# Patient Record
Sex: Female | Born: 1970 | Race: White | Hispanic: No | Marital: Married | Smoking: Never smoker
Health system: Southern US, Community
[De-identification: ages and names within clinical notes are randomized; demographics above are authoritative.]

## PROBLEM LIST (undated history)

## (undated) DIAGNOSIS — R112 Nausea with vomiting, unspecified: Secondary | ICD-10-CM

## (undated) DIAGNOSIS — Z8489 Family history of other specified conditions: Secondary | ICD-10-CM

## (undated) DIAGNOSIS — T8859XA Other complications of anesthesia, initial encounter: Secondary | ICD-10-CM

## (undated) DIAGNOSIS — Z973 Presence of spectacles and contact lenses: Secondary | ICD-10-CM

## (undated) DIAGNOSIS — K219 Gastro-esophageal reflux disease without esophagitis: Secondary | ICD-10-CM

## (undated) DIAGNOSIS — Z8719 Personal history of other diseases of the digestive system: Secondary | ICD-10-CM

## (undated) DIAGNOSIS — Z9889 Other specified postprocedural states: Secondary | ICD-10-CM

## (undated) DIAGNOSIS — T4145XA Adverse effect of unspecified anesthetic, initial encounter: Secondary | ICD-10-CM

## (undated) DIAGNOSIS — T753XXA Motion sickness, initial encounter: Secondary | ICD-10-CM

## (undated) DIAGNOSIS — D128 Benign neoplasm of rectum: Secondary | ICD-10-CM

## (undated) HISTORY — PX: SIGMOIDOSCOPY: SUR1295

## (undated) HISTORY — PX: TRANSANAL EXCISION OF RECTAL MASS: SHX6134

---

## 1998-07-30 ENCOUNTER — Inpatient Hospital Stay (HOSPITAL_COMMUNITY): Admission: AD | Admit: 1998-07-30 | Discharge: 1998-08-01 | Payer: Self-pay | Admitting: Obstetrics and Gynecology

## 1998-09-10 ENCOUNTER — Other Ambulatory Visit: Admission: RE | Admit: 1998-09-10 | Discharge: 1998-09-10 | Payer: Self-pay | Admitting: Obstetrics and Gynecology

## 2001-08-08 ENCOUNTER — Other Ambulatory Visit: Admission: RE | Admit: 2001-08-08 | Discharge: 2001-08-08 | Payer: Self-pay | Admitting: Gynecology

## 2001-12-05 ENCOUNTER — Observation Stay (HOSPITAL_COMMUNITY): Admission: AD | Admit: 2001-12-05 | Discharge: 2001-12-06 | Payer: Self-pay | Admitting: Gynecology

## 2001-12-06 ENCOUNTER — Encounter: Payer: Self-pay | Admitting: Gynecology

## 2002-01-14 ENCOUNTER — Inpatient Hospital Stay (HOSPITAL_COMMUNITY): Admission: AD | Admit: 2002-01-14 | Discharge: 2002-01-17 | Payer: Self-pay | Admitting: Gynecology

## 2002-01-18 ENCOUNTER — Encounter: Admission: RE | Admit: 2002-01-18 | Discharge: 2002-02-17 | Payer: Self-pay | Admitting: Gynecology

## 2002-02-18 ENCOUNTER — Encounter: Admission: RE | Admit: 2002-02-18 | Discharge: 2002-03-20 | Payer: Self-pay | Admitting: Gynecology

## 2002-03-01 ENCOUNTER — Other Ambulatory Visit: Admission: RE | Admit: 2002-03-01 | Discharge: 2002-03-01 | Payer: Self-pay | Admitting: Gynecology

## 2002-10-01 ENCOUNTER — Encounter (INDEPENDENT_AMBULATORY_CARE_PROVIDER_SITE_OTHER): Payer: Self-pay | Admitting: Specialist

## 2002-10-01 ENCOUNTER — Ambulatory Visit (HOSPITAL_COMMUNITY): Admission: RE | Admit: 2002-10-01 | Discharge: 2002-10-01 | Payer: Self-pay | Admitting: Gastroenterology

## 2003-04-18 ENCOUNTER — Ambulatory Visit (HOSPITAL_COMMUNITY): Admission: RE | Admit: 2003-04-18 | Discharge: 2003-04-18 | Payer: Self-pay | Admitting: Gastroenterology

## 2003-04-18 ENCOUNTER — Encounter (INDEPENDENT_AMBULATORY_CARE_PROVIDER_SITE_OTHER): Payer: Self-pay | Admitting: *Deleted

## 2003-06-12 ENCOUNTER — Encounter (INDEPENDENT_AMBULATORY_CARE_PROVIDER_SITE_OTHER): Payer: Self-pay | Admitting: *Deleted

## 2003-06-12 ENCOUNTER — Ambulatory Visit (HOSPITAL_COMMUNITY): Admission: RE | Admit: 2003-06-12 | Discharge: 2003-06-12 | Payer: Self-pay | Admitting: General Surgery

## 2003-10-01 ENCOUNTER — Ambulatory Visit (HOSPITAL_COMMUNITY): Admission: RE | Admit: 2003-10-01 | Discharge: 2003-10-01 | Payer: Self-pay | Admitting: Gastroenterology

## 2003-10-01 ENCOUNTER — Encounter (INDEPENDENT_AMBULATORY_CARE_PROVIDER_SITE_OTHER): Payer: Self-pay | Admitting: *Deleted

## 2004-04-12 ENCOUNTER — Ambulatory Visit (HOSPITAL_COMMUNITY): Admission: RE | Admit: 2004-04-12 | Discharge: 2004-04-12 | Payer: Self-pay | Admitting: Gastroenterology

## 2004-04-12 ENCOUNTER — Encounter (INDEPENDENT_AMBULATORY_CARE_PROVIDER_SITE_OTHER): Payer: Self-pay | Admitting: *Deleted

## 2004-05-24 ENCOUNTER — Ambulatory Visit (HOSPITAL_COMMUNITY): Admission: RE | Admit: 2004-05-24 | Discharge: 2004-05-24 | Payer: Self-pay | Admitting: General Surgery

## 2004-05-24 ENCOUNTER — Encounter (INDEPENDENT_AMBULATORY_CARE_PROVIDER_SITE_OTHER): Payer: Self-pay | Admitting: *Deleted

## 2004-10-27 ENCOUNTER — Ambulatory Visit (HOSPITAL_COMMUNITY): Admission: RE | Admit: 2004-10-27 | Discharge: 2004-10-27 | Payer: Self-pay | Admitting: Gastroenterology

## 2005-01-03 ENCOUNTER — Inpatient Hospital Stay (HOSPITAL_COMMUNITY): Admission: AD | Admit: 2005-01-03 | Discharge: 2005-01-03 | Payer: Self-pay | Admitting: Gynecology

## 2005-01-11 ENCOUNTER — Other Ambulatory Visit: Admission: RE | Admit: 2005-01-11 | Discharge: 2005-01-11 | Payer: Self-pay | Admitting: Gynecology

## 2005-04-27 ENCOUNTER — Encounter: Admission: RE | Admit: 2005-04-27 | Discharge: 2005-04-27 | Payer: Self-pay | Admitting: Obstetrics and Gynecology

## 2005-06-25 ENCOUNTER — Inpatient Hospital Stay (HOSPITAL_COMMUNITY): Admission: AD | Admit: 2005-06-25 | Discharge: 2005-06-25 | Payer: Self-pay | Admitting: Obstetrics & Gynecology

## 2005-07-08 ENCOUNTER — Inpatient Hospital Stay (HOSPITAL_COMMUNITY): Admission: AD | Admit: 2005-07-08 | Discharge: 2005-07-10 | Payer: Self-pay | Admitting: Obstetrics and Gynecology

## 2005-12-21 ENCOUNTER — Encounter (INDEPENDENT_AMBULATORY_CARE_PROVIDER_SITE_OTHER): Payer: Self-pay | Admitting: Specialist

## 2005-12-21 ENCOUNTER — Ambulatory Visit (HOSPITAL_COMMUNITY): Admission: RE | Admit: 2005-12-21 | Discharge: 2005-12-21 | Payer: Self-pay | Admitting: Gastroenterology

## 2006-08-07 ENCOUNTER — Ambulatory Visit (HOSPITAL_COMMUNITY): Admission: RE | Admit: 2006-08-07 | Discharge: 2006-08-07 | Payer: Self-pay | Admitting: Gastroenterology

## 2010-07-23 ENCOUNTER — Encounter: Admission: RE | Admit: 2010-07-23 | Discharge: 2010-07-23 | Payer: Self-pay | Admitting: Chiropractor

## 2011-03-11 NOTE — Op Note (Signed)
Angela Knight, Angela Knight                 ACCOUNT NO.:  [id number]   MEDICAL RECORD NO.:  [id number]          PATIENT TYPE:  AMB   LOCATION:  ENDO                         FACILITY:  [facility]   PHYSICIAN:  Anselmo Rod, M.D.  DATE OF BIRTH:  08-16-1971   DATE OF PROCEDURE:  08/07/2006  DATE OF DISCHARGE:                                 OPERATIVE REPORT   PROCEDURE PERFORMED:  Diagnostic flexible sigmoidoscopy.   ENDOSCOPIST:  Anselmo Rod, M.D.   INSTRUMENT USED:  Olympus video colonoscope.   INDICATION FOR PROCEDURE:  A 40 year old white female with a history of  tubulovillous adenoma.  The patient has had a transanal resection in the  past.  Rule out recurrent polyps, masses, etc.   PREPROCEDURE PREPARATION:  Informed consent was procured from the patient.  The patient was fasted for 4 hours prior to the procedure and prepped with a  bottle of magnesium citrate the night prior to the procedure.  The risks and  benefits of the procedure were discussed with her in great detail.   PREPROCEDURE PHYSICAL:  VITAL SIGNS:  The patient had stable vital signs.  NECK:  Supple.  CHEST:  Clear to auscultation.  S1, S2 regular.  ABDOMEN:  Soft with normal bowel sounds.   DESCRIPTION OF PROCEDURE:  The patient was placed in the left lateral  decubitus position.  The patient chose to have the procedure without  sedation.  Once she was adequately positioned, the Olympus video colonoscope  was advanced from the rectum to 90 cm without difficulty.  A scar was noted  at the anal verge from previous transanal resection.  The rest of the  colonic mucosa up to 90 cm appeared healthy.  There was solid stool beyond  this point, and therefore the procedure was completed at 90 cm.  No masses,  polyps, erosions, ulcerations or diverticula were seen.  Retroflexed view in  the rectum revealed no abnormalities except for the scar from the previous  surgery as mentioned above.   IMPRESSION:  Normal exam up to  90 cm except for a scar at the anal verge.  No masses, polyps, erosions, ulcerations or diverticula seen.   RECOMMENDATIONS:  1. Continue a high-fiber diet with liberal fluid intake.  2. Repeat flexible sigmoidoscopy in the next 6 months unless the patient      develops any abnormal symptoms prior to that, in which case she should      contact the office immediately for further recommendations.      Anselmo Rod, M.D.  Electronically Signed     JNM/MEDQ  D:  08/08/2006  T:  08/10/2006  Job:  [id number]   cc:   Marjory Lies, M.D.  Adolph Pollack, M.D.

## 2011-03-11 NOTE — Op Note (Signed)
NAME:  ANGALENA, COUSINEAU                           ACCOUNT NO.:  [id number]   MEDICAL RECORD NO.:  [id number]                   PATIENT TYPE:  AMB   LOCATION:  DAY                                  FACILITY:  [facility]   PHYSICIAN:  Adolph Pollack, M.D.            DATE OF BIRTH:  1971-04-15   DATE OF PROCEDURE:  06/12/2003  DATE OF DISCHARGE:                                 OPERATIVE REPORT   PREOPERATIVE DIAGNOSIS:  Tubular adenoma of the rectum.   POSTOPERATIVE DIAGNOSIS:  Tubular adenoma of the rectum.   OPERATION/PROCEDURE:  1. Transrectal ultrasound.  2. Transanal excision of polypoid rectal mass.   SURGEON:  Adolph Pollack, M.D.   ANESTHESIA:  General.   INDICATIONS:  Angela Knight is a 40 year old female who about seven months ago  underwent a transanal resection of tubular adenoma of the rectum  encompassing 25% of its circumference.  She had a six-month followup and was  noted to have a recurrence.  She now presents for transrectal ultrasound and  reexcision.   DESCRIPTION OF PROCEDURE:  She was seen in the holding area and brought to  the operating room supine on the stretcher. A general anesthetic was  administered and she was turned into the prone position on the operating  table with padding placed in the appropriate areas.  The buttocks were then  retracted with tape and she was placed in the jackknife position.  Perianal  area was then isolated.  Digital rectal exam was performed.  In the anterior  region I could feel the irregular mucosa of the rectum.  Using the rectal  probe, transrectal ultrasound was performed anteriorly from approximately  the 10 o'clock to 2 o'clock position.  In that area is an irregular mucosal-  type lesion.  It did not go deep to the mucosa.  I subsequently removed the  transrectal ultrasound probe.  I then sterilely prepped and draped the  perianal area.  Anoscope was introduced and a flat polypoid lesion was noted  from about the 10  o'clock to 2 o'clock position in spotty areas.  Using 0.5%  Marcaine with epinephrine, I anesthetized the submucosal area.  I then used  the cautery to perform excision of this area from the 10 o'clock to 1  o'clock position, repositoned the anoscope and excised the rest of the area  by way of mucosal excision.  I examined the area fairly thoroughly and saw  no other evidence of polypoid lesions.   Next, I controlled bleeding using electrocautery.  I then inspected the area  multiple time.  I injected more 0.5% Marcaine with epinephrine in the  submucosal region.  After five minutes of observation, hemostasis was noted  to be adequate.  I then placed a Gelfoam on the area.  A dry dressing was  placed over the rectum.   The specimen was sent to pathology.  She was then  placed supine on the  stretcher, extubated and taken to the recovery room in satisfactory  condition.  There are no apparent complications and she tolerated the  procedure well.   Postoperative instructions will be given to her as well as postoperative  pain medicine and medicine for nausea.  I will see her back in the office in  about two weeks.                                               Adolph Pollack, M.D.    Kari Baars  D:  06/12/2003  T:  06/12/2003  Job:  [id number]   cc:   Anselmo Rod, M.D.  [address]  [address]  [place]  [place] [address]  Fax: [phone]

## 2011-03-11 NOTE — Op Note (Signed)
   NAME:  Angela Knight, Angela Knight                           ACCOUNT NO.:  [id number]   MEDICAL RECORD NO.:  [id number]                   PATIENT TYPE:  AMB   LOCATION:  ENDO                                 FACILITY:  [facility]   PHYSICIAN:  Anselmo Rod, M.D.               DATE OF BIRTH:  03/05/1971   DATE OF PROCEDURE:  04/18/2003  DATE OF DISCHARGE:  04/18/2003                                 OPERATIVE REPORT   PROCEDURE PERFORMED:  Flexible sigmoidoscopy with biopsies.   ENDOSCOPIST:  Anselmo Rod, M.D.   INSTRUMENT USED:  Olympus videocolonoscope.   INDICATION FOR THE PROCEDURE:  History of a tubulovillous adenoma removed  transanally by Dr. Abbey Chatters in 1/04, rule out recurrent lesions, six-month  flexible sigmoidoscopy being done.   PREPROCEDURE PREPARATION:  Informed consent was procured from the patient.  The patient was fasted for eight hours prior to the procedure and prepped  with a bottle of MiraLax night prior to the procedure.   PREPROCEDURE PHYSICAL:  VITAL SIGNS:  Stable.  NECK:  Supple.  CHEST:  Clear to auscultation.  S1 and S2 regular.  ABDOMEN:  Soft with normal bowel sounds.   DESCRIPTION OF THE PROCEDURE:  The patient was placed in the left lateral  decubitus position.  No sedation was used.  Once the patient was adequately  positioned the Olympus videocolonoscope was advanced from the rectum to 50  cm without difficulty.  There was nodular mucosa seen at the anal verge.  It  was biopsied for pathology.  This may indicate recurrence of her  tubulovillous adenoma removed surgically less than six months ago.  The rest  of the mucosa up to 50 cm appeared normal.  Retroflexion revealed no  abnormalities.  The patient tolerated the procedure well without  complications.   IMPRESSION:  Irregular mucosa with nodularity noticed at anal verge,  biopsies done, question recurrent tubulovillous adenoma.    RECOMMENDATIONS:  Await pathology results, outpatient followup  within the  next week for further recommendations.                                               Anselmo Rod, M.D.    JNM/MEDQ  D:  04/18/2003  T:  04/20/2003  Job:  [id number]   cc:   Adolph Pollack, M.D.  [address]  [place]  [place] [address]  Fax: [phone]   Teena Irani. Arlyce Dice, M.D.  [address]  [place]  [place] [address]  Fax: [phone]

## 2011-03-11 NOTE — Op Note (Signed)
NAME:  Angela Knight, Angela Knight                           ACCOUNT NO.:  [id number]   MEDICAL RECORD NO.:  [id number]                   PATIENT TYPE:  AMB   LOCATION:  DAY                                  [facility]ACILITY:  [facility]   PHYSICIAN:  Adolph Pollack, M.D.            DATE OF BIRTH:  04/13/1971   DATE OF PROCEDURE:  05/24/2004  DATE OF DISCHARGE:                                 OPERATIVE REPORT   PREOPERATIVE DIAGNOSIS:  Tubulovillous rectal adenoma.   POSTOPERATIVE DIAGNOSIS:  Tubulovillous rectal adenoma.   PROCEDURE:  Transanal excision of tubulovillous rectal adenoma.   SURGEON:  Adolph Pollack, M.D.   ANESTHESIA:  General plus local.   INDICATIONS:  Ms. Beauchesne is a 40 year old female, status post excision of  tubulovillous adenoma of the rectum approximately one year ago.  A repeat  sigmoidoscopy was performed here recently, and a small lesion was seen at  the anal verge.  I saw her in the office, and on anoscopy, she has another  villous-appearing adenoma at the anal verge, more distal than the others  that she has had in the past.  She now presents for excision.  The procedure  and risks were discussed with her preoperatively.   TECHNIQUE:  She is seen in the holding area and then brought to the  operating room.  Placed supine on the operating room table.  General  anesthetic was administered.  She was then placed in the lithotomy position.  The perianal area was sterilely prepped and draped.  Digital rectal exam was  performed, and I could feel the irregularity at the 9 and 10 o'clock  position.  I then introduced an anoscope and injected some local anesthetic  into this region, consisting of 0.5% Marcaine with epinephrine.  I was able  to grasp the polyp with Allis forceps and using electrocautery, I excised  what appeared to be about a 2 cm polyp.  I took the mucosa and some of the  submucosa.  I then looked at the periphery and took off some of the mucosa  in the  periphery around the polyp all the way down to the anal verge region  and up into the rectal area.  I sent all of these specimens to pathology.   I then inspected the area, and bleeding points were controlled with  electrocautery.  I injected more of a local anesthetic from the 6 o'clock  and 12 o'clock positions superficially and deep.  I then placed a piece of  gel foam over the excision area.  Hemostasis was adequate.  A bulky dressing  was then applied.   She tolerated the procedure well without any apparent complications and was  taken to the recovery room in satisfactory condition.  We have discussed the  perioperative and postoperative care preoperatively.  We will send her home  with Ultram for pain and have her come back and see me  in 1-2 weeks.                                               Adolph Pollack, M.D.    Kari Baars  D:  05/24/2004  T:  05/24/2004  Job:  [id number]   cc:   Anselmo Rod, M.D.  [address]  [address]  [place]  [place] [address]  Fax: [phone]

## 2011-03-11 NOTE — Op Note (Signed)
NAME:  Angela Knight, Angela Knight                           ACCOUNT NO.:  [id number]   MEDICAL RECORD NO.:  [id number]                   PATIENT TYPE:  AMB   LOCATION:  ENDO                                 FACILITY:  [facility]   PHYSICIAN:  Anselmo Rod, M.D.               DATE OF BIRTH:  Jun 01, 1971   DATE OF PROCEDURE:  10/01/2003  DATE OF DISCHARGE:  10/01/2003                                 OPERATIVE REPORT   PROCEDURE:  Flexible sigmoidoscopy with biopsies.   ENDOSCOPIST:  Anselmo Rod, M.D.   INSTRUMENT USED:  Olympus video colonoscope.   INDICATIONS FOR PROCEDURE:  A 40 year old white female with a history of  tubular adenoma removed by Dr. Abbey Chatters from the rectum earlier this year,  undergoing a repeat flexible sigmoidoscopy to rule out recurrence.   PREPROCEDURE PREPARATION:  Informed consent was procured from the patient.  The patient was fasted for eight hours prior to the procedure and prepped  with a bottle of MiraLax the night prior to the procedure.   PREPROCEDURE PHYSICAL EXAMINATION:  VITAL SIGNS:  Stable.  NECK:  Supple.  CHEST:  Clear to auscultation.  S1 and S2 regular.  ABDOMEN:  Soft with normal bowel sounds.   DESCRIPTION OF PROCEDURE:  The patient was placed in the left lateral  decubitus position and no sedation was used.  Once the patient was  adequately positioned, the Olympus video colonoscope was advanced from the  rectum to 40 cm without difficulty.  The patient had a well-healed scar to  the anal verge.  A small nodular area was identified close to the anal  verge.  This was probably granulation tissue.  This was biopsied for  pathology.  Retroflexion showed mild irregularity above the anal verge which  may represent residual adenomatous tissue.  This was biopsied for pathology  as well.  The patient had no other abnormalities noted and the entire rectal  mucosa appeared healthy except for the small nodularity at the anal verge  close to the surgical scar  where biopsies were done.  The patient tolerated  the procedure well without complications.   IMPRESSION:  Small patch of irregular mucosa and nodularity at site of  previous surgery.  Rule out recurrent adenoma.  Biopsies done and results  pending.   RECOMMENDATIONS:  Await pathology results.  Repeat flexible sigmoidoscopy  and possible Argon ablation if the patient has recurrence of an adenomatous  polyp.                                               Anselmo Rod, M.D.    JNM/MEDQ  D:  10/02/2003  T:  10/03/2003  Job:  [id number]   cc:   Adolph Pollack, M.D.  [address]  [place]  [place] [address]  Fax: [phone]   Elizabeth Palau, F.N.P.   Timothy P. Audie Box, M.D.  [address]  [place]  [place] [address]  Fax: [phone]

## 2011-03-11 NOTE — Op Note (Signed)
NAME:  CAM, HARNDEN                           ACCOUNT NO.:  [id number]   MEDICAL RECORD NO.:  [id number]                   PATIENT TYPE:  AMB   LOCATION:  ENDO                                 FACILITY:  [facility]   PHYSICIAN:  Anselmo Rod, M.D.               DATE OF BIRTH:  31-Dec-1970   DATE OF PROCEDURE:  10/02/2002  DATE OF DISCHARGE:                                 OPERATIVE REPORT   PROCEDURE PERFORMED:  Colonoscopy with snare polypectomy times one and cold  biopsies times 10.   ENDOSCOPIST:  Charna Elizabeth, M.D.   INSTRUMENT USED:  Olympus adjustable pediatric colonoscope.   INDICATIONS FOR PROCEDURE:  The patient is a 40 year old white female with a  history of change in bowel habits and rectal bleeding.  Rule out  inflammatory bowel disease, polyps, masses, etc.   PREPROCEDURE PREPARATION:  Informed consent was procured from the patient.  The patient was fasted for eight hours prior to the procedure and prepped  with a bottle of magnesium citrate and a gallon of NuLytely the night prior  to the procedure.   PREPROCEDURE PHYSICAL:  The patient had stable vital signs.  Neck supple.  Chest clear to auscultation.  S1 and S2 regular.  Abdomen soft with normal  bowel sounds.   DESCRIPTION OF PROCEDURE:  The patient was placed in left lateral decubitus  position and sedated with 30 mg of Demerol and 2 mg of Versed intravenously.  Once the patient was adequately sedated and maintained on low flow oxygen  and continuous cardiac monitoring, the Olympus video colonoscope was  advanced from the rectum to the cecum and terminal ileum without difficulty.  A nodular mass was seen at the anal verge.  This was biopsied for pathology.  A small polyp was seen close to the nodular mass.  This was removed by snare  polypectomy.  The appearance of his nodular area could be akin to a villous  adenoma.  The rest of the colonic mucosa up to the terminal ileum appeared  normal.  The mass in the  rectum was at the anal verge.  The patient  tolerated the procedure well without complications.   IMPRESSION:  1. Nodular lesion at the anal verge with a small polyp close to it.     Biopsies done from the nodular mass and polypectomy done of small polyp     present near the nodular mass.  2. Otherwise normal-appearing colon up to the terminal ileum.    RECOMMENDATIONS:  1. Await pathology results.  2. Avoid all nonsteroidals including aspirin for now.  3. Possible surgical evaluation depending on the pathology results.  Anselmo Rod, M.D.    JNM/MEDQ  D:  10/02/2002  T:  10/02/2002  Job:  [id number]   cc:   Marcial Pacas P. Audie Box, M.D.  [address], [address]  [place]  [place] [address]  Fax: [phone]

## 2011-03-11 NOTE — Op Note (Signed)
NAME:  Angela Knight, Angela Knight                 ACCOUNT NO.:  [id number]   MEDICAL RECORD NO.:  [id number]          PATIENT TYPE:  AMB   LOCATION:  ENDO                         FACILITY:  [facility]   PHYSICIAN:  Anselmo Rod, M.D.  DATE OF BIRTH:  26-Apr-1971   DATE OF PROCEDURE:  10/27/2004  DATE OF DISCHARGE:                                 OPERATIVE REPORT   PROCEDURE PERFORMED:  Flexible sigmoidoscopy.   ENDOSCOPIST:  Anselmo Rod, M.D.   INSTRUMENT USED:  Olympus video colonoscope.   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  The patient  had transanal excision by Dr. Abbey Chatters earlier last year and is undergoing  a repeat flexible sigmoidoscopy.   PRE-PROCEDURE PREPARATION:  Informed consent was procured from the patient.  The patient was fasted for eight hours prior to the procedure and prepped  with a bottle of MiraLax the night prior to the procedure.   PHYSICAL EXAMINATION:  VITAL SIGNS:  Stable.  NECK:  Supple.  CHEST:  Clear to auscultation.  CARDIAC:  S1, S2 regular.  ABDOMEN:  Soft, with normal bowel sounds.   DESCRIPTION OF THE PROCEDURE:  The patient was placed in the left lateral  decubitus position.  No sedation was used, as the patient opted to have the  procedure without sedation.  Therefore, no cardiac monitoring or oxygen was  used.  Once the patient was positioned on the left lateral side, the Olympus  video colonoscope was advanced into the rectum to 20 cm without difficulty.  There was no evidence of a villous adenoma or abnormalities in the rectum.  The mucosa appeared healthy, with no nodularity or evidence of a polyp.  The  mucosa up to 20 cm appeared normal.  Retroflexion in the rectum revealed a  healthy dentate line, with no evidence of recurrence.   IMPRESSION:  Normal exam up to 20 cm.   RECOMMENDATIONS:  Repeat flexible sigmoidoscopy has been advised in the  next  six months or earlier if need arises.  The patient should call the office if  she has any abnormal symptoms.      Jyot   JNM/MEDQ  D:  10/28/2004  T:  10/28/2004  Job:  [id number]   cc:   Adolph Pollack, M.D.  [address]  [place]  [place] [address]  Fax: [phone]   Nadyne Coombes. Fontaine, M.D.  Fax: [phone]

## 2011-03-11 NOTE — Op Note (Signed)
NAME:  Angela Knight, Angela Knight                           ACCOUNT NO.:  [id number]   MEDICAL RECORD NO.:  [id number]                   PATIENT TYPE:  AMB   LOCATION:  ENDO                                 FACILITY:  [facility]   PHYSICIAN:  Anselmo Rod, M.D.               DATE OF BIRTH:  1971-05-17   DATE OF PROCEDURE:  04/12/2004  DATE OF DISCHARGE:                                 OPERATIVE REPORT   PROCEDURE:  Flexible sigmoidoscopy with biopsies.   ENDOSCOPIST:  Charna Elizabeth, M.D.   INSTRUMENT USED:  Olympus video colonoscope.   INDICATIONS FOR PROCEDURE:  40 year old white female with a history of  tubular villous adenoma in the rectum undergoing a repeat flexible  sigmoidoscopy to rule out recurrence.   PREPROCEDURE PREPARATION:  Informed consent was obtained from the patient.  The patient was fasted for eight hours prior to the procedure and prepped  with a bottle of MiraLax the night prior to the procedure.   PREPROCEDURE PHYSICAL:  Patient with stable vital signs.  Neck supple.  Chest clear to auscultation.  S1 and S2 regular.  Abdomen soft with normal  bowel sounds.   DESCRIPTION OF PROCEDURE:  The patient was placed in the left lateral  decubitus position, no sedation was used.  Once the patient was adequately  positioned, the Olympus video colonoscope was advanced into the rectum to 25  cm without difficulty.  The patient had an excellent prep.  A small nodular  lesion was seen at the anal verge which was biopsied for pathology.  The  patient experienced some pain with the biopsies and, therefore, no further  intervention was undertaken.  Retroflexion in the rectum revealed small  nodular lesion at the verge.  The patient tolerated the procedure well  without immediate complications.   IMPRESSION:  Small nodular lesion at the anal verge, biopsies done, results  pending.   RECOMMENDATIONS:  1. Await pathology results, further recommendations made after the results     have been  procured.  2. Avoid nonsteroidals for now.                                               Anselmo Rod, M.D.    JNM/MEDQ  D:  04/12/2004  T:  04/12/2004  Job:  [id number]   cc:   Marcial Pacas P. Audie Box, M.D.  [address]  [place]  [place] [address]  Fax: [phone]   Adolph Pollack, M.D.  [address]  [place]  [place] [address]  Fax: [phone]

## 2011-03-11 NOTE — Op Note (Signed)
NAMESILA, SARSFIELD                 ACCOUNT NO.:  [id number]   MEDICAL RECORD NO.:  [id number]          PATIENT TYPE:  AMB   LOCATION:  ENDO                         FACILITY:  [facility]   PHYSICIAN:  Anselmo Rod, M.D.  DATE OF BIRTH:  April 10, 1971   DATE OF PROCEDURE:  12/21/2005  DATE OF DISCHARGE:  12/21/2005                                 OPERATIVE REPORT   PROCEDURE:  Flexible sigmoidoscopy up to 20 cm with snare polypectomy x2.   ENDOSCOPIST:  Anselmo Rod, M.D.   INSTRUMENT USED:  Olympus videocolonoscope.   INDICATIONS FOR PROCEDURE:  The patient is a 40 year old white female with a  history of tubulovillous adenomas removed from the rectum in the past,  undergoing a repeat flexible sigmoidoscopy for surveillance purposes and  rule out recurrent polyps, etc.   PREPROCEDURE PREPARATION:  Informed consent was procured from the patient.  The patient was fasted for four hours prior to the procedure and prepped  with MiraLax the night prior to the procedure.  The risks and benefits of  the procedure were discussed with her in great detail.   PREPROCEDURE PHYSICAL EXAMINATION:  VITAL SIGNS:  The patient had stable  vital signs.  NECK:  Supple.  CHEST:  Clear to auscultation.  S1 and S2 regular.  ABDOMEN:  Soft with normal bowel sounds.   DESCRIPTION OF PROCEDURE:  The patient was placed in the left lateral  decubitus position.  She chose to do the procedure without sedation.  Once  the patient was adequately positioned, the Olympus videocolonoscope was  advanced from the rectum to 20 cm without difficulty.  Two well-healed scars  were seen at the anal verge from previous polypectomy/surgery for a  transanal excision of a rectal lesion.  Two nodular lesions were snared from  close to the anal verge.  These were better appreciated on retroflexion in  the rectum.  The rest of the exam was unremarkable.   IMPRESSION:  Two nodular lesions removed by snare polypectomy from the  anal  verge.  Otherwise, unremarkable exam up to 20 cm.   RECOMMENDATIONS:  1.  Await pathology results.  2.  Avoid all nonsteroidals for now.  3.  Further recommendations depending on pathology results.      Anselmo Rod, M.D.  Electronically Signed     JNM/MEDQ  D:  12/22/2005  T:  12/23/2005  Job:  [id number]   cc:   Marjory Lies, M.D.  Fax: [phone]   Adolph Pollack, M.D.  [address]  [place]  [place] [address]

## 2011-03-11 NOTE — H&P (Signed)
Angela Knight, Angela Knight                 ACCOUNT NO.:  [id number]   MEDICAL RECORD NO.:  [id number]          PATIENT TYPE:  INP   LOCATION:  9162                          FACILITY:  [facility]   PHYSICIAN:  Lovenia Kim, M.D.DATE OF BIRTH:  1971-06-29   DATE OF ADMISSION:  07/08/2005  DATE OF DISCHARGE:                                HISTORY & PHYSICAL   HISTORY OF PRESENT ILLNESS:  The patient is a 40 year old white female G8,  P5, who presents at [redacted] weeks gestation in active labor.   MEDICATIONS:  Zofran and stool softener.   ALLERGIES:  Codeine, Sudafed, and Monistat.   Prenatal lab data:  Blood type O positive, Rubella immune, hepatitis and  HIV nonreactive.   Pregnancy history remarkable for five vaginal deliveries with two  pregnancies lost.   FAMILY HISTORY:  Bipolar disorder, prostate and skin cancer, and heart  disease.   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   PHYSICAL EXAMINATION:  GENERAL:  She is a well developed, well nourished white female in no acute  distress.  HEENT:  Normal.  LUNGS:  Clear.  HEART:  Regular rate and rhythm.  ABDOMEN:  Soft, gravid, nontender, estimated fetal weight about 8 pounds.   The cervix is 4-5 cm, 80% effaced, vertex 0 station.   IMPRESSION:  1.  38 week OB.  2.  Glioblastoma multiforme, stable.  3.  Active labor.   PLAN:  Artificial rupture of membranes, epidural, attempt at vaginal  delivery.     Lovenia Kim, M.D.  Electronically Signed    RJT/MEDQ  D:  07/08/2005  T:  07/08/2005  Job:  [id number]

## 2011-03-11 NOTE — Discharge Summary (Signed)
[facility]  Patient:    Angela Knight, Angela Knight Visit Number: [id number] MRN: [id number]          Service Type: OBS Location: 910A 9114 [id number] Attending Physician:  Merrily Pew Dictated by:   Antony Contras, [facility] Admit Date:  01/14/2002 Discharge Date: 01/17/2002                             Discharge Summary  DISCHARGE DIAGNOSES:          1. Intrauterine pregnancy at 37 weeks.                               2. Spontaneous onset of labor.  PROCEDURE:                    Normal spontaneous vaginal delivery of viable infant over intact perineum with repair of first to second degree vaginal perineal tear.  HISTORY OF PRESENT ILLNESS:   The patient is a 40 year old, gravida 9, para 4-0-2-4, LMP April 29, 2001, [facility] February 03, 2002.  Prenatal risk factors include previous history of GDM, positive beta strep in previous pregnancies.  PRENATAL LABORATORY DATA:     Blood type O positive, antibody screen negative. RPR, HBSAG, and HIV nonreactive.  HOSPITAL COURSE:              The patient was admitted on January 15, 2002, with spontaneous onset of labor at 36 weeks.  Cervix was 2 to 3 cm, vertex -2 station. The patient progressed to complete dilatation and delivered an Apgars 8 and 37 female infant weighing 7 pounds 4 ounces over intact perineum with repair of first to second degree vaginal perineal tear.  Postpartum course, she remained afebrile, no difficulty voiding, and was able to be discharged in satisfactory condition on her second postpartum day.  CBC; hematocrit 30.7, hemoglobin 10.6, WBC 11.7, and platelets 252.  DISPOSITION:                  Follow up in the office in six weeks.  Continue with prenatal vitamins and iron.  Motrin for pain. Dictated by:   Antony Contras, [facility] Attending Physician:  Merrily Pew DD:  02/11/02 TD:  02/11/02 Job: [id number]

## 2013-04-13 ENCOUNTER — Emergency Department (HOSPITAL_COMMUNITY)
Admission: EM | Admit: 2013-04-13 | Discharge: 2013-04-13 | Disposition: A | Discharge status: Home / Self Care | Payer: 59 | Attending: Emergency Medicine | Admitting: Emergency Medicine

## 2013-04-13 ENCOUNTER — Encounter (HOSPITAL_COMMUNITY): Payer: Self-pay

## 2013-04-13 ENCOUNTER — Emergency Department (HOSPITAL_COMMUNITY): Payer: 59

## 2013-04-13 DIAGNOSIS — S99919A Unspecified injury of unspecified ankle, initial encounter: Secondary | ICD-10-CM | POA: Insufficient documentation

## 2013-04-13 DIAGNOSIS — M25562 Pain in left knee: Secondary | ICD-10-CM

## 2013-04-13 DIAGNOSIS — Y9389 Activity, other specified: Secondary | ICD-10-CM | POA: Insufficient documentation

## 2013-04-13 DIAGNOSIS — Y92009 Unspecified place in unspecified non-institutional (private) residence as the place of occurrence of the external cause: Secondary | ICD-10-CM | POA: Insufficient documentation

## 2013-04-13 DIAGNOSIS — W108XXA Fall (on) (from) other stairs and steps, initial encounter: Secondary | ICD-10-CM | POA: Insufficient documentation

## 2013-04-13 DIAGNOSIS — W19XXXA Unspecified fall, initial encounter: Secondary | ICD-10-CM

## 2013-04-13 DIAGNOSIS — Z23 Encounter for immunization: Secondary | ICD-10-CM | POA: Insufficient documentation

## 2013-04-13 DIAGNOSIS — S81012A Laceration without foreign body, left knee, initial encounter: Secondary | ICD-10-CM

## 2013-04-13 DIAGNOSIS — S8990XA Unspecified injury of unspecified lower leg, initial encounter: Secondary | ICD-10-CM | POA: Insufficient documentation

## 2013-04-13 DIAGNOSIS — S2220XA Unspecified fracture of sternum, initial encounter for closed fracture: Secondary | ICD-10-CM | POA: Insufficient documentation

## 2013-04-13 LAB — BASIC METABOLIC PANEL
Calcium: 10.1 mg/dL (ref 8.4–10.5)
Chloride: 101 mEq/L (ref 96–112)
Creatinine, Ser: 0.73 mg/dL (ref 0.50–1.10)
GFR calc Af Amer: >90 mL/min (ref >90)
Sodium: 135 mEq/L (ref 135–145)

## 2013-04-13 LAB — CBC WITH DIFFERENTIAL/PLATELET
Basophils Absolute: 0 10*3/uL (ref 0.0–0.1)
Basophils Relative: 0 % (ref 0–1)
HCT: 41.9 % (ref 36.0–46.0)
MCHC: 35.3 g/dL (ref 30.0–36.0)
Monocytes Absolute: 0.8 10*3/uL (ref 0.1–1.0)
Neutro Abs: 5.4 10*3/uL (ref 1.7–7.7)
Platelets: 259 10*3/uL (ref 150–400)
RDW: 13.3 % (ref 11.5–15.5)

## 2013-04-13 MED ORDER — TETANUS-DIPHTH-ACELL PERTUSSIS 5-2.5-18.5 LF-MCG/0.5 IM SUSP
0.5000 mL | Freq: Once | INTRAMUSCULAR | Status: AC
  Administered 2013-04-13: 0.5 mL via INTRAMUSCULAR
  Filled 2013-04-13: qty 0.5

## 2013-04-13 MED ORDER — ACETAMINOPHEN 325 MG PO TABS
650.0000 mg | ORAL_TABLET | Freq: Once | ORAL | Status: AC
  Administered 2013-04-13: 650 mg via ORAL
  Filled 2013-04-13: qty 2

## 2013-04-13 MED ORDER — IBUPROFEN 400 MG PO TABS
800.0000 mg | ORAL_TABLET | Freq: Once | ORAL | Status: DC
  Filled 2013-04-13: qty 1

## 2013-04-13 MED ORDER — IBUPROFEN 800 MG PO TABS: 800.0000 mg | ORAL_TABLET | Freq: Three times a day (TID) | ORAL | Status: DC

## 2013-04-13 NOTE — ED Notes (Signed)
Pt states that she took 600mg  of ibuprofen around 7pm tonight. Requested tylenol instead.

## 2013-04-13 NOTE — ED Provider Notes (Signed)
Medical screening examination/treatment/procedure(s) were performed by non-physician practitioner and as supervising physician I was immediately available for consultation/collaboration.  Ethelda Chick, MD 04/13/13 [phone]

## 2013-04-13 NOTE — ED Provider Notes (Signed)
History  This chart was scribed for non-physician practitioner working with Ethelda Chick, MD by Greggory Stallion, ED scribe. This patient was seen in room TR07C/TR07C and the patient's care was started at 8:41 PM.  CSN: [id number]  Arrival date & time 04/13/13  2008    Chief Complaint  Patient presents with  . Knee Injury    The history is provided by the patient. No language interpreter was used.    HPI Comments: ARIEONA SWAGGERTY is a 42 y.o. female who presents to the Emergency Department complaining of left knee pain following a fall prior to arrival. Patient states she was carrying groceries into her house and she lost her footing and fell forward onto the stairs. Denies any head trauma or loss of consciousness. Pain and swelling of left knee began immediately following injury. She has a small laceration to her anterior left knee, and several abrasions to her right posterior calf.  Denies any numbness or paresthesias of lower extremities.  Patient has severe allergies to all pain medications, she is only able to tolerate ibuprofen and Tylenol which she has taken prior to arrival with mild relief.  No prior left knee injury.  Pt able to bear minimal weight at this time.  History reviewed. No pertinent past medical history.  History reviewed. No pertinent past surgical history.  No family history on file.  History  Substance Use Topics  . Smoking status: Never Smoker   . Smokeless tobacco: Not on file  . Alcohol Use: No    OB History   Grav Para Term Preterm Abortions TAB SAB Ect Mult Living                  Review of Systems  Musculoskeletal: Positive for arthralgias.  All other systems reviewed and are negative.    Allergies  Review of patient's allergies indicates not on file.  Home Medications  No current outpatient prescriptions on file.  BP 137/77  Pulse 74  Temp(Src) 99.6 F (37.6 C) (Oral)  Resp 16  SpO2 97%  LMP 03/18/2013  Physical Exam  Nursing note  and vitals reviewed. Constitutional: She is oriented to person, place, and time. She appears well-developed and well-nourished.  HENT:  Head: Normocephalic and atraumatic.  Eyes: Conjunctivae and EOM are normal.  Neck: Normal range of motion. Neck supple.  Cardiovascular: Normal rate, regular rhythm and normal heart sounds.   Pulmonary/Chest: Effort normal and breath sounds normal.  Musculoskeletal:       Left knee: She exhibits decreased range of motion, swelling, ecchymosis, laceration and bony tenderness. She exhibits no effusion, no deformity and no erythema. Tenderness found. Patellar tendon tenderness noted. No medial joint line and no lateral joint line tenderness noted.       Legs: Small 1cm horizontal laceration of left anterior knee, no FB or signs of infection; inferior patella TTP with associated swelling and bruising, compartment soft No TTP of left anterior tib/fib or posterior calf Small abrasions to right posterior calf, no FB or signs of infection  Neurological: She is alert and oriented to person, place, and time.  Skin: Skin is warm and dry.  Psychiatric: She has a normal mood and affect.    ED Course  Procedures (including critical care time)  LACERATION REPAIR Performed by: Garlon Hatchet Authorized by: Garlon Hatchet Consent: Verbal consent obtained. Risks and benefits: risks, benefits and alternatives were discussed Consent given by: patient Patient identity confirmed: provided demographic data Prepped and Draped in  normal sterile fashion Wound explored  Laceration Location: left knee  Laceration Length: 1cm  No Foreign Bodies seen or palpated  Anesthesia: local infiltration  Local anesthetic: none  Anesthetic total: none  Irrigation method: syringe Amount of cleaning: standard  Skin closure: dermabond  Number of sutures: n/a  Technique: n/a  Patient tolerance: Patient tolerated the procedure well with no immediate  complications.   DIAGNOSTIC STUDIES: Oxygen Saturation is 97% on RA, normal by my interpretation.    COORDINATION OF CARE: 8:42 PM-Discussed treatment plan with pt at bedside and pt agreed to plan.   Labs Reviewed  BASIC METABOLIC PANEL - Abnormal; Notable for the following:    Glucose, Bld 114 (*)    All other components within normal limits  CBC WITH DIFFERENTIAL   Dg Knee Complete 4 Views Left  04/13/2013   *RADIOLOGY REPORT*  Clinical Data: Larey Seat.  Injured knee.  LEFT KNEE - COMPLETE 4+ VIEW  Comparison: None  Findings: The joint spaces are maintained.  No acute fracture or osteochondral abnormality.  No obvious joint effusion.  Anterior soft tissue swelling is noted with air in the subcutaneous fat probably related to a laceration.  IMPRESSION: No acute bony findings.   Original Report Authenticated By: Rudie Meyer, M.D.     1. Fall at home, initial encounter   2. Knee pain, acute, left   3. Laceration of left knee without complication       MDM   X-ray negative for acute fx or dislocation.  Laceration repaired with dermabond as above.  Tetanus updated.  Knee sleeve given, pt has crutches at home that she may use.  Pt is established with orthopedics at [facility] that she will FU with if sx not improving in the next few days.  Rx ibuprofen (pt allergic to all others).  Discussed plan with pt, she agreed.  Return precautions advised.   I personally performed the services described in this documentation, which was scribed in my presence. The recorded information has been reviewed and is accurate.   Garlon Hatchet, PA-C 04/13/13 [id number]

## 2014-05-23 ENCOUNTER — Ambulatory Visit
Admission: RE | Admit: 2014-05-23 | Discharge: 2014-05-23 | Disposition: A | Discharge status: Home / Self Care | Payer: 59 | Source: Ambulatory Visit | Attending: Family Medicine | Admitting: Family Medicine

## 2014-05-23 ENCOUNTER — Other Ambulatory Visit: Payer: Self-pay | Admitting: Physician Assistant

## 2014-05-23 ENCOUNTER — Other Ambulatory Visit: Payer: Self-pay | Admitting: Family Medicine

## 2014-05-23 DIAGNOSIS — M549 Dorsalgia, unspecified: Secondary | ICD-10-CM

## 2016-03-17 ENCOUNTER — Other Ambulatory Visit: Payer: Self-pay | Admitting: Gastroenterology

## 2016-03-17 DIAGNOSIS — R1310 Dysphagia, unspecified: Secondary | ICD-10-CM

## 2016-03-17 DIAGNOSIS — R1319 Other dysphagia: Secondary | ICD-10-CM

## 2016-03-18 ENCOUNTER — Ambulatory Visit
Admission: RE | Admit: 2016-03-18 | Discharge: 2016-03-18 | Disposition: A | Discharge status: Home / Self Care | Payer: 59 | Source: Ambulatory Visit | Attending: Gastroenterology | Admitting: Gastroenterology

## 2016-03-18 DIAGNOSIS — R1310 Dysphagia, unspecified: Secondary | ICD-10-CM

## 2016-03-18 DIAGNOSIS — R1319 Other dysphagia: Secondary | ICD-10-CM

## 2016-03-22 ENCOUNTER — Other Ambulatory Visit: Payer: Self-pay | Admitting: Gastroenterology

## 2016-04-22 ENCOUNTER — Encounter (HOSPITAL_COMMUNITY): Payer: Self-pay | Admitting: *Deleted

## 2016-05-02 ENCOUNTER — Ambulatory Visit (HOSPITAL_COMMUNITY): Payer: 59 | Admitting: Registered Nurse

## 2016-05-02 ENCOUNTER — Encounter (HOSPITAL_COMMUNITY)
Admission: RE | Disposition: A | Discharge status: Home / Self Care | Payer: Self-pay | Source: Ambulatory Visit | Attending: Gastroenterology

## 2016-05-02 ENCOUNTER — Encounter (HOSPITAL_COMMUNITY): Payer: Self-pay

## 2016-05-02 ENCOUNTER — Ambulatory Visit (HOSPITAL_COMMUNITY)
Admission: RE | Admit: 2016-05-02 | Discharge: 2016-05-02 | Disposition: A | Discharge status: Home / Self Care | Payer: 59 | Source: Ambulatory Visit | Attending: Gastroenterology | Admitting: Gastroenterology

## 2016-05-02 DIAGNOSIS — K222 Esophageal obstruction: Secondary | ICD-10-CM | POA: Diagnosis not present

## 2016-05-02 DIAGNOSIS — K219 Gastro-esophageal reflux disease without esophagitis: Secondary | ICD-10-CM | POA: Diagnosis not present

## 2016-05-02 DIAGNOSIS — K317 Polyp of stomach and duodenum: Secondary | ICD-10-CM | POA: Insufficient documentation

## 2016-05-02 DIAGNOSIS — Z86010 Personal history of colonic polyps: Secondary | ICD-10-CM | POA: Insufficient documentation

## 2016-05-02 DIAGNOSIS — E669 Obesity, unspecified: Secondary | ICD-10-CM | POA: Insufficient documentation

## 2016-05-02 DIAGNOSIS — Z1211 Encounter for screening for malignant neoplasm of colon: Secondary | ICD-10-CM | POA: Diagnosis not present

## 2016-05-02 DIAGNOSIS — R1319 Other dysphagia: Secondary | ICD-10-CM | POA: Diagnosis present

## 2016-05-02 DIAGNOSIS — D129 Benign neoplasm of anus and anal canal: Secondary | ICD-10-CM | POA: Diagnosis not present

## 2016-05-02 HISTORY — DX: Nausea with vomiting, unspecified: Z98.890

## 2016-05-02 HISTORY — DX: Gastro-esophageal reflux disease without esophagitis: K21.9

## 2016-05-02 HISTORY — DX: Other specified postprocedural states: R11.2

## 2016-05-02 HISTORY — PX: ESOPHAGOGASTRODUODENOSCOPY (EGD) WITH PROPOFOL: SHX5813

## 2016-05-02 HISTORY — PX: COLONOSCOPY WITH PROPOFOL: SHX5780

## 2016-05-02 SURGERY — COLONOSCOPY WITH PROPOFOL
Anesthesia: Monitor Anesthesia Care

## 2016-05-02 MED ORDER — ONDANSETRON HCL 4 MG/2ML IJ SOLN
INTRAMUSCULAR | Status: DC | PRN
  Administered 2016-05-02: 4 mg via INTRAVENOUS

## 2016-05-02 MED ORDER — DEXAMETHASONE SODIUM PHOSPHATE 10 MG/ML IJ SOLN
INTRAMUSCULAR | Status: AC
  Filled 2016-05-02: qty 1

## 2016-05-02 MED ORDER — PROPOFOL 10 MG/ML IV BOLUS
INTRAVENOUS | Status: AC
  Filled 2016-05-02: qty 20

## 2016-05-02 MED ORDER — GLYCOPYRROLATE 0.2 MG/ML IJ SOLN
INTRAMUSCULAR | Status: AC
  Filled 2016-05-02: qty 1

## 2016-05-02 MED ORDER — LACTATED RINGERS IV SOLN
INTRAVENOUS | Status: DC | PRN
  Administered 2016-05-02: 13:00:00 via INTRAVENOUS

## 2016-05-02 MED ORDER — LIDOCAINE HCL (CARDIAC) 20 MG/ML IV SOLN
INTRAVENOUS | Status: AC
  Filled 2016-05-02: qty 5

## 2016-05-02 MED ORDER — DEXAMETHASONE SODIUM PHOSPHATE 10 MG/ML IJ SOLN
INTRAMUSCULAR | Status: DC | PRN
  Administered 2016-05-02: 10 mg via INTRAVENOUS

## 2016-05-02 MED ORDER — PROPOFOL 500 MG/50ML IV EMUL: INTRAVENOUS | Status: DC | PRN

## 2016-05-02 MED ORDER — LIDOCAINE HCL (CARDIAC) 20 MG/ML IV SOLN
INTRAVENOUS | Status: DC | PRN
  Administered 2016-05-02: 100 mg via INTRAVENOUS

## 2016-05-02 MED ORDER — PROPOFOL 500 MG/50ML IV EMUL
INTRAVENOUS | Status: DC | PRN
  Administered 2016-05-02: 300 ug/kg/min via INTRAVENOUS

## 2016-05-02 MED ORDER — SCOPOLAMINE 1 MG/3DAYS TD PT72
MEDICATED_PATCH | TRANSDERMAL | Status: DC | PRN
  Administered 2016-05-02: 1 via TRANSDERMAL

## 2016-05-02 MED ORDER — ONDANSETRON HCL 4 MG/2ML IJ SOLN
INTRAMUSCULAR | Status: AC
  Filled 2016-05-02: qty 2

## 2016-05-02 SURGICAL SUPPLY — 25 items

## 2016-05-02 NOTE — Anesthesia Preprocedure Evaluation (Signed)
Anesthesia Evaluation  Patient identified by MRN, date of birth, ID band Patient awake    Reviewed: Allergy & Precautions, NPO status , Patient's Chart, lab work & pertinent test results  History of Anesthesia Complications (+) PONV and history of anesthetic complications  Airway Mallampati: I  TM Distance: >3 FB Neck ROM: Full    Dental  (+) Teeth Intact, Dental Advisory Given, Missing   Pulmonary neg pulmonary ROS,    Pulmonary exam normal breath sounds clear to auscultation       Cardiovascular negative cardio ROS Normal cardiovascular exam Rhythm:Regular Rate:Normal     Neuro/Psych negative neurological ROS  negative psych ROS   GI/Hepatic Neg liver ROS, hiatal hernia, GERD  Medicated and Controlled,dysphagia   Endo/Other  Obesity   Renal/GU negative Renal ROS     Musculoskeletal negative musculoskeletal ROS (+)   Abdominal   Peds  Hematology negative hematology ROS (+)   Anesthesia Other Findings Day of surgery medications reviewed with the patient.  Reproductive/Obstetrics                             Anesthesia Physical Anesthesia Plan  ASA: II  Anesthesia Plan: MAC   Post-op Pain Management:    Induction: Intravenous  Airway Management Planned: Nasal Cannula  Additional Equipment:   Intra-op Plan:   Post-operative Plan:   Informed Consent: I have reviewed the patients History and Physical, chart, labs and discussed the procedure including the risks, benefits and alternatives for the proposed anesthesia with the patient or authorized representative who has indicated his/her understanding and acceptance.   Dental advisory given  Plan Discussed with: CRNA and Anesthesiologist  Anesthesia Plan Comments: (Discussed risks/benefits/alternatives to MAC sedation including need for ventilatory support, hypotension, need for conversion to general anesthesia.  All patient  questions answered.  Patient/guardian wishes to proceed.)        Anesthesia Quick Evaluation

## 2016-05-02 NOTE — Transfer of Care (Signed)
Immediate Anesthesia Transfer of Care Note  Patient: BELLAROSE SIGNORE  Procedure(s) Performed: Procedure(s): COLONOSCOPY WITH PROPOFOL (N/A) ESOPHAGOGASTRODUODENOSCOPY (EGD) WITH PROPOFOL (N/A)  Patient Location: PACU  Anesthesia Type:MAC  Level of Consciousness: awake, alert , oriented and patient cooperative  Airway & Oxygen Therapy: Patient Spontanous Breathing and Patient connected to nasal cannula oxygen  Post-op Assessment: Report given to RN, Post -op Vital signs reviewed and stable and Patient moving all extremities X 4  Post vital signs: stable  Last Vitals:  Filed Vitals:   05/02/16 1258  BP: 150/86  Pulse: 78  Temp: 36.8 C  Resp: 20    Last Pain: There were no vitals filed for this visit.       Complications: No apparent anesthesia complications

## 2016-05-02 NOTE — Discharge Instructions (Signed)
Esophagogastroduodenoscopy, Care After Refer to this sheet in the next few weeks. These instructions provide you with information about caring for yourself after your procedure. Your health care provider may also give you more specific instructions. Your treatment has been planned according to current medical practices, but problems sometimes occur. Call your health care provider if you have any problems or questions after your procedure. WHAT TO EXPECT AFTER THE PROCEDURE After your procedure, it is typical to feel:  Soreness in your throat.  Pain with swallowing.  Sick to your stomach (nauseous).  Bloated.  Dizzy.  Fatigued. HOME CARE INSTRUCTIONS  Do not eat or drink anything until the numbing medicine (local anesthetic) has worn off and your gag reflex has returned. You will know that the local anesthetic has worn off when you can swallow comfortably.  Do not drive or operate machinery until directed by your health care provider.  Take medicines only as directed by your health care provider. SEEK MEDICAL CARE IF:   You cannot stop coughing.  You are not urinating at all or less than usual. SEEK IMMEDIATE MEDICAL CARE IF:  You have difficulty swallowing.  You cannot eat or drink.  You have worsening throat or chest pain.  You have dizziness or lightheadedness or you faint.  You have nausea or vomiting.  You have chills.  You have a fever.  You have severe abdominal pain.  You have black, tarry, or bloody stools.   This information is not intended to replace advice given to you by your health care provider. Make sure you discuss any questions you have with your health care provider.   Document Released: 09/26/2012 Document Revised: 10/31/2014 Document Reviewed: 09/26/2012 Elsevier Interactive Patient Education 2016 Elsevier Inc.   Esophageal Dilatation Esophageal dilatation is a procedure to open a blocked or narrowed part of the esophagus. The esophagus is  the long tube in your throat that carries food and liquid from your mouth to your stomach. The procedure is also called esophageal dilation.  You may need this procedure if you have a buildup of scar tissue in your esophagus that makes it difficult, painful, or even impossible to swallow. This can be caused by gastroesophageal reflux disease (GERD). In rare cases, people need this procedure because they have cancer of the esophagus or a problem with the way food moves through the esophagus. Sometimes you may need to have another dilatation to enlarge the opening of the esophagus gradually. LET [facility] CARE PROVIDER KNOW ABOUT:   Any allergies you have.  All medicines you are taking, including vitamins, herbs, eye drops, creams, and over-the-counter medicines.  Previous problems you or members of your family have had with the use of anesthetics.  Any blood disorders you have.  Previous surgeries you have had.  Medical conditions you have.  Any antibiotic medicines you are required to take before dental procedures. RISKS AND COMPLICATIONS Generally, this is a safe procedure. However, problems can occur and include:  Bleeding from a tear in the lining of the esophagus.  A hole (perforation) in the esophagus. BEFORE THE PROCEDURE  Do not eat or drink anything after midnight on the night before the procedure or as directed by your health care provider.  Ask your health care provider about changing or stopping your regular medicines. This is especially important if you are taking diabetes medicines or blood thinners.  Plan to have someone take you home after the procedure. PROCEDURE   You will be given a medicine that  makes you relaxed and sleepy (sedative).  A medicine may be sprayed or gargled to numb the back of the throat.  Your health care provider can use various instruments to do an esophageal dilatation. During the procedure, the instrument used will be placed in your mouth  and passed down into your esophagus. Options include:  Simple dilators. This instrument is carefully placed in the esophagus to stretch it.  Guided wire bougies. In this method, a flexible tube (endoscope) is used to insert a wire into the esophagus. The dilator is passed over this wire to enlarge the esophagus. Then the wire is removed.  Balloon dilators. An endoscope with a small balloon at the end is passed down into the esophagus. Inflating the balloon gently stretches the esophagus and opens it up. AFTER THE PROCEDURE  Your blood pressure, heart rate, breathing rate, and blood oxygen level will be monitored often until the medicines you were given have worn off.  Your throat may feel slightly sore and will probably still feel numb. This will improve slowly over time.  You will not be allowed to eat or drink until the throat numbness has resolved.  If this is a same-day procedure, you may be allowed to go home once you have been able to drink, urinate, and sit on the edge of the bed without nausea or dizziness.  If this is a same-day procedure, you should have a friend or family member with you for the next 24 hours after the procedure.   This information is not intended to replace advice given to you by your health care provider. Make sure you discuss any questions you have with your health care provider.   Document Released: 12/01/2005 Document Revised: 10/31/2014 Document Reviewed: 02/19/2014 Elsevier Interactive Patient Education 2016 Elsevier Inc.  Colonoscopy, Care After Refer to this sheet in the next few weeks. These instructions provide you with information on caring for yourself after your procedure. Your health care provider may also give you more specific instructions. Your treatment has been planned according to current medical practices, but problems sometimes occur. Call your health care provider if you have any problems or questions after your procedure. WHAT TO EXPECT  AFTER THE PROCEDURE  After your procedure, it is typical to have the following:  A small amount of blood in your stool.  Moderate amounts of gas and mild abdominal cramping or bloating. HOME CARE INSTRUCTIONS  Do not drive, operate machinery, or sign important documents for 24 hours.  You may shower and resume your regular physical activities, but move at a slower pace for the first 24 hours.  Take frequent rest periods for the first 24 hours.  Walk around or put a warm pack on your abdomen to help reduce abdominal cramping and bloating.  Drink enough fluids to keep your urine clear or pale yellow.  You may resume your normal diet as instructed by your health care provider. Avoid heavy or fried foods that are hard to digest.  Avoid drinking alcohol for 24 hours or as instructed by your health care provider.  Only take over-the-counter or prescription medicines as directed by your health care provider.  If a tissue sample (biopsy) was taken during your procedure:  Do not take aspirin or blood thinners for 7 days, or as instructed by your health care provider.  Do not drink alcohol for 7 days, or as instructed by your health care provider.  Eat soft foods for the first 24 hours. SEEK MEDICAL CARE IF: You  have persistent spotting of blood in your stool 2-3 days after the procedure. SEEK IMMEDIATE MEDICAL CARE IF:  You have more than a small spotting of blood in your stool.  You pass large blood clots in your stool.  Your abdomen is swollen (distended).  You have nausea or vomiting.  You have a fever.  You have increasing abdominal pain that is not relieved with medicine.   This information is not intended to replace advice given to you by your health care provider. Make sure you discuss any questions you have with your health care provider.   Document Released: 05/24/2004 Document Revised: 07/31/2013 Document Reviewed: 06/17/2013 Elsevier Interactive Patient Education  [employer].

## 2016-05-02 NOTE — H&P (Signed)
  Problems: Chronic, intermittent solid food esophageal dysphagia. Normal barium esophagram with barium tablet performed on 03/18/2016. 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  History: The patient is a 45 year old female born October 31, 1970. She has chronic intermittent heartburn associated with episodes of solid food cervical esophageal dysphagia which precipitates upper anterior chest discomfort. Occasionally the obstructing food bolus will pass with swallows of water. Sometimes she experiences nausea and vomiting which relieves the obstructing food bolus. She underwent a normal barium esophagram with barium tablet swallow. In 2007, she underwent transanal excision of a rectal tubulovillous adenomatous polyp located close to the dentate line.  The patient is scheduled to undergo diagnostic esophagogastroduodenoscopy with possible esophageal stricture dilation and screen for eosinophilic esophagitis followed by surveillance colonoscopy.  Medication allergies: Codeine. Ephedrine causes palpitations.  Past medical history: 2007 transanal excision of a rectal tubulovillous adenomatous polyp close to the dentate line.  Exam: The patient is alert and lying comfortably on the endoscopy stretcher. Abdomen is soft and nontender to palpation. Lungs are clear to auscultation. Cardiac exam reveals a regular rhythm.  Plan: Proceed with diagnostic esophagogastroduodenoscopy, possible esophageal stricture dilation, screen for eosinophilic esophagitis, and surveillance colonoscopy.

## 2016-05-02 NOTE — Op Note (Signed)
[facility] Patient Name: Angela Knight Procedure Date: 05/02/2016 MRN: [id number] Attending MD: Garlan Fair , MD Date of Birth: 1971-01-24 CSN: [id number] Age: 45 Admit Type: Outpatient Procedure:                Colonoscopy Indications:              High risk colon cancer surveillance: Personal                            history of adenoma with villous component. 2007                            transanal resection of a rectal tubulovillous                            adenomatous polyp at the dentate line was performed. Providers:                Garlan Fair, MD, Laverta Baltimore, RN, [facility], Technician, Enrigue Catena, CRNA Referring MD:              Medicines:                Propofol per Anesthesia Complications:            No immediate complications. Estimated Blood Loss:     Estimated blood loss: none. Procedure:                Pre-Anesthesia Assessment:                           - Prior to the procedure, a History and Physical                            was performed, and patient medications and                            allergies were reviewed. The patient's tolerance of                            previous anesthesia was also reviewed. The risks                            and benefits of the procedure and the sedation                            options and risks were discussed with the patient.                            All questions were answered, and informed consent                            was obtained. Prior Anticoagulants: The patient has  taken no previous anticoagulant or antiplatelet                            agents. ASA Grade Assessment: II - A patient with                            mild systemic disease. After reviewing the risks                            and benefits, the patient was deemed in                            satisfactory condition to undergo the procedure.                   - Prior to the procedure, a History and Physical                            was performed, and patient medications and                            allergies were reviewed. The patient's tolerance of                            previous anesthesia was also reviewed. The risks                            and benefits of the procedure and the sedation                            options and risks were discussed with the patient.                            All questions were answered, and informed consent                            was obtained. Prior Anticoagulants: The patient has                            taken no previous anticoagulant or antiplatelet                            agents. ASA Grade Assessment: II - A patient with                            mild systemic disease. After reviewing the risks                            and benefits, the patient was deemed in                            satisfactory condition to undergo the procedure.  After obtaining informed consent, the colonoscope                            was passed under direct vision. Throughout the                            procedure, the patient's blood pressure, pulse, and                            oxygen saturations were monitored continuously. The                            [id number]) scope was introduced through                            the anus and advanced to the the cecum, identified                            by appendiceal orifice and ileocecal valve. The                            colonoscopy was performed without difficulty. The                            patient tolerated the procedure well. The quality                            of the bowel preparation was good. The ileocecal                            valve, the appendiceal orifice and the rectum were                            photographed. Scope In: 1:28:55 PM Scope Out: 1:48:02 PM Scope Withdrawal Time:  0 hours 12 minutes 27 seconds  Total Procedure Duration: 0 hours 19 minutes 7 seconds  Findings:      The perianal and digital rectal examinations were normal.      A 15 mm villous appearing polyp was found in the anus. The polyp was       sessile. Biopsies were taken with a cold forceps for histology.      The exam was otherwise without abnormality. Impression:               - One 15 mm polyp at the anus. Biopsied.                           - The examination was otherwise normal. Moderate Sedation:      N/A- Per Anesthesia Care Recommendation:           - Patient has a contact number available for                            emergencies. The signs and symptoms of potential  delayed complications were discussed with the                            patient. Return to normal activities tomorrow.                            Written discharge instructions were provided to the                            patient.                           - Await pathology results.                           - Repeat colonoscopy in 5 years for surveillance.                           - Resume previous diet.                           - Continue present medications. Procedure Code(s):        --- Professional ---                           [phone], Colonoscopy, flexible; with biopsy, single                            or multiple Diagnosis Code(s):        --- Professional ---                           Z86.010, Personal history of colonic polyps                           K62.0, Anal polyp CPT copyright 2016 American Medical Association. All rights reserved. The codes documented in this report are preliminary and upon coder review may  be revised to meet current compliance requirements. Earle Gell, MD Garlan Fair, MD 05/02/2016 2:15:32 PM This report has been signed electronically. Number of Addenda: 0

## 2016-05-02 NOTE — Anesthesia Postprocedure Evaluation (Signed)
Anesthesia Post Note  Patient: Angela Knight  Procedure(s) Performed: Procedure(s) (LRB): COLONOSCOPY WITH PROPOFOL (N/A) ESOPHAGOGASTRODUODENOSCOPY (EGD) WITH PROPOFOL (N/A)  Patient location during evaluation: Endoscopy Anesthesia Type: MAC Level of consciousness: awake and alert Pain management: pain level controlled Vital Signs Assessment: post-procedure vital signs reviewed and stable Respiratory status: spontaneous breathing, nonlabored ventilation, respiratory function stable and patient connected to nasal cannula oxygen Cardiovascular status: stable and blood pressure returned to baseline Anesthetic complications: no    Last Vitals:  Filed Vitals:   05/02/16 1400 05/02/16 1410  BP: 115/60 122/79  Pulse: 75 70  Temp: 36.4 C   Resp: 17 19    Last Pain: There were no vitals filed for this visit.               Catalina Gravel

## 2016-05-02 NOTE — Op Note (Signed)
[facility] Patient Name: Angela Knight Procedure Date: 05/02/2016 MRN: [id number] Attending MD: Garlan Fair , MD Date of Birth: [birth year] CSN: [id number] Age: 45 Admit Type: Outpatient Procedure:                Upper GI endoscopy Indications:              Dysphagia Providers:                Garlan Fair, MD, Laverta Baltimore, RN, [facility], Technician, Enrigue Catena, CRNA Referring MD:              Medicines:                Propofol per Anesthesia Complications:            No immediate complications. Estimated Blood Loss:     Estimated blood loss: none. Procedure:                Pre-Anesthesia Assessment:                           - Prior to the procedure, a History and Physical                            was performed, and patient medications and                            allergies were reviewed. The patient's tolerance of                            previous anesthesia was also reviewed. The risks                            and benefits of the procedure and the sedation                            options and risks were discussed with the patient.                            All questions were answered, and informed consent                            was obtained. Prior Anticoagulants: The patient has                            taken no previous anticoagulant or antiplatelet                            agents. ASA Grade Assessment: II - A patient with                            mild systemic disease. After reviewing the risks  and benefits, the patient was deemed in                            satisfactory condition to undergo the procedure.                           After obtaining informed consent, the endoscope was                            passed under direct vision. Throughout the                            procedure, the patient's blood pressure, pulse, and                            oxygen  saturations were monitored continuously. The                            [id number] scope was introduced through the                            mouth, and advanced to the second part of duodenum.                            The upper GI endoscopy was accomplished without                            difficulty. The patient tolerated the procedure                            well. Findings:      The Z-line was regular and was found 40 cm from the incisors.      One mild benign-appearing, intrinsic stenosis was found 40 cm from the       incisors. This measured less than one cm (in length) and was traversed.       A TTS dilator was passed through the scope. Dilation with a 15-16.5-18       mm balloon dilator was performed to 18 mm. The dilation site was       examined and showed complete resolution of luminal narrowing.      A few 3 mm sessile polyps with no bleeding and no stigmata of recent       bleeding were found on the greater curvature of the stomach. A few of       the polyps was removed with a cold biopsy forceps. Resection and       retrieval were complete.      The examined duodenum was normal. Impression:               - Z-line regular, 40 cm from the incisors.                           - Benign-appearing esophageal stenosis. Dilated.                           - A few gastric polyps.  Resected and retrieved.                           - Normal examined duodenum. Moderate Sedation:      N/A- Per Anesthesia Care Recommendation:           - Patient has a contact number available for                            emergencies. The signs and symptoms of potential                            delayed complications were discussed with the                            patient. Return to normal activities tomorrow.                            Written discharge instructions were provided to the                            patient.                           - Await pathology results.                            - Resume previous diet.                           - Continue present medications. Procedure Code(s):        --- Professional ---                           [id number], Esophagogastroduodenoscopy, flexible,                            transoral; with transendoscopic balloon dilation of                            esophagus (less than 30 mm diameter)                           [id number], Esophagogastroduodenoscopy, flexible,                            transoral; with biopsy, single or multiple Diagnosis Code(s):        --- Professional ---                           K22.2, Esophageal obstruction                           K31.7, Polyp of stomach and duodenum                           R13.10, Dysphagia, unspecified CPT copyright 2016 American Medical Association. All rights reserved. The codes documented in  this report are preliminary and upon coder review may  be revised to meet current compliance requirements. Earle Gell, MD Garlan Fair, MD 05/02/2016 2:09:09 PM This report has been signed electronically. Number of Addenda: 0

## 2016-05-03 ENCOUNTER — Encounter (HOSPITAL_COMMUNITY): Payer: Self-pay | Admitting: Gastroenterology

## 2016-06-07 ENCOUNTER — Other Ambulatory Visit: Payer: Self-pay | Admitting: Radiology

## 2016-06-10 ENCOUNTER — Ambulatory Visit: Payer: Self-pay | Admitting: General Surgery

## 2016-06-10 NOTE — H&P (Signed)
Angela Knight. Ballas 06/10/2016 11:33 AM Location: [facility] Patient #: [id number] DOB: 1971/07/19 Married / Language: English / Race: White Female  History of Present Illness Angela Hollingshead MD; 06/10/2016 12:20 PM) The patient is a 45 year old female.   Note:She is referred by Dr. Wynetta Emery for consultation regarding a tubulovillous adenoma of the rectum. She had transanal excision of tubulovillous adenoma of the rectum 10 years ago at age 58. She underwent her surveillance colonoscopy in another area was found in the distal rectum. It was biopsied and consistent with tubulovillous adenoma. She is here now to discuss further treatment of that. No rectal bleeding. No recent change in bowel habits. Her husband is with her. Of note is that she is allergic to narcotics and tramadol all of which cause significant nausea and vomiting.  Other Problems Elbert Ewings, CMA; 06/10/2016 11:34 AM) Bladder Problems Gastroesophageal Reflux Disease General anesthesia - complications  Past Surgical History Elbert Ewings, CMA; 06/10/2016 11:34 AM) Breast Biopsy Left. Colon Polyp Removal - Open  Diagnostic Studies History Elbert Ewings, CMA; 06/10/2016 11:34 AM) Colonoscopy within last year Mammogram 1-3 years ago  Allergies Elbert Ewings, CMA; 06/10/2016 11:35 AM) Codeine Sulfate *ANALGESICS - OPIOID* Pseudoephedrine HCl *NASAL AGENTS - SYSTEMIC AND TOPICAL* ePHEDrine HCl *ANTIASTHMATIC AND BRONCHODILATOR AGENTS*  Medication History Elbert Ewings, CMA; 06/10/2016 11:35 AM) Pepcid AC (10MG  Tablet, Oral) Active. Medications Reconciled  Social History Elbert Ewings, [place]; 06/10/2016 11:34 AM) No alcohol use No caffeine use No drug use Tobacco use Never smoker.  Family History Elbert Ewings, [place]; 06/10/2016 11:34 AM) Arthritis Father, Mother. Cancer Father, Mother. Depression Mother. Diabetes Mellitus Mother. Heart Disease Mother. Hypertension Mother. Migraine  Headache Son. Prostate Cancer Father.  Pregnancy / Birth History Elbert Ewings, CMA; 06/10/2016 11:34 AM) Age at menarche 6 years. Gravida 9 Length (months) of breastfeeding 7-12 Maternal age 34-25 Para 6 Regular periods     Review of Systems Elbert Ewings CMA; 06/10/2016 11:34 AM) General Not Present- Appetite Loss, Chills, Fatigue, Fever, Night Sweats, Weight Gain and Weight Loss. Skin Not Present- Change in Wart/Mole, Dryness, Hives, Jaundice, New Lesions, Non-Healing Wounds, Rash and Ulcer. HEENT Present- Wears glasses/contact lenses. Not Present- Earache, Hearing Loss, Hoarseness, Nose Bleed, Oral Ulcers, Ringing in the Ears, Seasonal Allergies, Sinus Pain, Sore Throat, Visual Disturbances and Yellow Eyes. Respiratory Not Present- Bloody sputum, Chronic Cough, Difficulty Breathing, Snoring and Wheezing. Cardiovascular Not Present- Chest Pain, Difficulty Breathing Lying Down, Leg Cramps, Palpitations, Rapid Heart Rate, Shortness of Breath and Swelling of Extremities. Gastrointestinal Present- Difficulty Swallowing and Indigestion. Not Present- Abdominal Pain, Bloating, Bloody Stool, Change in Bowel Habits, Chronic diarrhea, Constipation, Excessive gas, Gets full quickly at meals, Hemorrhoids, Nausea, Rectal Pain and Vomiting. Female Genitourinary Present- Frequency and Urgency. Not Present- Nocturia, Painful Urination and Pelvic Pain. Neurological Present- Headaches. Not Present- Decreased Memory, Fainting, Numbness, Seizures, Tingling, Tremor, Trouble walking and Weakness. Psychiatric Not Present- Anxiety, Bipolar, Change in Sleep Pattern, Depression, Fearful and Frequent crying. Endocrine Not Present- Cold Intolerance, Excessive Hunger, Hair Changes, Heat Intolerance, Hot flashes and New Diabetes. Hematology Not Present- Blood Thinners, Easy Bruising, Excessive bleeding, Gland problems, HIV and Persistent Infections.  Vitals Elbert Ewings CMA; 06/10/2016 11:36 AM) 06/10/2016 11:35  AM Weight: 203 lb Height: 65.5in Body Surface Area: 2 m Body Mass Index: 33.27 kg/m  Temp.: 98.9F(Temporal)  Pulse: 90 (Regular)  BP: 124/80 (Sitting, Left Arm, Standard)      Physical Exam Angela Hollingshead MD; 06/10/2016 12:24 PM)  The physical exam findings are  as follows: Note:General: Obese female in NAD. Pleasant and cooperative.  HEENT: [place]/AT, no external nasal or ear masses, mucous membranes are moist  CV: RRR, no murmur, no edema  CHEST: Breath sounds equal and clear. Respirations nonlabored.  ABDOMEN: Soft, obese, nontender, nondistended, no masses  ANORECTAL: No fissures. Normal sphincter tone. Palpable irregular mass that appears to be in the left lateral side.  Anoscopy was performed and I was able to visualize some of the mass but there was a fair amount of stool in the rectal vault that obscured my vision.  SKIN: No jaundice or suspicious .  NEUROLOGIC: Alert and oriented, answers questions appropriately.  PSYCHIATRIC: Normal mood, affect , and behavior.    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 No high-grade dysplasia identified.  Plan: I recommended transanal excision. We went over the procedure and the risks. Risks including but not limited to bleeding, infection, recurrence, healing problems, anesthesia. She seems to understand all this and agrees with the plan.  Jackolyn Confer, MD

## 2016-06-30 ENCOUNTER — Encounter (HOSPITAL_BASED_OUTPATIENT_CLINIC_OR_DEPARTMENT_OTHER): Payer: Self-pay | Admitting: *Deleted

## 2016-06-30 NOTE — Progress Notes (Signed)
NPO AFTER MN.  ARRIVE AT 1030.  NEEDS URINE PREG.  GETTING LAB WORK DONE Friday 07-01-2016 (CBCdiff, CMET).  WILL DO HIBICLENS HS BEFORE AND AM DOS.

## 2016-07-01 DIAGNOSIS — D128 Benign neoplasm of rectum: Secondary | ICD-10-CM | POA: Diagnosis present

## 2016-07-01 DIAGNOSIS — K219 Gastro-esophageal reflux disease without esophagitis: Secondary | ICD-10-CM | POA: Diagnosis not present

## 2016-07-01 DIAGNOSIS — E669 Obesity, unspecified: Secondary | ICD-10-CM | POA: Diagnosis not present

## 2016-07-01 DIAGNOSIS — Z6832 Body mass index (BMI) 32.0-32.9, adult: Secondary | ICD-10-CM | POA: Diagnosis not present

## 2016-07-01 LAB — CBC WITH DIFFERENTIAL/PLATELET
Basophils Absolute: 0 10*3/uL (ref 0.0–0.1)
Basophils Relative: 0 %
EOS ABS: 0.2 10*3/uL (ref 0.0–0.7)
Eosinophils Relative: 3 %
HEMATOCRIT: 38.8 % (ref 36.0–46.0)
HEMOGLOBIN: 13.6 g/dL (ref 12.0–15.0)
LYMPHS ABS: 3.7 10*3/uL (ref 0.7–4.0)
Lymphocytes Relative: 46 %
MCH: 29.7 pg (ref 26.0–34.0)
MCHC: 35.1 g/dL (ref 30.0–36.0)
MCV: 84.7 fL (ref 78.0–100.0)
MONO ABS: 0.8 10*3/uL (ref 0.1–1.0)
MONOS PCT: 10 %
NEUTROS PCT: 41 %
Neutro Abs: 3.3 10*3/uL (ref 1.7–7.7)
Platelets: 284 10*3/uL (ref 150–400)
RBC: 4.58 MIL/uL (ref 3.87–5.11)
RDW: 13.3 % (ref 11.5–15.5)
WBC: 8 10*3/uL (ref 4.0–10.5)

## 2016-07-01 LAB — COMPREHENSIVE METABOLIC PANEL
ALK PHOS: 40 U/L (ref 38–126)
ALT: 30 U/L (ref 14–54)
ANION GAP: 6 (ref 5–15)
AST: 26 U/L (ref 15–41)
Albumin: 4.5 g/dL (ref 3.5–5.0)
BILIRUBIN TOTAL: 0.6 mg/dL (ref 0.3–1.2)
BUN: 10 mg/dL (ref 6–20)
CALCIUM: 10.4 mg/dL — AB (ref 8.9–10.3)
CO2: 27 mmol/L (ref 22–32)
Chloride: 104 mmol/L (ref 101–111)
Creatinine, Ser: 0.72 mg/dL (ref 0.44–1.00)
GFR calc non Af Amer: >60 mL/min (ref >60)
Glucose, Bld: 94 mg/dL (ref 65–99)
POTASSIUM: 3.9 mmol/L (ref 3.5–5.1)
SODIUM: 137 mmol/L (ref 135–145)
TOTAL PROTEIN: 7.5 g/dL (ref 6.5–8.1)

## 2016-07-05 ENCOUNTER — Ambulatory Visit (HOSPITAL_BASED_OUTPATIENT_CLINIC_OR_DEPARTMENT_OTHER): Payer: 59 | Admitting: Anesthesiology

## 2016-07-05 ENCOUNTER — Ambulatory Visit (HOSPITAL_BASED_OUTPATIENT_CLINIC_OR_DEPARTMENT_OTHER)
Admission: RE | Admit: 2016-07-05 | Discharge: 2016-07-05 | Disposition: A | Discharge status: Home / Self Care | Payer: 59 | Source: Ambulatory Visit | Attending: General Surgery | Admitting: General Surgery

## 2016-07-05 ENCOUNTER — Encounter (HOSPITAL_BASED_OUTPATIENT_CLINIC_OR_DEPARTMENT_OTHER)
Admission: RE | Disposition: A | Discharge status: Home / Self Care | Payer: Self-pay | Source: Ambulatory Visit | Attending: General Surgery

## 2016-07-05 ENCOUNTER — Encounter (HOSPITAL_BASED_OUTPATIENT_CLINIC_OR_DEPARTMENT_OTHER): Payer: Self-pay

## 2016-07-05 DIAGNOSIS — D128 Benign neoplasm of rectum: Secondary | ICD-10-CM | POA: Diagnosis not present

## 2016-07-05 DIAGNOSIS — Z6832 Body mass index (BMI) 32.0-32.9, adult: Secondary | ICD-10-CM | POA: Insufficient documentation

## 2016-07-05 DIAGNOSIS — K219 Gastro-esophageal reflux disease without esophagitis: Secondary | ICD-10-CM | POA: Insufficient documentation

## 2016-07-05 DIAGNOSIS — E669 Obesity, unspecified: Secondary | ICD-10-CM | POA: Insufficient documentation

## 2016-07-05 HISTORY — DX: Presence of spectacles and contact lenses: Z97.3

## 2016-07-05 HISTORY — DX: Family history of other specified conditions: Z84.89

## 2016-07-05 HISTORY — DX: Personal history of other diseases of the digestive system: Z87.19

## 2016-07-05 HISTORY — DX: Adverse effect of unspecified anesthetic, initial encounter: T41.45XA

## 2016-07-05 HISTORY — DX: Other complications of anesthesia, initial encounter: T88.59XA

## 2016-07-05 HISTORY — DX: Benign neoplasm of rectum: D12.8

## 2016-07-05 HISTORY — PX: TUMOR EXCISION: SHX421

## 2016-07-05 LAB — POCT PREGNANCY, URINE: Preg Test, Ur: NEGATIVE

## 2016-07-05 SURGERY — TRANSRECTAL TUMOR EXCISION
Anesthesia: General | Site: Rectum

## 2016-07-05 MED ORDER — LIDOCAINE 2% (20 MG/ML) 5 ML SYRINGE
INTRAMUSCULAR | Status: DC | PRN
  Administered 2016-07-05: 100 mg via INTRAVENOUS

## 2016-07-05 MED ORDER — ACETAMINOPHEN 325 MG PO TABS
650.0000 mg | ORAL_TABLET | ORAL | Status: DC | PRN
  Filled 2016-07-05: qty 2

## 2016-07-05 MED ORDER — MIDAZOLAM HCL 5 MG/5ML IJ SOLN
INTRAMUSCULAR | Status: DC | PRN
  Administered 2016-07-05: 2 mg via INTRAVENOUS

## 2016-07-05 MED ORDER — FENTANYL CITRATE (PF) 100 MCG/2ML IJ SOLN
INTRAMUSCULAR | Status: AC
  Filled 2016-07-05: qty 2

## 2016-07-05 MED ORDER — FENTANYL CITRATE (PF) 100 MCG/2ML IJ SOLN
25.0000 ug | INTRAMUSCULAR | Status: DC | PRN
  Filled 2016-07-05: qty 1

## 2016-07-05 MED ORDER — KETOROLAC TROMETHAMINE 30 MG/ML IJ SOLN
INTRAMUSCULAR | Status: AC
  Filled 2016-07-05: qty 1

## 2016-07-05 MED ORDER — PROPOFOL 10 MG/ML IV BOLUS
INTRAVENOUS | Status: AC
  Filled 2016-07-05: qty 20

## 2016-07-05 MED ORDER — DEXAMETHASONE SODIUM PHOSPHATE 4 MG/ML IJ SOLN
INTRAMUSCULAR | Status: DC | PRN
  Administered 2016-07-05: 10 mg via INTRAVENOUS

## 2016-07-05 MED ORDER — CHLORHEXIDINE GLUCONATE CLOTH 2 % EX PADS
6.0000 | MEDICATED_PAD | Freq: Once | CUTANEOUS | Status: DC
  Filled 2016-07-05: qty 6

## 2016-07-05 MED ORDER — LIDOCAINE 2% (20 MG/ML) 5 ML SYRINGE
INTRAMUSCULAR | Status: AC
  Filled 2016-07-05: qty 5

## 2016-07-05 MED ORDER — ACETAMINOPHEN 10 MG/ML IV SOLN
INTRAVENOUS | Status: DC | PRN
  Administered 2016-07-05: 1000 mg via INTRAVENOUS

## 2016-07-05 MED ORDER — PROPOFOL 10 MG/ML IV BOLUS
INTRAVENOUS | Status: DC | PRN
Start: 2016-07-05 — End: 2016-07-05
  Administered 2016-07-05: 200 mg via INTRAVENOUS

## 2016-07-05 MED ORDER — SCOPOLAMINE 1 MG/3DAYS TD PT72
MEDICATED_PATCH | TRANSDERMAL | Status: AC
  Filled 2016-07-05: qty 1

## 2016-07-05 MED ORDER — ONDANSETRON HCL 4 MG/2ML IJ SOLN
INTRAMUSCULAR | Status: AC
  Filled 2016-07-05: qty 2

## 2016-07-05 MED ORDER — PROPOFOL 500 MG/50ML IV EMUL
INTRAVENOUS | Status: DC | PRN
  Administered 2016-07-05: 200 ug/kg/min via INTRAVENOUS

## 2016-07-05 MED ORDER — SCOPOLAMINE 1 MG/3DAYS TD PT72
1.0000 | MEDICATED_PATCH | TRANSDERMAL | Status: DC
  Administered 2016-07-05: 1.5 mg via TRANSDERMAL
  Administered 2016-07-05: 1 via TRANSDERMAL
  Filled 2016-07-05: qty 1

## 2016-07-05 MED ORDER — ACETAMINOPHEN 650 MG RE SUPP
650.0000 mg | RECTAL | Status: DC | PRN
  Filled 2016-07-05: qty 1

## 2016-07-05 MED ORDER — DEXAMETHASONE SODIUM PHOSPHATE 10 MG/ML IJ SOLN
INTRAMUSCULAR | Status: AC
  Filled 2016-07-05: qty 1

## 2016-07-05 MED ORDER — CEFOTETAN DISODIUM-DEXTROSE 2-2.08 GM-% IV SOLR
INTRAVENOUS | Status: AC
  Filled 2016-07-05: qty 50

## 2016-07-05 MED ORDER — BUPIVACAINE-EPINEPHRINE 0.5% -1:200000 IJ SOLN
INTRAMUSCULAR | Status: DC | PRN
  Administered 2016-07-05: 8 mL

## 2016-07-05 MED ORDER — MIDAZOLAM HCL 2 MG/2ML IJ SOLN
INTRAMUSCULAR | Status: AC
  Filled 2016-07-05: qty 2

## 2016-07-05 MED ORDER — FENTANYL CITRATE (PF) 100 MCG/2ML IJ SOLN
INTRAMUSCULAR | Status: DC | PRN
Start: 2016-07-05 — End: 2016-07-05
  Administered 2016-07-05: 50 ug via INTRAVENOUS

## 2016-07-05 MED ORDER — DEXTROSE 5 % IV SOLN
2.0000 g | INTRAVENOUS | Status: AC
  Administered 2016-07-05: 2 g via INTRAVENOUS
  Filled 2016-07-05: qty 2

## 2016-07-05 MED ORDER — LACTATED RINGERS IV SOLN
INTRAVENOUS | Status: DC
  Administered 2016-07-05: 11:00:00 via INTRAVENOUS
  Filled 2016-07-05: qty 1000

## 2016-07-05 MED ORDER — SODIUM CHLORIDE 0.9 % IR SOLN
Status: DC | PRN
  Administered 2016-07-05: 1000 mL

## 2016-07-05 MED ORDER — SODIUM CHLORIDE 0.9% FLUSH
3.0000 mL | INTRAVENOUS | Status: DC | PRN
  Filled 2016-07-05: qty 3

## 2016-07-05 MED ORDER — PROMETHAZINE HCL 25 MG/ML IJ SOLN
6.2500 mg | INTRAMUSCULAR | Status: DC | PRN
Start: 2016-07-05 — End: 2016-07-05
  Filled 2016-07-05: qty 1

## 2016-07-05 MED ORDER — BUPIVACAINE LIPOSOME 1.3 % IJ SUSP
INTRAMUSCULAR | Status: DC | PRN
  Administered 2016-07-05: 20 mL

## 2016-07-05 MED ORDER — ONDANSETRON HCL 4 MG/2ML IJ SOLN
INTRAMUSCULAR | Status: DC | PRN
  Administered 2016-07-05 (×2): 4 mg via INTRAVENOUS

## 2016-07-05 SURGICAL SUPPLY — 50 items
BLADE HEX COATED 2.75 (ELECTRODE) ×2 IMPLANT
BLADE SURG 15 STRL LF DISP TIS (BLADE) ×1 IMPLANT
BLADE SURG 15 STRL SS (BLADE) ×2
BRIEF STRETCH FOR OB PAD LRG (UNDERPADS AND DIAPERS) ×2 IMPLANT
CANISTER SUCTION 1200CC (MISCELLANEOUS) IMPLANT
CANISTER SUCTION 2500CC (MISCELLANEOUS) IMPLANT
CLOTH BEACON ORANGE TIMEOUT ST (SAFETY) ×2 IMPLANT
COVER BACK TABLE 60X90IN (DRAPES) ×2 IMPLANT
DRAPE LG THREE QUARTER DISP (DRAPES) ×2 IMPLANT
DRAPE UNDERBUTTOCKS STRL (DRAPE) ×2 IMPLANT
DRSG PAD ABDOMINAL 8X10 ST (GAUZE/BANDAGES/DRESSINGS) ×1 IMPLANT
ELECT BLADE 6.5 .24CM SHAFT (ELECTRODE) IMPLANT
ELECT REM PT RETURN 9FT ADLT (ELECTROSURGICAL) ×2
ELECTRODE REM PT RTRN 9FT ADLT (ELECTROSURGICAL) ×1 IMPLANT
GELFOAM 100 342 01 (MISCELLANEOUS) IMPLANT
GELFOAM SMALL 12 7MM 315 3 (MISCELLANEOUS) IMPLANT
GLOVE BIO SURGEON STRL SZ 6.5 (GLOVE) ×1 IMPLANT
GLOVE BIO SURGEON STRL SZ8 (GLOVE) ×2 IMPLANT
GLOVE INDICATOR 6.5 STRL GRN (GLOVE) ×2 IMPLANT
GOWN STRL REUS W/ TWL XL LVL3 (GOWN DISPOSABLE) IMPLANT
GOWN STRL REUS W/TWL LRG LVL3 (GOWN DISPOSABLE) ×1 IMPLANT
GOWN STRL REUS W/TWL XL LVL3 (GOWN DISPOSABLE) ×2
GOWN W/COTTON TOWEL STD LRG (GOWNS) ×2 IMPLANT
HAND PIECE HARMONIC (INSTRUMENTS) ×2
HANDPIECE HARMONIC (INSTRUMENTS) IMPLANT
IV NS 1000ML (IV SOLUTION) ×2
IV NS 1000ML BAXH (IV SOLUTION) IMPLANT
KIT ROOM TURNOVER WOR (KITS) ×2 IMPLANT
LEGGING LITHOTOMY PAIR STRL (DRAPES) ×2 IMPLANT
NDL HYPO 25X1 1.5 SAFETY (NEEDLE) ×1 IMPLANT
NEEDLE HYPO 22GX1.5 SAFETY (NEEDLE) IMPLANT
NEEDLE HYPO 25X1 1.5 SAFETY (NEEDLE) ×2 IMPLANT
NS IRRIG 500ML POUR BTL (IV SOLUTION) ×1 IMPLANT
PACK BASIN DAY SURGERY FS (CUSTOM PROCEDURE TRAY) ×2 IMPLANT
PAD ABD 8X10 STRL (GAUZE/BANDAGES/DRESSINGS) IMPLANT
PAD PREP 24X48 CUFFED NSTRL (MISCELLANEOUS) ×2 IMPLANT
PENCIL BUTTON HOLSTER BLD 10FT (ELECTRODE) ×2 IMPLANT
SPONGE HEMORRHOID 8X3CM (HEMOSTASIS) ×2 IMPLANT
SURGILUBE 2OZ TUBE FLIPTOP (MISCELLANEOUS) ×2 IMPLANT
SUT CHROMIC 2 0 SH (SUTURE) IMPLANT
SUT CHROMIC 3 0 SH 27 (SUTURE) IMPLANT
SUT VIC AB 3-0 SH 27 (SUTURE)
SUT VIC AB 3-0 SH 27X BRD (SUTURE) IMPLANT
SYR CONTROL 10ML LL (SYRINGE) ×2 IMPLANT
TOWEL OR 17X24 6PK STRL BLUE (TOWEL DISPOSABLE) ×4 IMPLANT
TRAY DSU PREP LF (CUSTOM PROCEDURE TRAY) ×2 IMPLANT
TUBE CONNECTING 12X1/4 (SUCTIONS) ×1 IMPLANT
UNDERPAD 30X30 INCONTINENT (UNDERPADS AND DIAPERS) ×2 IMPLANT
WATER STERILE IRR 500ML POUR (IV SOLUTION) IMPLANT
YANKAUER SUCT BULB TIP NO VENT (SUCTIONS) ×2 IMPLANT

## 2016-07-05 NOTE — Discharge Instructions (Addendum)
[facility] _______[facility]  RECTAL SURGERY POST OP INSTRUCTIONS: POST OP INSTRUCTIONS  Always review your discharge instruction sheet given to you by the facility where your surgery was performed. IF YOU HAVE DISABILITY OR FAMILY LEAVE FORMS, YOU MUST BRING THEM TO THE OFFICE FOR PROCESSING.   DO NOT GIVE THEM TO YOUR DOCTOR.  1. A  prescription for pain medication may be given to you upon discharge.  Take your pain medication as prescribed, if needed.  If narcotic pain medicine is not needed, then you may take acetaminophen (Tylenol) or ibuprofen (Advil) as needed. 2. Take your usually prescribed medications unless otherwise directed. 3. If you need a refill on your pain medication, please contact your pharmacy.  They will contact our office to request authorization. Prescriptions will not be filled after 5 pm or on week-ends. 4. You should follow a full liquid diet the first 72 hours after arrival home. After that, low fiber diet for 7 days. After those 7 days, start a high fiber diet. 5. Most patients will experience some swelling and discomfort in the rectal area. Ice packs, reclining and warm tub soaks will help.  Swelling and discomfort can take several days to resolve.  6. It is common to experience some constipation if taking pain medication after surgery.  Increasing fluid intake and taking a stool softener (such as Colace) will usually help or prevent this problem from occurring.  A mild laxative (Milk of Magnesia or Miralax) should be taken according to package directions if there are no bowel movements after 48 hours. 7. Unless discharge instructions indicate otherwise, leave your bandage dry and in place for 24 hours, or remove the bandage if you have a bowel movement. You may notice a small amount of bleeding with bowel movements for the first few days. You may have some packing in the rectum which will come out over the first day or two. You will need to wear an absorbent  pad or soft cotton gauze in your underwear until the drainage stops.it. 8. ACTIVITIES:  You may resume regular (light) daily activities beginning the next day--such as daily self-care, walking, climbing stairs--gradually increasing activities as tolerated.  You may have sexual intercourse when it is comfortable.  Refrain from any heavy lifting or straining for one week.  a. You may drive when you are no longer taking prescription pain medication, you can comfortably wear a seatbelt, and you can safely maneuver your car and apply brakes. b. RETURN TO WORK: : ____________________ c.  9. You should see your doctor in the office for a follow-up appointment approximately 2-3 weeks after your surgery.  Make sure that you call for this appointment within a day or two after you arrive home to insure a convenient appointment time. 10. OTHER INSTRUCTIONS:  __Take a stool softener twice a day.________________________________________________________________________________________________________________________________________________________________________________________  WHEN TO CALL YOUR DOCTOR: 1. Fever over 101.0 2. Inability to urinate 3. Nausea and/or vomiting 4. Extreme swelling or bruising 5. Continued bleeding from rectum. 6. Increased pain, redness, or drainage from the incision 7. Constipation  The clinic staff is available to answer your questions during regular business hours.  Please dont hesitate to call and ask to speak to one of the nurses for clinical concerns.  If you have a medical emergency, go to the nearest emergency room or call 911.  A surgeon from [facility] is always on call at the hospital   [address], [place], [place], [place]  [address] ?  [id number], [place], [place]   [address] [phone] ? [phone] ? FAX [phone] Web site: [email or web address]  Post Anesthesia Home Care Instructions  Activity: Get plenty of rest for  the remainder of the day. A responsible adult should stay with you for 24 hours following the procedure.  For the next 24 hours, DO NOT: -Drive a car -Paediatric nurse -Drink alcoholic beverages -Take any medication unless instructed by your physician -Make any legal decisions or sign important papers.  Meals: Start with liquid foods such as gelatin or soup. Progress to regular foods as tolerated. Avoid greasy, spicy, heavy foods. If nausea and/or vomiting occur, drink only clear liquids until the nausea and/or vomiting subsides. Call your physician if vomiting continues.  Special Instructions/Symptoms: Your throat may feel dry or sore from the anesthesia or the breathing tube placed in your throat during surgery. If this causes discomfort, gargle with warm salt water. The discomfort should disappear within 24 hours.  If you had a scopolamine patch placed behind your ear for the management of post- operative nausea and/or vomiting:  1. The medication in the patch is effective for 72 hours, after which it should be removed.  Wrap patch in a tissue and discard in the trash. Wash hands thoroughly with soap and water. 2. You may remove the patch earlier than 72 hours if you experience unpleasant side effects which may include dry mouth, dizziness or visual disturbances. 3. Avoid touching the patch. Wash your hands with soap and water after contact with the patch.   Information for Discharge Teaching: EXPAREL (bupivacaine liposome injectable suspension)   Your surgeon gave you EXPAREL(bupivacaine) in your surgical incision to help control your pain after surgery.   EXPAREL is a local anesthetic that provides pain relief by numbing the tissue around the surgical site.  EXPAREL is designed to release pain medication over time and can control pain for up to 72 hours.  Depending on how you respond to EXPAREL, you may require less pain medication during your recovery.  Possible side  effects:  Temporary loss of sensation or ability to move in the area where bupivacaine was injected.  Nausea, vomiting, constipation  Rarely, numbness and tingling in your mouth or lips, lightheadedness, or anxiety may occur.  Call your doctor right away if you think you may be experiencing any of these sensations, or if you have other questions regarding possible side effects.  Follow all other discharge instructions given to you by your surgeon or nurse. Eat a healthy diet and drink plenty of water or other fluids.  If you return to the hospital for any reason within 96 hours following the administration of EXPAREL, please inform your health care providers.

## 2016-07-05 NOTE — Anesthesia Postprocedure Evaluation (Signed)
Anesthesia Post Note  Patient: EUDA CREQUE  Procedure(s) Performed: Procedure(s) (LRB): TRANSANAL EXCISION OF TUBULLOVILLOUS ADENOMA OF RECTUM (N/A)  Patient location during evaluation: PACU Anesthesia Type: General Level of consciousness: awake and alert Pain management: pain level controlled Vital Signs Assessment: post-procedure vital signs reviewed and stable Respiratory status: spontaneous breathing, nonlabored ventilation, respiratory function stable and patient connected to nasal cannula oxygen Cardiovascular status: blood pressure returned to baseline and stable Postop Assessment: no signs of nausea or vomiting Anesthetic complications: no    Last Vitals:  Vitals:   07/05/16 1315 07/05/16 1330  BP: 107/62 102/60  Pulse: (!) 57 (!) 55  Resp: 15 14  Temp:      Last Pain:  Vitals:   07/05/16 1028  TempSrc: Oral                 Zenaida Deed

## 2016-07-05 NOTE — H&P (View-Only) (Signed)
Angela Knight. Angela Knight 06/10/2016 11:33 AM Location: [facility] Patient #: [id number] DOB: 21-Mar-1971 Married / Language: English / Race: White Female  History of Present Illness Angela Knight; 06/10/2016 12:20 PM) The patient is a 45 year old female.   Note:She is referred by Dr. Wynetta Emery for consultation regarding a tubulovillous adenoma of the rectum. She had transanal excision of tubulovillous adenoma of the rectum 10 years ago at age 27. She underwent her surveillance colonoscopy in another area was found in the distal rectum. It was biopsied and consistent with tubulovillous adenoma. She is here now to discuss further treatment of that. No rectal bleeding. No recent change in bowel habits. Her husband is with her. Of note is that she is allergic to narcotics and tramadol all of which cause significant nausea and vomiting.  Other Problems Angela Knight, CMA; 06/10/2016 11:34 AM) Bladder Problems Gastroesophageal Reflux Disease General anesthesia - complications  Past Surgical History Angela Knight, CMA; 06/10/2016 11:34 AM) Breast Biopsy Left. Colon Polyp Removal - Open  Diagnostic Studies History Angela Knight, CMA; 06/10/2016 11:34 AM) Colonoscopy within last year Mammogram 1-3 years ago  Allergies Angela Knight, CMA; 06/10/2016 11:35 AM) Codeine Sulfate *ANALGESICS - OPIOID* Pseudoephedrine HCl *NASAL AGENTS - SYSTEMIC AND TOPICAL* ePHEDrine HCl *ANTIASTHMATIC AND BRONCHODILATOR AGENTS*  Medication History Angela Knight, CMA; 06/10/2016 11:35 AM) Pepcid AC (10MG  Tablet, Oral) Active. Medications Reconciled  Social History Angela Knight, [place]; 06/10/2016 11:34 AM) No alcohol use No caffeine use No drug use Tobacco use Never smoker.  Family History Angela Knight, [place]; 06/10/2016 11:34 AM) Arthritis Father, Mother. Cancer Father, Mother. Depression Mother. Diabetes Mellitus Mother. Heart Disease Mother. Hypertension Mother. Migraine  Headache Son. Prostate Cancer Father.  Pregnancy / Birth History Angela Knight, CMA; 06/10/2016 11:34 AM) Age at menarche 47 years. Gravida 9 Length (months) of breastfeeding 7-12 Maternal age 70-25 Para 6 Regular periods     Review of Systems Angela Knight CMA; 06/10/2016 11:34 AM) General Not Present- Appetite Loss, Chills, Fatigue, Fever, Night Sweats, Weight Gain and Weight Loss. Skin Not Present- Change in Wart/Mole, Dryness, Hives, Jaundice, New Lesions, Non-Healing Wounds, Rash and Ulcer. HEENT Present- Wears glasses/contact lenses. Not Present- Earache, Hearing Loss, Hoarseness, Nose Bleed, Oral Ulcers, Ringing in the Ears, Seasonal Allergies, Sinus Pain, Sore Throat, Visual Disturbances and Yellow Eyes. Respiratory Not Present- Bloody sputum, Chronic Cough, Difficulty Breathing, Snoring and Wheezing. Cardiovascular Not Present- Chest Pain, Difficulty Breathing Lying Down, Leg Cramps, Palpitations, Rapid Heart Rate, Shortness of Breath and Swelling of Extremities. Gastrointestinal Present- Difficulty Swallowing and Indigestion. Not Present- Abdominal Pain, Bloating, Bloody Stool, Change in Bowel Habits, Chronic diarrhea, Constipation, Excessive gas, Gets full quickly at meals, Hemorrhoids, Nausea, Rectal Pain and Vomiting. Female Genitourinary Present- Frequency and Urgency. Not Present- Nocturia, Painful Urination and Pelvic Pain. Neurological Present- Headaches. Not Present- Decreased Memory, Fainting, Numbness, Seizures, Tingling, Tremor, Trouble walking and Weakness. Psychiatric Not Present- Anxiety, Bipolar, Change in Sleep Pattern, Depression, Fearful and Frequent crying. Endocrine Not Present- Cold Intolerance, Excessive Hunger, Hair Changes, Heat Intolerance, Hot flashes and New Diabetes. Hematology Not Present- Blood Thinners, Easy Bruising, Excessive bleeding, Gland problems, HIV and Persistent Infections.  Vitals Angela Knight CMA; 06/10/2016 11:36 AM) 06/10/2016 11:35  AM Weight: 203 lb Height: 65.5in Body Surface Area: 2 m Body Mass Index: 33.27 kg/m  Temp.: 98.35F(Temporal)  Pulse: 90 (Regular)  BP: 124/80 (Sitting, Left Arm, Standard)      Physical Exam Angela Knight; 06/10/2016 12:24 PM)  The physical exam findings are  as follows: Note:General: Obese female in NAD. Pleasant and cooperative.  HEENT: [place]/AT, no external nasal or ear masses, mucous membranes are moist  CV: RRR, no murmur, no edema  CHEST: Breath sounds equal and clear. Respirations nonlabored.  ABDOMEN: Soft, obese, nontender, nondistended, no masses  ANORECTAL: No fissures. Normal sphincter tone. Palpable irregular mass that appears to be in the left lateral side.  Anoscopy was performed and I was able to visualize some of the mass but there was a fair amount of stool in the rectal vault that obscured my vision.  SKIN: No jaundice or suspicious .  NEUROLOGIC: Alert and oriented, answers questions appropriately.  PSYCHIATRIC: Normal mood, affect , and behavior.    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 No high-grade dysplasia identified.  Plan: I recommended transanal excision. We went over the procedure and the risks. Risks including but not limited to bleeding, infection, recurrence, healing problems, anesthesia. She seems to understand all this and agrees with the plan.  Angela Confer, Knight

## 2016-07-05 NOTE — Interval H&P Note (Signed)
History and Physical Interval Note:  07/05/2016 11:57 AM  Angela Knight  has presented today for surgery, with the diagnosis of TUBULLOVILLOUS ADENOMA OF RECTUM  The various methods of treatment have been discussed with the patient and family. After consideration of risks, benefits and other options for treatment, the patient has consented to  Procedure(s): TRANSANAL EXCISION OF TUBULLOVILLOUS ADENOMA OF RECTUM (N/A) as a surgical intervention .  The patient's history has been reviewed, patient examined, no change in status, stable for surgery.  I have reviewed the patient's chart and labs.  Questions were answered to the patient's satisfaction.     Idris Edmundson Lenna Sciara

## 2016-07-05 NOTE — Transfer of Care (Signed)
Immediate Anesthesia Transfer of Care Note  Patient: Angela Knight  Procedure(s) Performed: Procedure(s): TRANSANAL EXCISION OF TUBULLOVILLOUS ADENOMA OF RECTUM (N/A)  Patient Location: PACU  Anesthesia Type:General  Level of Consciousness: awake, alert , oriented and patient cooperative  Airway & Oxygen Therapy: Patient Spontanous Breathing and Patient connected to nasal cannula oxygen  Post-op Assessment: Report given to RN and Post -op Vital signs reviewed and stable  Post vital signs: Reviewed and stable  Last Vitals:  Vitals:   07/05/16 1028  BP: 127/62  Pulse: 65  Resp: 16  Temp: 37.2 C    Last Pain:  Vitals:   07/05/16 1028  TempSrc: Oral      Patients Stated Pain Goal: 5 ([id number])  Complications: No apparent anesthesia complications

## 2016-07-05 NOTE — Anesthesia Preprocedure Evaluation (Addendum)
Anesthesia Evaluation  Patient identified by MRN, date of birth, ID band Patient awake    Reviewed: Allergy & Precautions, NPO status , Patient's Chart, lab work & pertinent test results  History of Anesthesia Complications (+) PONV and history of anesthetic complications  Airway Mallampati: I  TM Distance: >3 FB Neck ROM: Full    Dental  (+) Teeth Intact, Dental Advisory Given, Missing   Pulmonary neg pulmonary ROS,    Pulmonary exam normal breath sounds clear to auscultation       Cardiovascular negative cardio ROS Normal cardiovascular exam Rhythm:Regular Rate:Normal     Neuro/Psych negative neurological ROS  negative psych ROS   GI/Hepatic Neg liver ROS, hiatal hernia, GERD  Medicated and Controlled,dysphagia   Endo/Other  Obesity   Renal/GU negative Renal ROS     Musculoskeletal negative musculoskeletal ROS (+)   Abdominal   Peds  Hematology negative hematology ROS (+)   Anesthesia Other Findings She has severe PONV and gets sick after every anesthetic except colonoscopies with propofol  Reproductive/Obstetrics                            Anesthesia Physical  Anesthesia Plan  ASA: II  Anesthesia Plan: General   Post-op Pain Management:    Induction: Intravenous  Airway Management Planned: LMA  Additional Equipment:   Intra-op Plan:   Post-operative Plan: Extubation in OR  Informed Consent: I have reviewed the patients History and Physical, chart, labs and discussed the procedure including the risks, benefits and alternatives for the proposed anesthesia with the patient or authorized representative who has indicated his/her understanding and acceptance.   Dental advisory given  Plan Discussed with: CRNA and Anesthesiologist  Anesthesia Plan Comments: (Due to severe PONV will do GA with propofol induction and propofol infusion (TIVA) )      Anesthesia Quick  Evaluation

## 2016-07-05 NOTE — Op Note (Signed)
Operative Note  Angela Knight female 45 y.o. 07/05/2016  PREOPERATIVE DX:  Tubulovillous adenoma of rectum  POSTOPERATIVE DX:  Same  PROCEDURE:   Transanal excision of tubulovillous adenoma of rectum.         Surgeon: [name]   Assistants: none  Anesthesia: General LMA anesthesia  Indications:   This is a 45 year old female who underwent transanal excision of a tubulovillous adenoma of the rectum 10 years ago. She was undergoing surveillance colonoscopy back in July and was noted to have polypoid lesions in the rectal area. One was biopsied and was consistent with tubulovillous adenoma. She now presents for transanal excision.    Procedure Detail:  She was seen in the holding area. She is brought to the operating room placed supine on the operating table in the general anesthetic was administered. She was placed in the lithotomy position. Some uterine laxity was noted as well as a rectocele. The perianal area was sterilely prepped and draped. A timeout was performed.  Digital rectal exam demonstrated a palpable area of irregularity in the left lateral area. Anoscopy was performed and sessile-type polyps were noted from the 2:00 to 5:00 position. Marcaine and epinephrine was injected into the submucosa at this area. Using the Harmonic scalpel I excised the polypoid lesions. I then inspected the rest of the distal rectum and anus with anoscopy. No other lesions were noted. A perianal block was then performed with Exparel.  Hemostasis was adequate. A Gelfoam pack was then placed into the rectum. A dry dressing was then placed over the anus.  She tolerated procedure without any apparent complications. She was taken to the recovery room in satisfactory condition. Estimated blood loss was less than 100 cc. The specimen were sent to pathology.         Complications:  * No complications entered in OR log *         Disposition: PACU - hemodynamically stable.         Condition:  unstable

## 2016-07-05 NOTE — Anesthesia Procedure Notes (Signed)
Procedure Name: LMA Insertion Date/Time: 07/05/2016 12:25 PM Performed by: Wanita Chamberlain Pre-anesthesia Checklist: Patient identified, Timeout performed, Emergency Drugs available, Suction available and Patient being monitored Patient Re-evaluated:Patient Re-evaluated prior to inductionOxygen Delivery Method: Circle system utilized Preoxygenation: Pre-oxygenation with 100% oxygen Intubation Type: IV induction Ventilation: Mask ventilation without difficulty LMA: LMA inserted LMA Size: 4.0 Number of attempts: 1 Placement Confirmation: positive ETCO2 and breath sounds checked- equal and bilateral Tube secured with: Tape Dental Injury: Teeth and Oropharynx as per pre-operative assessment

## 2016-07-06 ENCOUNTER — Encounter (HOSPITAL_BASED_OUTPATIENT_CLINIC_OR_DEPARTMENT_OTHER): Payer: Self-pay | Admitting: General Surgery

## 2016-12-08 ENCOUNTER — Ambulatory Visit: Admit: 2016-12-08 | Payer: 59 | Admitting: Obstetrics and Gynecology

## 2016-12-08 SURGERY — ROBOTIC ASSISTED TOTAL HYSTERECTOMY WITH SALPINGECTOMY
Anesthesia: General | Laterality: Bilateral

## 2017-04-20 ENCOUNTER — Other Ambulatory Visit: Payer: Self-pay | Admitting: Obstetrics and Gynecology

## 2017-04-25 NOTE — Patient Instructions (Signed)
Your procedure is scheduled on:  Monday, May 08, 2017  Enter through the [employer] [facility] at:  11:30 AM  Pick up the phone at the desk and dial [phone].  Call this number if you have problems the morning of surgery: [phone].  Remember: Do NOT eat food:  After Midnight Sunday  Do NOT drink clear liquids after:  7:00 AM Monday  Take these medicines the morning of surgery with a SIP OF WATER:  Pepcid  Stop ALL herbal medications at this time  Do NOT smoke the day of surgery.  Do NOT wear jewelry (body piercing), metal hair clips/bobby pins, make-up, artifical eyelashes or nail polish. Do NOT wear lotions, powders, or perfumes.  You may wear deodorant. Do NOT shave for 48 hours prior to surgery. Do NOT bring valuables to the hospital. Contacts, dentures, or bridgework may not be worn into surgery.  Leave suitcase in car.  After surgery it may be brought to your room.  For patients admitted to the hospital, checkout time is 11:00 AM the day of discharge.  Bring a copy of your healthcare power of attorney and living will documents.

## 2017-04-28 ENCOUNTER — Encounter (HOSPITAL_COMMUNITY): Payer: Self-pay

## 2017-04-28 ENCOUNTER — Encounter (HOSPITAL_COMMUNITY)
Admission: RE | Admit: 2017-04-28 | Discharge: 2017-04-28 | Disposition: A | Discharge status: Home / Self Care | Payer: 59 | Source: Ambulatory Visit | Attending: Obstetrics and Gynecology | Admitting: Obstetrics and Gynecology

## 2017-04-28 DIAGNOSIS — Z01812 Encounter for preprocedural laboratory examination: Secondary | ICD-10-CM | POA: Diagnosis present

## 2017-04-28 HISTORY — DX: Motion sickness, initial encounter: T75.3XXA

## 2017-04-28 LAB — BASIC METABOLIC PANEL
Anion gap: 6 (ref 5–15)
BUN: 8 mg/dL (ref 6–20)
CHLORIDE: 104 mmol/L (ref 101–111)
CO2: 26 mmol/L (ref 22–32)
CREATININE: 0.82 mg/dL (ref 0.44–1.00)
Calcium: 10.5 mg/dL — ABNORMAL HIGH (ref 8.9–10.3)
GFR calc non Af Amer: >60 mL/min (ref >60)
Glucose, Bld: 106 mg/dL — ABNORMAL HIGH (ref 65–99)
Potassium: 4.6 mmol/L (ref 3.5–5.1)
SODIUM: 136 mmol/L (ref 135–145)

## 2017-04-28 LAB — CBC
HCT: 38.3 % (ref 36.0–46.0)
Hemoglobin: 13.1 g/dL (ref 12.0–15.0)
MCH: 27.7 pg (ref 26.0–34.0)
MCHC: 34.2 g/dL (ref 30.0–36.0)
MCV: 81 fL (ref 78.0–100.0)
PLATELETS: 312 10*3/uL (ref 150–400)
RBC: 4.73 MIL/uL (ref 3.87–5.11)
RDW: 14.1 % (ref 11.5–15.5)
WBC: 9.5 10*3/uL (ref 4.0–10.5)

## 2017-04-28 LAB — ABO/RH: ABO/RH(D): O POS

## 2017-04-28 LAB — TYPE AND SCREEN
ABO/RH(D): O POS
ANTIBODY SCREEN: NEGATIVE

## 2017-05-07 MED ORDER — CEFAZOLIN SODIUM-DEXTROSE 2-4 GM/100ML-% IV SOLN
2.0000 g | INTRAVENOUS | Status: AC
Start: 2017-05-08 — End: 2017-05-08
  Administered 2017-05-08: 2 g via INTRAVENOUS

## 2017-05-08 ENCOUNTER — Ambulatory Visit (HOSPITAL_COMMUNITY): Payer: 59 | Admitting: Anesthesiology

## 2017-05-08 ENCOUNTER — Ambulatory Visit (HOSPITAL_COMMUNITY)
Admission: RE | Admit: 2017-05-08 | Discharge: 2017-05-09 | Disposition: A | Discharge status: Home / Self Care | Payer: 59 | Source: Ambulatory Visit | Attending: Obstetrics and Gynecology | Admitting: Obstetrics and Gynecology

## 2017-05-08 ENCOUNTER — Encounter (HOSPITAL_COMMUNITY)
Admission: RE | Disposition: A | Discharge status: Home / Self Care | Payer: Self-pay | Source: Ambulatory Visit | Attending: Obstetrics and Gynecology

## 2017-05-08 ENCOUNTER — Encounter (HOSPITAL_COMMUNITY): Payer: Self-pay | Admitting: *Deleted

## 2017-05-08 DIAGNOSIS — N800 Endometriosis of uterus: Secondary | ICD-10-CM | POA: Diagnosis not present

## 2017-05-08 DIAGNOSIS — Z79899 Other long term (current) drug therapy: Secondary | ICD-10-CM | POA: Insufficient documentation

## 2017-05-08 DIAGNOSIS — K219 Gastro-esophageal reflux disease without esophagitis: Secondary | ICD-10-CM | POA: Insufficient documentation

## 2017-05-08 DIAGNOSIS — N814 Uterovaginal prolapse, unspecified: Secondary | ICD-10-CM | POA: Diagnosis present

## 2017-05-08 DIAGNOSIS — N879 Dysplasia of cervix uteri, unspecified: Secondary | ICD-10-CM | POA: Insufficient documentation

## 2017-05-08 DIAGNOSIS — N838 Other noninflammatory disorders of ovary, fallopian tube and broad ligament: Secondary | ICD-10-CM | POA: Insufficient documentation

## 2017-05-08 DIAGNOSIS — N736 Female pelvic peritoneal adhesions (postinfective): Secondary | ICD-10-CM | POA: Insufficient documentation

## 2017-05-08 DIAGNOSIS — N8189 Other female genital prolapse: Secondary | ICD-10-CM | POA: Diagnosis present

## 2017-05-08 HISTORY — PX: ANTERIOR AND POSTERIOR REPAIR: SHX5121

## 2017-05-08 HISTORY — PX: ROBOTIC ASSISTED TOTAL HYSTERECTOMY WITH SALPINGECTOMY: SHX6679

## 2017-05-08 SURGERY — ROBOTIC ASSISTED TOTAL HYSTERECTOMY WITH SALPINGECTOMY
Anesthesia: General | Site: Vagina

## 2017-05-08 MED ORDER — PROMETHAZINE HCL 25 MG/ML IJ SOLN: 6.2500 mg | INTRAMUSCULAR | Status: DC | PRN

## 2017-05-08 MED ORDER — LIDOCAINE HCL (CARDIAC) 20 MG/ML IV SOLN
INTRAVENOUS | Status: DC | PRN
  Administered 2017-05-08: 60 mg via INTRAVENOUS

## 2017-05-08 MED ORDER — SODIUM CHLORIDE 0.9% FLUSH: 9.0000 mL | INTRAVENOUS | Status: DC | PRN

## 2017-05-08 MED ORDER — ONDANSETRON HCL 4 MG/2ML IJ SOLN
4.0000 mg | Freq: Four times a day (QID) | INTRAMUSCULAR | Status: DC | PRN
  Administered 2017-05-09: 4 mg via INTRAVENOUS
  Filled 2017-05-08: qty 2

## 2017-05-08 MED ORDER — SODIUM CHLORIDE 0.9 % IV SOLN
INTRAVENOUS | Status: DC | PRN
  Administered 2017-05-08: 60 mL

## 2017-05-08 MED ORDER — DEXTROSE IN LACTATED RINGERS 5 % IV SOLN
INTRAVENOUS | Status: DC
  Administered 2017-05-09: 02:00:00 via INTRAVENOUS

## 2017-05-08 MED ORDER — LIDOCAINE HCL (CARDIAC) 20 MG/ML IV SOLN
INTRAVENOUS | Status: AC
  Filled 2017-05-08: qty 5

## 2017-05-08 MED ORDER — BUPIVACAINE HCL (PF) 0.25 % IJ SOLN
INTRAMUSCULAR | Status: AC
  Filled 2017-05-08: qty 30

## 2017-05-08 MED ORDER — ONDANSETRON HCL 4 MG/2ML IJ SOLN
INTRAMUSCULAR | Status: DC | PRN
  Administered 2017-05-08: 4 mg via INTRAVENOUS

## 2017-05-08 MED ORDER — LACTATED RINGERS IV SOLN
INTRAVENOUS | Status: DC
  Administered 2017-05-08: 12:00:00 via INTRAVENOUS
  Administered 2017-05-08: 1000 mL via INTRAVENOUS

## 2017-05-08 MED ORDER — DIPHENHYDRAMINE HCL 12.5 MG/5ML PO ELIX: 12.5000 mg | ORAL_SOLUTION | Freq: Four times a day (QID) | ORAL | Status: DC | PRN

## 2017-05-08 MED ORDER — DEXAMETHASONE SODIUM PHOSPHATE 10 MG/ML IJ SOLN
INTRAMUSCULAR | Status: AC
  Filled 2017-05-08: qty 1

## 2017-05-08 MED ORDER — MIDAZOLAM HCL 2 MG/2ML IJ SOLN
INTRAMUSCULAR | Status: AC
  Filled 2017-05-08: qty 2

## 2017-05-08 MED ORDER — FENTANYL CITRATE (PF) 100 MCG/2ML IJ SOLN
25.0000 ug | INTRAMUSCULAR | Status: DC | PRN
  Administered 2017-05-08 (×3): 50 ug via INTRAVENOUS

## 2017-05-08 MED ORDER — LABETALOL HCL 5 MG/ML IV SOLN
INTRAVENOUS | Status: DC | PRN
  Administered 2017-05-08 (×3): 5 mg via INTRAVENOUS

## 2017-05-08 MED ORDER — KETOROLAC TROMETHAMINE 30 MG/ML IJ SOLN: 30.0000 mg | Freq: Four times a day (QID) | INTRAMUSCULAR | Status: DC

## 2017-05-08 MED ORDER — BUPIVACAINE LIPOSOME 1.3 % IJ SUSP
20.0000 mL | Freq: Once | INTRAMUSCULAR | Status: AC
  Administered 2017-05-08: 20 mL
  Filled 2017-05-08: qty 20

## 2017-05-08 MED ORDER — BUPIVACAINE HCL (PF) 0.25 % IJ SOLN
INTRAMUSCULAR | Status: DC | PRN
  Administered 2017-05-08: 30 mL

## 2017-05-08 MED ORDER — GABAPENTIN 300 MG PO CAPS
ORAL_CAPSULE | ORAL | Status: AC
  Administered 2017-05-08: 300 mg via ORAL
  Filled 2017-05-08: qty 1

## 2017-05-08 MED ORDER — KETOROLAC TROMETHAMINE 30 MG/ML IJ SOLN
30.0000 mg | Freq: Four times a day (QID) | INTRAMUSCULAR | Status: DC | PRN
  Administered 2017-05-08: 30 mg via INTRAVENOUS
  Filled 2017-05-08: qty 1

## 2017-05-08 MED ORDER — SODIUM CHLORIDE 0.9 % IJ SOLN
INTRAMUSCULAR | Status: AC
  Filled 2017-05-08: qty 100

## 2017-05-08 MED ORDER — LACTATED RINGERS IV SOLN
INTRAVENOUS | Status: DC | PRN
  Administered 2017-05-08 (×2): via INTRAVENOUS

## 2017-05-08 MED ORDER — SUGAMMADEX SODIUM 200 MG/2ML IV SOLN
INTRAVENOUS | Status: AC
  Filled 2017-05-08: qty 2

## 2017-05-08 MED ORDER — ONDANSETRON HCL 4 MG/2ML IJ SOLN
INTRAMUSCULAR | Status: AC
  Filled 2017-05-08: qty 2

## 2017-05-08 MED ORDER — CELECOXIB 200 MG PO CAPS
200.0000 mg | ORAL_CAPSULE | Freq: Once | ORAL | Status: AC
  Administered 2017-05-08: 200 mg via ORAL

## 2017-05-08 MED ORDER — FENTANYL CITRATE (PF) 100 MCG/2ML IJ SOLN
INTRAMUSCULAR | Status: AC
  Filled 2017-05-08: qty 2

## 2017-05-08 MED ORDER — VASOPRESSIN 20 UNIT/ML IV SOLN
INTRAVENOUS | Status: AC
  Filled 2017-05-08: qty 1

## 2017-05-08 MED ORDER — MIDAZOLAM HCL 2 MG/2ML IJ SOLN
INTRAMUSCULAR | Status: DC | PRN
  Administered 2017-05-08: 2 mg via INTRAVENOUS

## 2017-05-08 MED ORDER — VASOPRESSIN 20 UNIT/ML IV SOLN
INTRAVENOUS | Status: DC | PRN
  Administered 2017-05-08: 16 mL via INTRAMUSCULAR

## 2017-05-08 MED ORDER — EPHEDRINE 5 MG/ML INJ
INTRAVENOUS | Status: AC
  Filled 2017-05-08: qty 10

## 2017-05-08 MED ORDER — GABAPENTIN 300 MG PO CAPS
300.0000 mg | ORAL_CAPSULE | Freq: Once | ORAL | Status: AC
  Administered 2017-05-08: 300 mg via ORAL

## 2017-05-08 MED ORDER — ACETAMINOPHEN 500 MG PO TABS
ORAL_TABLET | ORAL | Status: AC
  Administered 2017-05-08: 1000 mg via ORAL
  Filled 2017-05-08: qty 2

## 2017-05-08 MED ORDER — SUGAMMADEX SODIUM 200 MG/2ML IV SOLN
INTRAVENOUS | Status: DC | PRN
  Administered 2017-05-08: 182.4 mg via INTRAVENOUS

## 2017-05-08 MED ORDER — NALOXONE HCL 0.4 MG/ML IJ SOLN: 0.4000 mg | INTRAMUSCULAR | Status: DC | PRN

## 2017-05-08 MED ORDER — PROPOFOL 10 MG/ML IV BOLUS
INTRAVENOUS | Status: AC
  Filled 2017-05-08: qty 20

## 2017-05-08 MED ORDER — PROPOFOL 500 MG/50ML IV EMUL
INTRAVENOUS | Status: DC | PRN
  Administered 2017-05-08: 75 ug/kg/min via INTRAVENOUS

## 2017-05-08 MED ORDER — PROPOFOL 10 MG/ML IV BOLUS: INTRAVENOUS | Status: DC | PRN

## 2017-05-08 MED ORDER — FENTANYL 40 MCG/ML IV SOLN
INTRAVENOUS | Status: DC
  Administered 2017-05-08: 20:00:00 via INTRAVENOUS
  Administered 2017-05-08: 90 ug via INTRAVENOUS
  Administered 2017-05-09: 160 ug via INTRAVENOUS
  Administered 2017-05-09: 90 ug via INTRAVENOUS
  Filled 2017-05-08: qty 1000

## 2017-05-08 MED ORDER — SCOPOLAMINE 1 MG/3DAYS TD PT72
MEDICATED_PATCH | TRANSDERMAL | Status: AC
  Administered 2017-05-08: 1.5 mg via TRANSDERMAL
  Filled 2017-05-08: qty 1

## 2017-05-08 MED ORDER — FENTANYL CITRATE (PF) 250 MCG/5ML IJ SOLN
INTRAMUSCULAR | Status: AC
  Filled 2017-05-08: qty 5

## 2017-05-08 MED ORDER — ACETAMINOPHEN 10 MG/ML IV SOLN: 1000.0000 mg | Freq: Once | INTRAVENOUS | Status: DC

## 2017-05-08 MED ORDER — PROPOFOL 500 MG/50ML IV EMUL
INTRAVENOUS | Status: AC
  Filled 2017-05-08: qty 100

## 2017-05-08 MED ORDER — LACTATED RINGERS IR SOLN
Status: DC | PRN
  Administered 2017-05-08: 3000 mL

## 2017-05-08 MED ORDER — SODIUM CHLORIDE 0.9% FLUSH
INTRAVENOUS | Status: AC
  Filled 2017-05-08: qty 3

## 2017-05-08 MED ORDER — DEXAMETHASONE SODIUM PHOSPHATE 10 MG/ML IJ SOLN
INTRAMUSCULAR | Status: DC | PRN
  Administered 2017-05-08: 10 mg via INTRAVENOUS

## 2017-05-08 MED ORDER — PROPOFOL 10 MG/ML IV BOLUS
INTRAVENOUS | Status: DC | PRN
  Administered 2017-05-08: 180 mg via INTRAVENOUS

## 2017-05-08 MED ORDER — ACETAMINOPHEN 500 MG PO TABS
1000.0000 mg | ORAL_TABLET | Freq: Once | ORAL | Status: AC
  Administered 2017-05-08: 1000 mg via ORAL

## 2017-05-08 MED ORDER — ESTRADIOL 0.1 MG/GM VA CREA
TOPICAL_CREAM | VAGINAL | Status: AC
  Filled 2017-05-08: qty 42.5

## 2017-05-08 MED ORDER — ROCURONIUM BROMIDE 100 MG/10ML IV SOLN
INTRAVENOUS | Status: DC | PRN
  Administered 2017-05-08: 10 mg via INTRAVENOUS
  Administered 2017-05-08: 20 mg via INTRAVENOUS
  Administered 2017-05-08: 40 mg via INTRAVENOUS
  Administered 2017-05-08 (×2): 10 mg via INTRAVENOUS

## 2017-05-08 MED ORDER — FENTANYL CITRATE (PF) 100 MCG/2ML IJ SOLN
INTRAMUSCULAR | Status: DC | PRN
  Administered 2017-05-08 (×2): 25 ug via INTRAVENOUS
  Administered 2017-05-08 (×3): 50 ug via INTRAVENOUS

## 2017-05-08 MED ORDER — LABETALOL HCL 5 MG/ML IV SOLN
INTRAVENOUS | Status: AC
Start: 2017-05-08 — End: 2017-05-08
  Filled 2017-05-08: qty 4

## 2017-05-08 MED ORDER — CELECOXIB 200 MG PO CAPS
ORAL_CAPSULE | ORAL | Status: AC
  Administered 2017-05-08: 200 mg via ORAL
  Filled 2017-05-08: qty 1

## 2017-05-08 MED ORDER — DIPHENHYDRAMINE HCL 50 MG/ML IJ SOLN: 12.5000 mg | Freq: Four times a day (QID) | INTRAMUSCULAR | Status: DC | PRN

## 2017-05-08 MED ORDER — ROPIVACAINE HCL 5 MG/ML IJ SOLN
INTRAMUSCULAR | Status: AC
  Filled 2017-05-08: qty 30

## 2017-05-08 MED ORDER — ARTIFICIAL TEARS OPHTHALMIC OINT
TOPICAL_OINTMENT | OPHTHALMIC | Status: AC
  Filled 2017-05-08: qty 3.5

## 2017-05-08 MED ORDER — SCOPOLAMINE 1 MG/3DAYS TD PT72
1.0000 | MEDICATED_PATCH | Freq: Once | TRANSDERMAL | Status: DC
  Administered 2017-05-08: 1.5 mg via TRANSDERMAL

## 2017-05-08 MED ORDER — ROCURONIUM BROMIDE 100 MG/10ML IV SOLN
INTRAVENOUS | Status: AC
  Filled 2017-05-08: qty 1

## 2017-05-08 MED ORDER — FENTANYL CITRATE (PF) 100 MCG/2ML IJ SOLN
100.0000 ug | Freq: Once | INTRAMUSCULAR | Status: AC
  Administered 2017-05-08: 100 ug via INTRAVENOUS

## 2017-05-08 SURGICAL SUPPLY — 70 items
ADH SKN CLS APL DERMABOND .7 (GAUZE/BANDAGES/DRESSINGS) ×2
BARRIER ADHS 3X4 INTERCEED (GAUZE/BANDAGES/DRESSINGS) IMPLANT
BRR ADH 4X3 ABS CNTRL BYND (GAUZE/BANDAGES/DRESSINGS)
CATH FOLEY 3WAY  5CC 16FR (CATHETERS) ×2
CATH FOLEY 3WAY 5CC 16FR (CATHETERS) ×2 IMPLANT
CLOTH BEACON ORANGE TIMEOUT ST (SAFETY) ×4 IMPLANT
CONT PATH 16OZ SNAP LID 3702 (MISCELLANEOUS) ×4 IMPLANT
COVER BACK TABLE 60X90IN (DRAPES) ×8 IMPLANT
COVER TIP SHEARS 8 DVNC (MISCELLANEOUS) ×2 IMPLANT
COVER TIP SHEARS 8MM DA VINCI (MISCELLANEOUS) ×2
DECANTER SPIKE VIAL GLASS SM (MISCELLANEOUS) ×16 IMPLANT
DERMABOND ADVANCED (GAUZE/BANDAGES/DRESSINGS) ×2
DERMABOND ADVANCED .7 DNX12 (GAUZE/BANDAGES/DRESSINGS) ×2 IMPLANT
DURAPREP 26ML APPLICATOR (WOUND CARE) ×4 IMPLANT
ELECT NDL TIP 2.8 STRL (NEEDLE) IMPLANT
ELECT NEEDLE TIP 2.8 STRL (NEEDLE) IMPLANT
ELECT REM PT RETURN 9FT ADLT (ELECTROSURGICAL) ×4
ELECTRODE REM PT RTRN 9FT ADLT (ELECTROSURGICAL) ×2 IMPLANT
GAUZE PACKING 1 X5 YD ST (GAUZE/BANDAGES/DRESSINGS) ×3 IMPLANT
GAUZE PACKING 2X5 YD STRL (GAUZE/BANDAGES/DRESSINGS) IMPLANT
GAUZE VASELINE 3X9 (GAUZE/BANDAGES/DRESSINGS) IMPLANT
GLOVE BIO SURGEON STRL SZ7.5 (GLOVE) ×12 IMPLANT
GLOVE BIOGEL PI IND STRL 7.0 (GLOVE) ×4 IMPLANT
GLOVE BIOGEL PI INDICATOR 7.0 (GLOVE) ×4
GOWN STRL REUS W/TWL LRG LVL3 (GOWN DISPOSABLE) ×16 IMPLANT
KIT ACCESSORY DA VINCI DISP (KITS) ×2
KIT ACCESSORY DVNC DISP (KITS) ×2 IMPLANT
LEGGING LITHOTOMY PAIR STRL (DRAPES) ×4 IMPLANT
NEEDLE HYPO 22GX1.5 SAFETY (NEEDLE) ×3 IMPLANT
NEEDLE INSUFFLATION 150MM (ENDOMECHANICALS) ×4 IMPLANT
NS IRRIG 1000ML POUR BTL (IV SOLUTION) ×4 IMPLANT
OCCLUDER COLPOPNEUMO (BALLOONS) ×4 IMPLANT
PACK ROBOT WH (CUSTOM PROCEDURE TRAY) ×4 IMPLANT
PACK ROBOTIC GOWN (GOWN DISPOSABLE) ×4 IMPLANT
PACK TRENDGUARD 450 HYBRID PRO (MISCELLANEOUS) IMPLANT
PACK TRENDGUARD 600 HYBRD PROC (MISCELLANEOUS) IMPLANT
PACK VAGINAL WOMENS (CUSTOM PROCEDURE TRAY) ×4 IMPLANT
PAD PREP 24X48 CUFFED NSTRL (MISCELLANEOUS) ×4 IMPLANT
PEN SKIN MARKING STD PT W/RULR (MISCELLANEOUS) ×3 IMPLANT
POUCH LAPAROSCOPIC INSTRUMENT (MISCELLANEOUS) IMPLANT
PROTECTOR NERVE ULNAR (MISCELLANEOUS) ×8 IMPLANT
SET CYSTO W/LG BORE CLAMP LF (SET/KITS/TRAYS/PACK) IMPLANT
SET IRRIG TUBING LAPAROSCOPIC (IRRIGATION / IRRIGATOR) ×4 IMPLANT
SET TRI-LUMEN FLTR TB AIRSEAL (TUBING) ×4 IMPLANT
SUT ETHIBOND 0 (SUTURE) ×3 IMPLANT
SUT MON AB 2-0 CT2 27 (SUTURE) IMPLANT
SUT VIC AB 0 CT1 27 (SUTURE) ×8
SUT VIC AB 0 CT1 27XBRD ANBCTR (SUTURE) ×4 IMPLANT
SUT VIC AB 2-0 CT1 27 (SUTURE)
SUT VIC AB 2-0 CT1 TAPERPNT 27 (SUTURE) IMPLANT
SUT VICRYL 0 UR6 27IN ABS (SUTURE) ×4 IMPLANT
SUT VICRYL 3 0 CT 3 (SUTURE) IMPLANT
SUT VICRYL RAPIDE 4/0 PS 2 (SUTURE) ×8 IMPLANT
SUT VLOC 180 0 9IN  GS21 (SUTURE) ×6
SUT VLOC 180 0 9IN GS21 (SUTURE) IMPLANT
SYR 50ML LL SCALE MARK (SYRINGE) ×4 IMPLANT
SYRINGE 10CC LL (SYRINGE) ×8 IMPLANT
TIP RUMI ORANGE 6.7MMX12CM (TIP) IMPLANT
TIP UTERINE 5.1X6CM LAV DISP (MISCELLANEOUS) IMPLANT
TIP UTERINE 6.7X10CM GRN DISP (MISCELLANEOUS) IMPLANT
TIP UTERINE 6.7X6CM WHT DISP (MISCELLANEOUS) IMPLANT
TIP UTERINE 6.7X8CM BLUE DISP (MISCELLANEOUS) ×2 IMPLANT
TOWEL OR 17X24 6PK STRL BLUE (TOWEL DISPOSABLE) ×12 IMPLANT
TRAY FOLEY CATH SILVER 14FR (SET/KITS/TRAYS/PACK) ×4 IMPLANT
TRENDGUARD 450 HYBRID PRO PACK (MISCELLANEOUS) ×4
TRENDGUARD 600 HYBRID PROC PK (MISCELLANEOUS)
TROCAR DISP BLADELESS 8 DVNC (TROCAR) ×2 IMPLANT
TROCAR DISP BLADELESS 8MM (TROCAR) ×2
TROCAR PORT AIRSEAL 5X120 (TROCAR) ×4 IMPLANT
TROCAR Z-THREAD 12X150 (TROCAR) ×4 IMPLANT

## 2017-05-08 NOTE — Op Note (Signed)
NAMERogelio Seen NO.:  [id number]  MEDICAL RECORD NO.:  [id number]  LOCATION:                                 FACILITY:  PHYSICIAN:  Lovenia Kim, M.D.     DATE OF BIRTH:  DATE OF PROCEDURE:  05/08/2017 DATE OF DISCHARGE:                              OPERATIVE REPORT   PREOPERATIVE DIAGNOSES:  Uterine prolapse, cystocele, rectocele, enterocele, perineal relaxation.  POSTOPERATIVE DIAGNOSES:  Uterine prolapse, cystocele, rectocele, enterocele, perineal relaxation plus pelvic adhesions.  PROCEDURE:  Robotic-assisted total laparoscopic hysterectomy, bilateral salpingectomy, lysis of left adnexal adhesions to the left fallopian tube, anterior colporrhaphy, posterior colporrhaphy, perineorrhaphy, McCall culdoplasty, and uterosacral ligament suspension.  SURGEON:  Lovenia Kim, M.D.  ASSISTANT:  Floyce Stakes. Pamala Hurry, MD.  ESTIMATED BLOOD LOSS:  100 mL.  COMPLICATIONS:  None.  DRAINS:  Foley.  COUNTS:  Correct.  DISPOSITION:  The patient to recovery in good condition.  BRIEF OPERATIVE NOTE:  After being apprised of risks of anesthesia, infection, bleeding, injury to surrounding organs, possible need for repair, delayed versus immediate complications to include bowel and bladder injury, possible need for repair, the patient was brought to the operating room.  She was administered general anesthetic without complications.  Prepped and draped in usual sterile fashion.  Foley catheter placed.  Exam under anesthesia revealed a bulky retroflexed uterus.  No adnexal masses.  Cystocele, rectocele, and enterocele were confirmed.  At this time, Rumi retractor was placed vaginally without difficulty.  The infraumbilical incision was made with a scalpel. Veress needle placed to opening pressure of -5.  3 L CO2 insufflated without difficulty.  Trocar placed atraumatically.  Pictures taken. Visualization once again revealing findings as noted.  In  addition, there is normal liver, gallbladder bed, and normal-appearing appendiceal area.  There were multiple paratubal cysts on the left and adhesions of the left adnexa to the left pelvic sidewall.  At this time, 2 ports were made on the left, 1 port on the right.  Robotic ports placed.  AirSeal port placed.  Deep Trendelenburg position established and robot docked in standard fashion.  Endoshears and PK forceps were placed.  The left adnexal adhesions were lysed sharply.  The left tube was undermined. Mesosalpinx divided, and tube divided.  Retroperitoneal space entered on the left.  Tubo-ovarian ligament on the left skeletonized, divided, and cut.  The retroperitoneal space was entered.  The ureter was skeletonized and identified peristalsing on the left pelvic sidewall. The left round ligament was opened.  Bladder flap was developed sharply and the left uterine vessels skeletonized, cauterized, and cut.  At this time, on the right side, the right tube was undermined.  Mesosalpinx was divided.  The retroperitoneal space was entered.  The tubo-ovarian ligament was skeletonized and cauterized using bipolar cautery and divided.  The retroperitoneal space was entered.  The ureter was seen peristalsing normally along the lateral pelvic sidewall.  The round ligament opened and dissected.  The bladder flap further developed.  The right uterine vessel was cauterized after being skeletonized, bipolar cautery, and cut.  The specimen was then divided.  The cervicovaginal junction extracted in the vagina without difficulty.  The vagina then closed using 0 V-Loc suture in continuous running fashion.  Second imbricating layer placed.  At this time, a McCall culdoplasty suture placed.  Uterosacral ligament suspension performed using a 0 V-Loc suture on the right in the lateral medial fashion, plicating the right uterosacral ligament to the vaginal cuff.  Same procedure done on the left.  0 Ethibond  permanent sutures placed in the uterosacral ligaments bilaterally, and a 3-0 V-Loc suture was used to repair vaginal cuff. The ureters were seen peristalsing normally and bilaterally.  At this time, good hemostasis noted.  At this time, irrigation was accomplished. All instruments were removed under direct visualization.  CO2 released, and all trocars were removed.  Incisions closed using 0 Vicryl, 4-0 Vicryl, Dermabond.  Dilute Marcaine, Exparel solution placed vaginally. Anterior repair was made making a linear incision entering into the pubovesical cervical fascia and plicating the cystocele using a 2-0 Vicryl suture closure in an interrupted fashion using the same suture. Posteriorly, a triangular shaped perineum was excised and the rectocele was undermined and identified during rectal exam.  The perirectal fascia was then divided and the vagina was carefully dissected to reduce the rectocele.  Enterocele was encountered too.  Both were plicated using 2- 0 Vicryl suture.  A 0 Vicryl suture placed in the perineum for support to re-establish perineal body closure after trimming the vagina.  This was done with 2-0 Vicryl suture and perineorrhaphy was also accomplished using 2-0 Vicryl suture in a standard fashion.  Good hemostasis was noted.  Vaginal packing was placed.  After the procedure, called back to the operating room for questionably some spotting, which was noted to be from a raw edge along the posterior repair.  Pressure was applied and good hemostasis was noted.  The patient tolerated the procedure well and was then transferred to recovery in good condition.     Lovenia Kim, M.D.     RJT/MEDQ  D:  05/08/2017  T:  05/08/2017  Job:  [id number]  cc:   [facility]

## 2017-05-08 NOTE — Progress Notes (Signed)
Patient ID: Angela Knight, female   DOB: [birth year], 46 y.o.   MRN: [id number] Patient seen and examined. Consent witnessed and signed. No changes noted. Update completed.

## 2017-05-08 NOTE — Anesthesia Postprocedure Evaluation (Signed)
Anesthesia Post Note  Patient: TANGEE MARSZALEK  Procedure(s) Performed: Procedure(s) (LRB): ROBOTIC ASSISTED TOTAL HYSTERECTOMY WITH SALPINGECTOMY And Uterosacral Ligament Suspension (Bilateral) ANTERIOR Colporrhaphy (CYSTOCELE) AND POSTERIOR REPAIR (RECTOCELE), PERINEALORRHAPHY (N/A)     Patient location during evaluation: PACU Anesthesia Type: General Level of consciousness: awake and alert Pain management: pain level controlled Vital Signs Assessment: post-procedure vital signs reviewed and stable Respiratory status: spontaneous breathing, nonlabored ventilation, respiratory function stable and patient connected to nasal cannula oxygen Cardiovascular status: blood pressure returned to baseline and stable Postop Assessment: no signs of nausea or vomiting Anesthetic complications: no    Last Vitals:  Vitals:   05/08/17 1730 05/08/17 1745  BP: 112/61 115/60  Pulse: 69 72  Resp: 11 16  Temp:      Last Pain:  Vitals:   05/08/17 1745  TempSrc:   PainSc: Asleep   Pain Goal: Patients Stated Pain Goal: 4 (05/08/17 1745)               Catalina Gravel

## 2017-05-08 NOTE — Transfer of Care (Signed)
Immediate Anesthesia Transfer of Care Note  Patient: Angela Knight  Procedure(s) Performed: Procedure(s): ROBOTIC ASSISTED TOTAL HYSTERECTOMY WITH SALPINGECTOMY And Uterosacral Ligament Suspension (Bilateral) ANTERIOR Colporrhaphy (CYSTOCELE) AND POSTERIOR REPAIR (RECTOCELE), PERINEALORRHAPHY (N/A)  Patient Location: PACU  Anesthesia Type:General  Level of Consciousness: awake, alert  and oriented  Airway & Oxygen Therapy: Patient Spontanous Breathing and Patient connected to nasal cannula oxygen  Post-op Assessment: Report given to RN, Post -op Vital signs reviewed and stable and Patient moving all extremities  Post vital signs: Reviewed and stable  Last Vitals:  Vitals:   05/08/17 1145  BP: 140/85  Pulse: 66  Resp: 16  Temp: 36.9 C    Last Pain:  Vitals:   05/08/17 1145  TempSrc: Oral      Patients Stated Pain Goal: 4 (41/28/78 6767)  Complications: No apparent anesthesia complications

## 2017-05-08 NOTE — H&P (Signed)
NAME:  Angela Knight, Angela Knight NO.:  [id number]  MEDICAL RECORD NO.:  [id number]  LOCATION:                                 FACILITY:  PHYSICIAN:  Lovenia Kim, M.D.     DATE OF BIRTH:  DATE OF ADMISSION: DATE OF DISCHARGE:                             HISTORY & PHYSICAL   CHIEF COMPLAINT:  Symptomatic pelvic relaxation with cystocele, rectocele, uterine prolapse.  HISTORY OF PRESENT ILLNESS:  She is a 46 year old Caucasian female, G9, P6, who presents with aforementioned complaints for definitive surgery.  MEDICATIONS:  Pepcid.  PAST SURGICAL HISTORY:  Removal of rectal polyps in 2017, breast biopsy 2017.  SOCIAL HISTORY:  Noncontributory.  OBSTETRIC HISTORY:  Remarkable for vaginal delivery x6.  ALLERGIES:  Pseudoephedrine, codeine, and Monistat.  FAMILY HISTORY:  Breast cancer, skin cancer, prostate cancer, diabetes, hypertension.  PHYSICAL EXAMINATION:  GENERAL:  A well-developed, well-nourished white female, in no acute distress. HEENT:  Normal. NECK:  Supple.  Full range of motion. LUNGS:  Clear. HEART:  Regular rate and rhythm. ABDOMEN:  Soft, nontender. PELVIC:  Reveals mild-to-moderate cystocele, mild-to-moderate rectocele with perineal relaxation and grade 1-2 uterine prolapse. EXTREMITIES:  No cords. NEUROLOGIC: Nonfocal. SKIN:  Intact.  IMPRESSION:  Symptomatic pelvic relaxation with cystocele, rectocele, perineal relaxation, and apical vaginal prolapse.  PLAN:  Proceed with da Vinci assisted total laparoscopic hysterectomy, bilateral salpingectomy, uterosacral ligament suspension, cystocele, rectocele repair, perineorrhaphy, possible cystoscopy.  Risks of anesthesia, infection, bleeding, injury to surrounding organs, possible need for repair discussed, delayed versus immediate complications to include bowel and bladder injury noted.  The patient acknowledges and wished to proceed.     Lovenia Kim, M.D.     RJT/MEDQ  D:   05/07/2017  T:  05/08/2017  Job:  [id number]

## 2017-05-08 NOTE — Anesthesia Preprocedure Evaluation (Addendum)
Anesthesia Evaluation  Patient identified by MRN, date of birth, ID band Patient awake    Reviewed: Allergy & Precautions, NPO status , Patient's Chart, lab work & pertinent test results  History of Anesthesia Complications (+) PONV, Family history of anesthesia reaction and history of anesthetic complications  Airway Mallampati: II  TM Distance: >3 FB Neck ROM: Full    Dental  (+) Teeth Intact, Dental Advisory Given   Pulmonary neg pulmonary ROS,    Pulmonary exam normal breath sounds clear to auscultation       Cardiovascular Exercise Tolerance: Good negative cardio ROS Normal cardiovascular exam Rhythm:Regular Rate:Normal     Neuro/Psych negative neurological ROS     GI/Hepatic Neg liver ROS, GERD  Medicated,Esophageal stricture s/p dilatation   Endo/Other  negative endocrine ROS  Renal/GU negative Renal ROS   Uterine Prolapse, Cystocele, Rectocele    Musculoskeletal negative musculoskeletal ROS (+)   Abdominal   Peds  Hematology negative hematology ROS (+)   Anesthesia Other Findings Day of surgery medications reviewed with the patient.  Reproductive/Obstetrics negative OB ROS                             Anesthesia Physical Anesthesia Plan  ASA: II  Anesthesia Plan: General   Post-op Pain Management:    Induction: Intravenous  PONV Risk Score and Plan: 4 or greater and Ondansetron, Dexamethasone, Propofol, Midazolam and Scopolamine patch - Pre-op  Airway Management Planned: Oral ETT  Additional Equipment:   Intra-op Plan:   Post-operative Plan: Extubation in OR  Informed Consent: I have reviewed the patients History and Physical, chart, labs and discussed the procedure including the risks, benefits and alternatives for the proposed anesthesia with the patient or authorized representative who has indicated his/her understanding and acceptance.   Dental advisory  given  Plan Discussed with: CRNA  Anesthesia Plan Comments: (2nd IV following induction.  Plan for propofol infusion for TIVA due to history of severe PONV.)       Anesthesia Quick Evaluation

## 2017-05-08 NOTE — Anesthesia Procedure Notes (Signed)
Procedure Name: Intubation Date/Time: 05/08/2017 1:00 PM Performed by: Casimer Lanius A Pre-anesthesia Checklist: Patient identified, Patient being monitored, Timeout performed, Emergency Drugs available and Suction available Patient Re-evaluated:Patient Re-evaluated prior to induction Oxygen Delivery Method: Circle System Utilized Preoxygenation: Pre-oxygenation with 100% oxygen Induction Type: IV induction and Cricoid Pressure applied Ventilation: Mask ventilation without difficulty Laryngoscope Size: Mac and 3 Grade View: Grade II Tube type: Oral Tube size: 7.0 mm Number of attempts: 1 Airway Equipment and Method: stylet Placement Confirmation: ETT inserted through vocal cords under direct vision,  positive ETCO2 and breath sounds checked- equal and bilateral Secured at: 21 cm Tube secured with: Tape Dental Injury: Teeth and Oropharynx as per pre-operative assessment

## 2017-05-08 NOTE — OR Nursing (Signed)
Dr Ronita Hipps called back to OR to evaluate bleeding from vagina noted post-op. Dr Ronita Hipps examined area of concern and stated nothing further needed and new peri-pad applied. Patient taken to PACU. Report given to PACU RN.

## 2017-05-08 NOTE — Op Note (Signed)
05/08/2017  4:06 PM  PATIENT:  Angela Knight  46 y.o. female  PRE-OPERATIVE DIAGNOSIS:  Uterine Prolapse, Cystocele, Rectocele  POST-OPERATIVE DIAGNOSIS:  Uterine Prolapse, Cystocele, Rectocele  PROCEDURE:  Procedure(s): ROBOTIC ASSISTED TOTAL HYSTERECTOMY WITH SALPINGECTOMY And Uterosacral Ligament Suspension ANTERIOR Colporrhaphy (CYSTOCELE) AND POSTERIOR REPAIR (RECTOCELE), PERINEORRHAPHY LYSIS OF ADHESIONS, MCCALL CUL DE PLASTY  SURGEON:  Surgeon(s): Brien Few, MD Aloha Gell, MD  ASSISTANTS: Pamala Hurry, MD   ANESTHESIA:   local and general  ESTIMATED BLOOD LOSS: * No blood loss amount entered *   DRAINS: Urinary Catheter (Foley)   LOCAL MEDICATIONS USED:  MARCAINE    and Amount: 30 ml WITH EXPAREL  SPECIMEN:  Source of Specimen:  UTERUS, CERVIX AND BILATERAL TUBES  DISPOSITION OF SPECIMEN:  PATHOLOGY  COUNTS:  YES  DICTATION #: PENDING  PLAN OF CARE: DC IN AM  PATIENT DISPOSITION:  PACU - hemodynamically stable.

## 2017-05-09 ENCOUNTER — Encounter (HOSPITAL_COMMUNITY): Payer: Self-pay | Admitting: Obstetrics and Gynecology

## 2017-05-09 DIAGNOSIS — N814 Uterovaginal prolapse, unspecified: Secondary | ICD-10-CM | POA: Diagnosis not present

## 2017-05-09 LAB — CBC
HCT: 34.5 % — ABNORMAL LOW (ref 36.0–46.0)
HEMOGLOBIN: 11.8 g/dL — AB (ref 12.0–15.0)
MCH: 27.5 pg (ref 26.0–34.0)
MCHC: 34.2 g/dL (ref 30.0–36.0)
MCV: 80.4 fL (ref 78.0–100.0)
Platelets: 320 10*3/uL (ref 150–400)
RBC: 4.29 MIL/uL (ref 3.87–5.11)
RDW: 14.5 % (ref 11.5–15.5)
WBC: 12.2 10*3/uL — ABNORMAL HIGH (ref 4.0–10.5)

## 2017-05-09 LAB — BASIC METABOLIC PANEL
Anion gap: 6 (ref 5–15)
BUN: 8 mg/dL (ref 6–20)
CHLORIDE: 104 mmol/L (ref 101–111)
CO2: 25 mmol/L (ref 22–32)
Calcium: 8.7 mg/dL — ABNORMAL LOW (ref 8.9–10.3)
Creatinine, Ser: 0.73 mg/dL (ref 0.44–1.00)
GFR calc Af Amer: >60 mL/min (ref >60)
GFR calc non Af Amer: >60 mL/min (ref >60)
GLUCOSE: 171 mg/dL — AB (ref 65–99)
POTASSIUM: 4.1 mmol/L (ref 3.5–5.1)
SODIUM: 135 mmol/L (ref 135–145)

## 2017-05-09 MED ORDER — IBUPROFEN 800 MG PO TABS: 800.0000 mg | ORAL_TABLET | Freq: Three times a day (TID) | ORAL | 0 refills | Status: DC

## 2017-05-09 MED ORDER — IBUPROFEN 800 MG PO TABS
800.0000 mg | ORAL_TABLET | Freq: Three times a day (TID) | ORAL | Status: DC
  Administered 2017-05-09: 800 mg via ORAL
  Filled 2017-05-09: qty 1

## 2017-05-09 MED ORDER — ACETAMINOPHEN 325 MG PO TABS
650.0000 mg | ORAL_TABLET | Freq: Four times a day (QID) | ORAL | Status: DC | PRN
  Administered 2017-05-09: 650 mg via ORAL
  Filled 2017-05-09: qty 2

## 2017-05-09 MED ORDER — HYDROMORPHONE HCL 2 MG PO TABS: 1.0000 mg | ORAL_TABLET | Freq: Four times a day (QID) | ORAL | 0 refills | Status: DC | PRN

## 2017-05-09 MED ORDER — HYDROMORPHONE HCL 2 MG PO TABS
1.0000 mg | ORAL_TABLET | ORAL | Status: DC | PRN
  Filled 2017-05-09: qty 1

## 2017-05-09 MED ORDER — FENTANYL CITRATE (PF) 100 MCG/2ML IJ SOLN: 50.0000 ug | INTRAMUSCULAR | Status: DC | PRN

## 2017-05-09 NOTE — Progress Notes (Signed)
Fentanyl pca wasted 15 cc in sink [employer] Witnessed waste of fentanyl 15cc,Tanya Corbitt,R.N.

## 2017-05-09 NOTE — Anesthesia Postprocedure Evaluation (Signed)
Anesthesia Post Note  Patient: Angela Knight  Procedure(s) Performed: Procedure(s) (LRB): ROBOTIC ASSISTED TOTAL HYSTERECTOMY WITH SALPINGECTOMY And Uterosacral Ligament Suspension (Bilateral) ANTERIOR Colporrhaphy (CYSTOCELE) AND POSTERIOR REPAIR (RECTOCELE), PERINEALORRHAPHY (N/A)     Patient location during evaluation: [facility] Anesthesia Type: General Level of consciousness: awake Pain management: satisfactory to patient Vital Signs Assessment: post-procedure vital signs reviewed and stable Respiratory status: spontaneous breathing Cardiovascular status: stable Anesthetic complications: no    Last Vitals:  Vitals:   05/09/17 0429 05/09/17 0614  BP: (!) 113/57   Pulse: 64   Resp: 16 18  Temp: 37.1 C     Last Pain:  Vitals:   05/09/17 0745  TempSrc:   PainSc: 5    Pain Goal: Patients Stated Pain Goal: 4 (05/09/17 0745)               Casimer Lanius

## 2017-05-09 NOTE — Progress Notes (Signed)
1 Day Post-Op Procedure(s) (LRB): ROBOTIC ASSISTED TOTAL HYSTERECTOMY WITH SALPINGECTOMY And Uterosacral Ligament Suspension (Bilateral) ANTERIOR Colporrhaphy (CYSTOCELE) AND POSTERIOR REPAIR (RECTOCELE), PERINEALORRHAPHY (N/A)  Subjective: Patient reports nausea, incisional pain, tolerating PO, + flatus and no problems voiding.    Objective: I have reviewed patient's vital signs, intake and output, medications and labs. BP (!) 113/57 (BP Location: Left Arm)   Pulse 64   Temp 98.7 F (37.1 C) (Oral)   Resp 16   LMP 04/27/2017 (Approximate)   SpO2 96%   CBC    Component Value Date/Time   WBC 12.2 (H) 05/09/2017 0457   RBC 4.29 05/09/2017 0457   HGB 11.8 (L) 05/09/2017 0457   HCT 34.5 (L) 05/09/2017 0457   PLT 320 05/09/2017 0457   MCV 80.4 05/09/2017 0457   MCH 27.5 05/09/2017 0457   MCHC 34.2 05/09/2017 0457   RDW 14.5 05/09/2017 0457   LYMPHSABS 3.7 07/01/2016 1106   MONOABS 0.8 07/01/2016 1106   EOSABS 0.2 07/01/2016 1106   BASOSABS 0.0 07/01/2016 1106     General: alert, cooperative and appears stated age Resp: clear to auscultation bilaterally and normal percussion bilaterally Cardio: regular rate and rhythm, S1, S2 normal, no murmur, click, rub or gallop GI: soft, non-tender; bowel sounds normal; no masses,  no organomegaly and incision: clean, dry and intact Extremities: extremities normal, atraumatic, no cyanosis or edema Vaginal Bleeding: minimal  Assessment: s/p Procedure(s): ROBOTIC ASSISTED TOTAL HYSTERECTOMY WITH SALPINGECTOMY And Uterosacral Ligament Suspension (Bilateral) ANTERIOR Colporrhaphy (CYSTOCELE) AND POSTERIOR REPAIR (RECTOCELE), PERINEALORRHAPHY (N/A): stable, progressing well and tolerating diet  Plan: Advance diet Encourage ambulation Advance to PO medication Discontinue IV fluids Discharge home  LOS: 1 day    Primus Gritton J 05/09/2017, 6:02 AM

## 2017-05-09 NOTE — Progress Notes (Signed)
Dr. Ronita Hipps on phone updated on pt status, foley out packing out.   Pt slept some during the night. Had nausea given zofran.  Pt walked to BR had to go back to bed due to nausea. Orders rec.

## 2017-05-09 NOTE — Addendum Note (Signed)
Addendum  created 05/09/17 0803 by Asher Muir, CRNA   Sign clinical note

## 2017-05-09 NOTE — Progress Notes (Signed)
Vaginal packing removed and foley d/c.

## 2019-09-17 ENCOUNTER — Other Ambulatory Visit: Payer: Self-pay

## 2019-09-17 DIAGNOSIS — Z20822 Contact with and (suspected) exposure to covid-19: Secondary | ICD-10-CM

## 2019-09-18 LAB — NOVEL CORONAVIRUS, NAA: SARS-CoV-2, NAA: NOT DETECTED

## 2021-11-04 ENCOUNTER — Emergency Department (HOSPITAL_COMMUNITY): Payer: BC Managed Care – PPO

## 2021-11-04 ENCOUNTER — Emergency Department (HOSPITAL_COMMUNITY)
Admission: EM | Admit: 2021-11-04 | Discharge: 2021-11-05 | Disposition: A | Discharge status: Home / Self Care | Payer: BC Managed Care – PPO | Attending: Emergency Medicine | Admitting: Emergency Medicine

## 2021-11-04 ENCOUNTER — Other Ambulatory Visit: Payer: Self-pay

## 2021-11-04 DIAGNOSIS — R1011 Right upper quadrant pain: Secondary | ICD-10-CM | POA: Diagnosis present

## 2021-11-04 LAB — CBC WITH DIFFERENTIAL/PLATELET
Abs Immature Granulocytes: 0.03 10*3/uL (ref 0.00–0.07)
Basophils Absolute: 0 10*3/uL (ref 0.0–0.1)
Basophils Relative: 0 %
Eosinophils Absolute: 0.3 10*3/uL (ref 0.0–0.5)
Eosinophils Relative: 3 %
HCT: 40.5 % (ref 36.0–46.0)
Hemoglobin: 14.4 g/dL (ref 12.0–15.0)
Immature Granulocytes: 0 %
Lymphocytes Relative: 46 %
Lymphs Abs: 3.6 10*3/uL (ref 0.7–4.0)
MCH: 30.9 pg (ref 26.0–34.0)
MCHC: 35.6 g/dL (ref 30.0–36.0)
MCV: 86.9 fL (ref 80.0–100.0)
Monocytes Absolute: 0.6 10*3/uL (ref 0.1–1.0)
Monocytes Relative: 8 %
Neutro Abs: 3.5 10*3/uL (ref 1.7–7.7)
Neutrophils Relative %: 43 %
Platelets: 296 10*3/uL (ref 150–400)
RBC: 4.66 MIL/uL (ref 3.87–5.11)
RDW: 12.7 % (ref 11.5–15.5)
WBC: 8 10*3/uL (ref 4.0–10.5)
nRBC: 0 % (ref 0.0–0.2)

## 2021-11-04 LAB — URINALYSIS, ROUTINE W REFLEX MICROSCOPIC
Bacteria, UA: NONE SEEN
Bilirubin Urine: NEGATIVE
Glucose, UA: >=500 mg/dL — AB
Hgb urine dipstick: NEGATIVE
Ketones, ur: 5 mg/dL — AB
Leukocytes,Ua: NEGATIVE
Nitrite: NEGATIVE
Protein, ur: NEGATIVE mg/dL
Specific Gravity, Urine: 1.023 (ref 1.005–1.030)
pH: 5 (ref 5.0–8.0)

## 2021-11-04 LAB — COMPREHENSIVE METABOLIC PANEL
ALT: 27 U/L (ref 0–44)
AST: 27 U/L (ref 15–41)
Albumin: 4.1 g/dL (ref 3.5–5.0)
Alkaline Phosphatase: 42 U/L (ref 38–126)
Anion gap: 11 (ref 5–15)
BUN: 9 mg/dL (ref 6–20)
CO2: 24 mmol/L (ref 22–32)
Calcium: 10 mg/dL (ref 8.9–10.3)
Chloride: 101 mmol/L (ref 98–111)
Creatinine, Ser: 0.8 mg/dL (ref 0.44–1.00)
GFR, Estimated: >60 mL/min (ref >60)
Glucose, Bld: 219 mg/dL — ABNORMAL HIGH (ref 70–99)
Potassium: 3.9 mmol/L (ref 3.5–5.1)
Sodium: 136 mmol/L (ref 135–145)
Total Bilirubin: 0.8 mg/dL (ref 0.3–1.2)
Total Protein: 6.7 g/dL (ref 6.5–8.1)

## 2021-11-04 LAB — LIPASE, BLOOD: Lipase: 27 U/L (ref 11–51)

## 2021-11-04 NOTE — ED Provider Triage Note (Signed)
Emergency Medicine Provider Triage Evaluation Note  Angela Knight , a 51 y.o. female  was evaluated in triage.  Pt complains of right upper quadrant abdominal pain.  Pain is in right upper quadrant, wraps around to the right flank.  Patient states she has had these symptoms on and off for the past month.  This current episode ongoing for couple of hours.  Has been constant.  She was sent over here by urgent care to get evaluated for gallbladder disorder.  She has never been worked up for this before.  Review of Systems  Positive: Abdominal pain Negative:   Physical Exam  BP (!) 167/88 (BP Location: Right Arm)    Pulse 62    Temp 98 F (36.7 C)    Resp 16    SpO2 99%  Gen:   Awake, no distress   Resp:  Normal effort  MSK:   Moves extremities without difficulty  Other:  RUQ and RCVA ttp is fairly significant.   Medical Decision Making  Medically screening exam initiated at 8:56 PM.  Appropriate orders placed.  AMISADAI WOODFORD was informed that the remainder of the evaluation will be completed by another provider, this initial triage assessment does not replace that evaluation, and the importance of remaining in the ED until their evaluation is complete.   Abdominal labs, RUQ [place]   Adolphus Birchwood, PA-C 11/04/21 2057

## 2021-11-04 NOTE — ED Triage Notes (Signed)
Pt reported to ED with  c/o RUQ pain that radiates around right side into back. Pt states theses symptoms have been waxing and waning for 1 month, however became very severe yesterday and has endured. Pt reports hx of GERD and states that her normal medication has not been helping. Was evaluated at urgent care today and directed to ED for concerns of gallbladder.

## 2021-11-05 ENCOUNTER — Encounter (HOSPITAL_COMMUNITY): Payer: Self-pay | Admitting: Emergency Medicine

## 2021-11-05 MED ORDER — ONDANSETRON 4 MG PO TBDP: 4.0000 mg | ORAL_TABLET | Freq: Three times a day (TID) | ORAL | 0 refills | Status: DC | PRN

## 2021-11-05 NOTE — Discharge Instructions (Signed)
Please follow-up with your primary care provider for the issues we discussed today.  As I mentioned, the ultrasound was reassuring for any significant problems with the gallbladder at this time.  I will also write you for Zofran for nausea as we discussed.  Is return to the emergency department if you experience any worsening symptoms.  Thank you for letting me participate in your care today.

## 2021-11-05 NOTE — ED Provider Notes (Signed)
[facility] EMERGENCY DEPARTMENT Provider Note   CSN: [id number] Arrival date & time: 11/04/21  2043     History Chief Complaint  Patient presents with   Abdominal Pain    Angela Knight is a 51 y.o. female with history of severe GERD and gestational diabetes who presents to the emergency department with a 1 month history of intermittent right upper quadrant abdominal pain.  She states that over the last 24 hours she had similar pain that was quite severe.  She states that these episodes typically occur in the evening after meals.  She also states she has GERD severe to the point that she has vomited acid.  She was seen evaluated urgent care who instructed her to come to the emergency department for further evaluation.  No fever or chills.  She states that her right upper quad abdominal pain radiates to the back and was sharp and stabbing.   Abdominal Pain     Home Medications Prior to Admission medications   Medication Sig Start Date End Date Taking? Authorizing Provider  ondansetron (ZOFRAN-ODT) 4 MG disintegrating tablet Take 1 tablet (4 mg total) by mouth every 8 (eight) hours as needed for nausea or vomiting. 11/05/21  Yes Myna Bright M, PA-C  famotidine-calcium carbonate-magnesium hydroxide (PEPCID COMPLETE) 10-800-165 MG chewable tablet Chew 1 tablet by mouth every evening.     [provider]  HYDROmorphone (DILAUDID) 2 MG tablet Take 0.5-1 tablets (1-2 mg total) by mouth every 6 (six) hours as needed for severe pain. 05/09/17   Brien Few, MD  ibuprofen (ADVIL,MOTRIN) 800 MG tablet Take 1 tablet (800 mg total) by mouth 3 (three) times daily. 05/09/17   Brien Few, MD      Allergies    Codeine, Other, Pseudoephedrine, and Surgical lubricant    Review of Systems   Review of Systems  Gastrointestinal:  Positive for abdominal pain.  All other systems reviewed and are negative.  Physical Exam Updated Vital Signs BP 139/84 (BP Location:  Right Arm)    Pulse 66    Temp 98.3 F (36.8 C) (Oral)    Resp 20    Ht 5\' 6"  (1.676 m)    Wt 93 kg    SpO2 98%    BMI 33.09 kg/m  Physical Exam Vitals and nursing note reviewed.  Constitutional:      General: She is not in acute distress.    Appearance: Normal appearance.  HENT:     Head: Normocephalic and atraumatic.  Eyes:     General:        Right eye: No discharge.        Left eye: No discharge.  Cardiovascular:     Comments: Regular rate and rhythm.  S1/S2 are distinct without any evidence of murmur, rubs, or gallops.  Radial pulses are 2+ bilaterally.  Dorsalis pedis pulses are 2+ bilaterally.  No evidence of pedal edema. Pulmonary:     Comments: Clear to auscultation bilaterally.  Normal effort.  No respiratory distress.  No evidence of wheezes, rales, or rhonchi heard throughout. Abdominal:     General: Abdomen is flat. Bowel sounds are normal. There is no distension.     Tenderness: There is abdominal tenderness in the right upper quadrant and epigastric area. There is no guarding or rebound.  Musculoskeletal:        General: Normal range of motion.     Cervical back: Neck supple.  Skin:    General: Skin is warm and  dry.     Findings: No rash.  Neurological:     General: No focal deficit present.     Mental Status: She is alert.  Psychiatric:        Mood and Affect: Mood normal.        Behavior: Behavior normal.    ED Results / Procedures / Treatments   Labs (all labs ordered are listed, but only abnormal results are displayed) Labs Reviewed  COMPREHENSIVE METABOLIC PANEL - Abnormal; Notable for the following components:      Result Value   Glucose, Bld 219 (*)    All other components within normal limits  URINALYSIS, ROUTINE W REFLEX MICROSCOPIC - Abnormal; Notable for the following components:   Glucose, UA >=500 (*)    Ketones, ur 5 (*)    All other components within normal limits  CBC WITH DIFFERENTIAL/PLATELET  LIPASE, BLOOD     EKG None  Radiology US Abdomen Limited RUQ (LIVER/GB)  Result Date: 11/04/2021 CLINICAL DATA:  Right upper quadrant pain. EXAM: ULTRASOUND ABDOMEN LIMITED RIGHT UPPER QUADRANT COMPARISON:  None. FINDINGS: Gallbladder: Physiologically distended. No gallstones or wall thickening visualized. No sonographic Murphy sign noted by sonographer. Common bile duct: Diameter: 3 mm. Liver: No focal lesion identified. Diffusely increased and heterogeneous in parenchymal echogenicity. No capsular nodularity. Portal vein is patent on color Doppler imaging with normal direction of blood flow towards the liver. Other: No right upper quadrant ascites. IMPRESSION: 1. Normal sonographic appearance of the gallbladder and biliary tree. 2. Hepatic steatosis. Electronically Signed   By: Keith Rake M.D.   On: 11/04/2021 21:28    Procedures Procedures    Medications Ordered in ED Medications - No data to display  ED Course/ Medical Decision Making/ A&P                           Medical Decision Making  Angela Knight is a 51 y.o. female with significant comorbidities to impact her care to include severe GERD and gestational diabetes who presents to the emergency department today for right upper quadrant abdominal pain.  Patient was in the waiting room for 17 hours. Differential diagnosis includes cholelithiasis versus cholecystitis.  I considered however believe less likely kidney pathology given that she does not have any flank tenderness or no new urinary symptoms.  Patient does endorse urinary frequency since she was a child but this is not a new problem.  I also considered exacerbation of GERD which still could be likely.  Also considered however less likely pneumonia given that she has had no respiratory symptoms or fever.  Initial work-up was ordered in triage including labs and imaging which is personally interpreted by myself.  CBC was without any evidence of leukocytosis or anemia.  CMP did show  elevated glucose.  Lipase was negative.  UA showed evidence of glucose but was otherwise without infection.  Gallbladder ultrasound did not reveal any signs of gallbladder wall thickening or distention.  I agree with radiologist of rotation.  Patient's pain improved while she was in the waiting for 17 hours.  I discussed the findings of her labs and imaging with her and her significant other at the bedside.  They expressed full understanding.  We discussed that her sugar was high today and that she needs to have this addressed with her primary care provider.  The exact cause of her right upper quadrant pain is unclear at this time however there is no evidence  of cholecystitis or cholelithiasis.  I wrote her a prescription for Zofran for her nausea and vomiting.  Given the clinical scenario, I do believe she is safe for outpatient follow-up with your primary care provider. She is in no acute distress and her pain has improved.  I will have her follow-up in the next week.  Strict return precautions were given.  She is safe for discharge.  Final Clinical Impression(s) / ED Diagnoses Final diagnoses:  RUQ abdominal pain  Right upper quadrant abdominal pain    Rx / DC Orders ED Discharge Orders          Ordered    ondansetron (ZOFRAN-ODT) 4 MG disintegrating tablet  Every 8 hours PRN        11/05/21 1358              Myna Bright [place], [place] 11/05/21 1418    Sherwood Gambler, MD 11/06/21 [phone]

## 2021-11-05 NOTE — ED Notes (Signed)
Pt ambulatory to waiting room. Pt verbalized understanding of discharge instructions.   

## 2023-03-14 ENCOUNTER — Emergency Department (HOSPITAL_BASED_OUTPATIENT_CLINIC_OR_DEPARTMENT_OTHER): Payer: BC Managed Care – PPO

## 2023-03-14 ENCOUNTER — Other Ambulatory Visit: Payer: Self-pay

## 2023-03-14 ENCOUNTER — Encounter (HOSPITAL_BASED_OUTPATIENT_CLINIC_OR_DEPARTMENT_OTHER): Payer: Self-pay

## 2023-03-14 ENCOUNTER — Inpatient Hospital Stay (HOSPITAL_BASED_OUTPATIENT_CLINIC_OR_DEPARTMENT_OTHER)
Admission: EM | Admit: 2023-03-14 | Discharge: 2023-03-18 | DRG: 322 | Disposition: A | Discharge status: Home / Self Care | Payer: BC Managed Care – PPO | Attending: Internal Medicine | Admitting: Internal Medicine

## 2023-03-14 DIAGNOSIS — I2510 Atherosclerotic heart disease of native coronary artery without angina pectoris: Secondary | ICD-10-CM | POA: Diagnosis present

## 2023-03-14 DIAGNOSIS — I214 Non-ST elevation (NSTEMI) myocardial infarction: Secondary | ICD-10-CM | POA: Diagnosis present

## 2023-03-14 DIAGNOSIS — Z79899 Other long term (current) drug therapy: Secondary | ICD-10-CM

## 2023-03-14 DIAGNOSIS — Z955 Presence of coronary angioplasty implant and graft: Secondary | ICD-10-CM

## 2023-03-14 DIAGNOSIS — Z885 Allergy status to narcotic agent status: Secondary | ICD-10-CM | POA: Diagnosis not present

## 2023-03-14 DIAGNOSIS — E669 Obesity, unspecified: Secondary | ICD-10-CM | POA: Diagnosis present

## 2023-03-14 DIAGNOSIS — Z6833 Body mass index (BMI) 33.0-33.9, adult: Secondary | ICD-10-CM | POA: Diagnosis not present

## 2023-03-14 DIAGNOSIS — E785 Hyperlipidemia, unspecified: Secondary | ICD-10-CM | POA: Diagnosis not present

## 2023-03-14 DIAGNOSIS — E119 Type 2 diabetes mellitus without complications: Secondary | ICD-10-CM | POA: Diagnosis present

## 2023-03-14 DIAGNOSIS — R079 Chest pain, unspecified: Secondary | ICD-10-CM | POA: Diagnosis present

## 2023-03-14 DIAGNOSIS — E7800 Pure hypercholesterolemia, unspecified: Secondary | ICD-10-CM | POA: Diagnosis present

## 2023-03-14 DIAGNOSIS — I10 Essential (primary) hypertension: Secondary | ICD-10-CM | POA: Diagnosis present

## 2023-03-14 DIAGNOSIS — E871 Hypo-osmolality and hyponatremia: Secondary | ICD-10-CM | POA: Diagnosis present

## 2023-03-14 DIAGNOSIS — Z888 Allergy status to other drugs, medicaments and biological substances status: Secondary | ICD-10-CM | POA: Diagnosis not present

## 2023-03-14 DIAGNOSIS — K219 Gastro-esophageal reflux disease without esophagitis: Secondary | ICD-10-CM | POA: Diagnosis present

## 2023-03-14 LAB — CBC WITH DIFFERENTIAL/PLATELET
Abs Immature Granulocytes: 0.02 10*3/uL (ref 0.00–0.07)
Basophils Absolute: 0 10*3/uL (ref 0.0–0.1)
Basophils Relative: 0 %
Eosinophils Absolute: 0.2 10*3/uL (ref 0.0–0.5)
Eosinophils Relative: 2 %
HCT: 40.5 % (ref 36.0–46.0)
Hemoglobin: 14.1 g/dL (ref 12.0–15.0)
Immature Granulocytes: 0 %
Lymphocytes Relative: 38 %
Lymphs Abs: 3.6 10*3/uL (ref 0.7–4.0)
MCH: 30.1 pg (ref 26.0–34.0)
MCHC: 34.8 g/dL (ref 30.0–36.0)
MCV: 86.4 fL (ref 80.0–100.0)
Monocytes Absolute: 0.6 10*3/uL (ref 0.1–1.0)
Monocytes Relative: 7 %
Neutro Abs: 4.8 10*3/uL (ref 1.7–7.7)
Neutrophils Relative %: 53 %
Platelets: 317 10*3/uL (ref 150–400)
RBC: 4.69 MIL/uL (ref 3.87–5.11)
RDW: 13.6 % (ref 11.5–15.5)
WBC: 9.3 10*3/uL (ref 4.0–10.5)
nRBC: 0 % (ref 0.0–0.2)

## 2023-03-14 LAB — COMPREHENSIVE METABOLIC PANEL
ALT: 31 U/L (ref 0–44)
AST: 23 U/L (ref 15–41)
Albumin: 4.9 g/dL (ref 3.5–5.0)
Alkaline Phosphatase: 44 U/L (ref 38–126)
Anion gap: 10 (ref 5–15)
BUN: 11 mg/dL (ref 6–20)
CO2: 23 mmol/L (ref 22–32)
Calcium: 10.1 mg/dL (ref 8.9–10.3)
Chloride: 104 mmol/L (ref 98–111)
Creatinine, Ser: 0.7 mg/dL (ref 0.44–1.00)
GFR, Estimated: >60 mL/min (ref >60)
Glucose, Bld: 109 mg/dL — ABNORMAL HIGH (ref 70–99)
Potassium: 3.8 mmol/L (ref 3.5–5.1)
Sodium: 137 mmol/L (ref 135–145)
Total Bilirubin: 0.4 mg/dL (ref 0.3–1.2)
Total Protein: 7.2 g/dL (ref 6.5–8.1)

## 2023-03-14 LAB — TROPONIN I (HIGH SENSITIVITY)
Troponin I (High Sensitivity): 57 ng/L — ABNORMAL HIGH (ref <18)
Troponin I (High Sensitivity): 80 ng/L — ABNORMAL HIGH (ref <18)
Troponin I (High Sensitivity): 94 ng/L — ABNORMAL HIGH (ref <18)
Troponin I (High Sensitivity): 122 ng/L (ref <18)

## 2023-03-14 LAB — LIPASE, BLOOD: Lipase: <10 U/L — ABNORMAL LOW (ref 11–51)

## 2023-03-14 MED ORDER — HEPARIN BOLUS VIA INFUSION
4000.0000 [IU] | Freq: Once | INTRAVENOUS | Status: AC
  Administered 2023-03-14: 4000 [IU] via INTRAVENOUS

## 2023-03-14 MED ORDER — ASPIRIN 81 MG PO CHEW
324.0000 mg | CHEWABLE_TABLET | Freq: Once | ORAL | Status: AC
  Administered 2023-03-14: 324 mg via ORAL
  Filled 2023-03-14: qty 4

## 2023-03-14 MED ORDER — IOHEXOL 350 MG/ML SOLN
75.0000 mL | Freq: Once | INTRAVENOUS | Status: AC | PRN
  Administered 2023-03-14: 75 mL via INTRAVENOUS

## 2023-03-14 MED ORDER — HEPARIN (PORCINE) 25000 UT/250ML-% IV SOLN
1400.0000 [IU]/h | INTRAVENOUS | Status: DC
  Administered 2023-03-14: 1000 [IU]/h via INTRAVENOUS
  Administered 2023-03-15: 1400 [IU]/h via INTRAVENOUS
  Filled 2023-03-14 (×2): qty 250

## 2023-03-14 NOTE — ED Notes (Signed)
Called [facility] to transport patient to [facility] 6E rm#[id number]

## 2023-03-14 NOTE — Progress Notes (Signed)
Hospitalist Transfer Note:  Transferring facility: [facility] Requesting provider: Dr. Lockie Mola (EDP at [facility]) Reason for transfer: admission for further evaluation and management of chest pain, NSTEMI.     52 y.o. female, with history of hypertension, hyperlipidemia, but no history of known coronary artery disease who presented to [facility] ED complaining of 3 days of intermittent exertional chest pain associated with shortness of breath, imrpvong with rest; each episode is spontaneously resolved, and she has not required any nitroglycerin.  No chest pain throughout [facility] course.   Labs were notable for initial troponin of 57, with repeat value trending up to this 86.  Imaging notable for CTA chest which showed no evidence of acute process, including no evidence of pulmonary embolism.  [facility] EDP d/w on-call cards (Dr. Anne Fu) who conveyed that cards will formally consult.   Medications administered prior to transfer included the following: full dose aspirin x 1. Heparin drip initiated.   Subsequently, I accepted this patient for transfer for admission to a cardiac-tele bed at [facility] for further work-up and management of the above.       Nursing staff, Please call [facility] Admits & Consults System-Wide number on Amion [phone]) as soon as patient's arrival, so appropriate admitting provider can evaluate the pt.     Newton Pigg, DO Hospitalist

## 2023-03-14 NOTE — ED Triage Notes (Signed)
Patient here POV from Home.  Endorses Sunday beginning to have Bilateral Shoulder and Neck pain along with Heaviness in Chest. Yesterday began to have Heaviness and Numbness to Bilateral Arms.   Some SOB with Exertion. No N/V/D.   NAD Noted during Triage. A&Ox4. Gcs 15. Ambulatory.

## 2023-03-14 NOTE — ED Notes (Signed)
Pt ambulatory with steady gait to restroom 

## 2023-03-14 NOTE — ED Notes (Addendum)
Pt denies pain at rest, states pain and shob occurs when ambulating. Reports pain as radiating bilaterally to upper extremities from central chest

## 2023-03-14 NOTE — ED Notes (Addendum)
Patient transported to CT 

## 2023-03-14 NOTE — Progress Notes (Signed)
   03/14/23 2334  Provider Notification  Provider Name/Title Opyd MD  Date Provider Notified 03/14/23  Time Provider Notified 2334  Method of Notification Page  Notification Reason Critical Result (Trop 122)  Test performed and critical result Trop 122  Date Critical Result Received 03/14/23  Time Critical Result Received 2332  Provider response No new orders  Date of Provider Response 03/14/23  Time of Provider Response 2334

## 2023-03-14 NOTE — Consult Note (Signed)
Cardiology Consultation   Patient ID: [name] MRN: [id number]; DOB: 02/08/1971  Admit date: 03/14/2023 Date of Consult: 03/14/2023  PCP:  Delorse Lek, MD   [facility] Providers Cardiologist:  None        Patient Profile:   Angela Knight is a 52 y.o. female with a hx of HTN, type 2 DM and HLD who is being seen 03/14/2023 for the evaluation of chest pain at the request of IM.  History of Present Illness:   Angela Knight is a 52 year old woman who started complaining of retrosternal pressure like sensation radiating to the left should on Sunday morning. Her symptoms were mostly related with exertion and relieved with rest. Yesterday, she developed symptoms while at work (patient works as a Warden/ranger) with concomitant shortness of breath. No diaphoresis or nausea. The chest pain was triggered by exertion and relieved with rest, lasting 5-10 min.  ER vitals BP 151/87, HR 84, RR 18, SaO2 98% RA. Labs relevant for Trop 57 -> 80 -> 94 Glucose 106 CTPE -ve.   Loaded with ASA/heparin and transferred for further care.  Past Medical History:  Diagnosis Date   Car sickness    Complication of anesthesia    severe   Family history of adverse reaction to anesthesia    PONV   GERD (gastroesophageal reflux disease)    occ. OTC med used   History of esophageal stricture    stenosis dilatation 05-02-2016   History of gastric polyp    benign 05-02-2016   PONV (postoperative nausea and vomiting)    SVD (spontaneous vaginal delivery)    x 6   Tubulovillous adenoma of rectum    multiple recurrent's   Wears glasses     Past Surgical History:  Procedure Laterality Date   ANTERIOR AND POSTERIOR REPAIR N/A 05/08/2017   Procedure: ANTERIOR Colporrhaphy (CYSTOCELE) AND POSTERIOR REPAIR (RECTOCELE), PERINEALORRHAPHY;  Surgeon: Olivia Mackie, MD;  Location: [facility];  Service: Gynecology;  Laterality: N/A;   COLONOSCOPY WITH PROPOFOL N/A 05/02/2016   Procedure:  COLONOSCOPY WITH PROPOFOL;  Surgeon: Charolett Bumpers, MD;  Location: [facility];  Service: Endoscopy;  Laterality: N/A;   ESOPHAGOGASTRODUODENOSCOPY (EGD) WITH PROPOFOL N/A 05/02/2016   Procedure: ESOPHAGOGASTRODUODENOSCOPY (EGD) WITH PROPOFOL;  Surgeon: Charolett Bumpers, MD;  Location: [facility];  Service: Endoscopy;  Laterality: N/A;   ROBOTIC ASSISTED TOTAL HYSTERECTOMY WITH SALPINGECTOMY Bilateral 05/08/2017   Procedure: ROBOTIC ASSISTED TOTAL HYSTERECTOMY WITH SALPINGECTOMY And Uterosacral Ligament Suspension;  Surgeon: Olivia Mackie, MD;  Location: [facility];  Service: Gynecology;  Laterality: Bilateral;   SIGMOIDOSCOPY  multiple--   TRANSANAL EXCISION OF RECTAL MASS  06-12-2003 and 05-24-2004   tubulovillious adenoma   TUMOR EXCISION N/A 07/05/2016   Procedure: TRANSANAL EXCISION OF TUBULLOVILLOUS ADENOMA OF RECTUM;  Surgeon: Avel Peace, MD;  Location: [facility];  Service: General;  Laterality: N/A;     Home Medications:  Prior to Admission medications   Medication Sig Start Date End Date Taking? Authorizing Provider  famotidine-calcium carbonate-magnesium hydroxide (PEPCID COMPLETE) [phone] MG chewable tablet Chew 1 tablet by mouth every evening.     [provider]  HYDROmorphone (DILAUDID) 2 MG tablet Take 0.5-1 tablets (1-2 mg total) by mouth every 6 (six) hours as needed for severe pain. 05/09/17   Olivia Mackie, MD  ibuprofen (ADVIL,MOTRIN) 800 MG tablet Take 1 tablet (800 mg total) by mouth 3 (three) times daily. 05/09/17   Olivia Mackie, MD  ondansetron (  ZOFRAN-ODT) 4 MG disintegrating tablet Take 1 tablet (4 mg total) by mouth every 8 (eight) hours as needed for nausea or vomiting. 11/05/21   Teressa Lower, PA-C    Inpatient Medications: Scheduled Meds:  Continuous Infusions:  heparin 1,000 Units/hr (03/14/23 1945)   PRN Meds:   Allergies:    Allergies  Allergen Reactions   Caffeine Other (See Comments)    Religious dietary     Codeine Nausea And Vomiting   Other     All narcotics causes extreme vomitting   Pseudoephedrine Palpitations   Surgical Lubricant Itching and Rash    Surgical glue    Social History:   Social History   Socioeconomic History   Marital status: Married    Spouse name: Not on file   Number of children: Not on file   Years of education: Not on file   Highest education level: Not on file  Occupational History   Not on file  Tobacco Use   Smoking status: Never   Smokeless tobacco: Never  Vaping Use   Vaping Use: Never used  Substance and Sexual Activity   Alcohol use: No   Drug use: No   Sexual activity: Yes    Birth control/protection: Condom  Other Topics Concern   Not on file  Social History Narrative   Not on file   Social Determinants of Health   Financial Resource Strain: Not on file  Food Insecurity: Not on file  Transportation Needs: Not on file  Physical Activity: Not on file  Stress: Not on file  Social Connections: Not on file  Intimate Partner Violence: Not on file    Family History:   History reviewed. No pertinent family history.   ROS: Please see the history of present illness.   All other ROS reviewed and negative.     Physical Exam/Data:   Vitals:   03/14/23 2000 03/14/23 2030 03/14/23 2100 03/14/23 2229  BP: (!) 147/91 (!) 141/80 (!) 105/95 128/71  Pulse: 68 70 87 70  Resp: 18  20 17   Temp: 98 F (36.7 C)  98.4 F (36.9 C) 99.1 F (37.3 C)  TempSrc:   Oral Oral  SpO2: 97% 97% 94% 95%  Weight:    93.3 kg  Height:    5\' 6"  (1.676 m)   No intake or output data in the 24 hours ending 03/14/23 2251    03/14/2023   10:29 PM 03/14/2023    3:16 PM 11/04/2021    8:59 PM  Last 3 Weights  Weight (lbs) 205 lb 11.2 oz 200 lb 205 lb  Weight (kg) 93.305 kg 90.719 kg 92.987 kg     Body mass index is 33.2 kg/m.  General:  Well nourished, well developed, in no acute distress HEENT: normal Neck: no JVD Vascular: No carotid bruits; Distal  pulses 2+ bilaterally Cardiac:  normal S1, S2; RRR; no murmur  Lungs:  clear to auscultation bilaterally, no wheezing, rhonchi or rales  Abd: soft, nontender, no hepatomegaly  Ext: no edema Musculoskeletal:  No deformities, BUE and BLE strength normal and equal Skin: warm and dry  Neuro:  CNs 2-12 intact, no focal abnormalities noted Psych:  Normal affect   EKG:  The EKG was personally reviewed and demonstrates:  TWI inferior leads  Laboratory Data:  High Sensitivity Troponin:   Recent Labs  Lab 03/14/23 1511 03/14/23 1728 03/14/23 1929  TROPONINIHS 57* 80* 94*     Chemistry Recent Labs  Lab 03/14/23 1511  NA 137  K 3.8  CL 104  CO2 23  GLUCOSE 109*  BUN 11  CREATININE 0.70  CALCIUM 10.1  GFRNONAA >60  ANIONGAP 10    Recent Labs  Lab 03/14/23 1511  PROT 7.2  ALBUMIN 4.9  AST 23  ALT 31  ALKPHOS 44  BILITOT 0.4   Lipids No results for input(s): "CHOL", "TRIG", "HDL", "LABVLDL", "LDLCALC", "CHOLHDL" in the last 168 hours.  Hematology Recent Labs  Lab 03/14/23 1511  WBC 9.3  RBC 4.69  HGB 14.1  HCT 40.5  MCV 86.4  MCH 30.1  MCHC 34.8  RDW 13.6  PLT 317   Thyroid No results for input(s): "TSH", "FREET4" in the last 168 hours.  BNPNo results for input(s): "BNP", "PROBNP" in the last 168 hours.  DDimer No results for input(s): "DDIMER" in the last 168 hours.   Radiology/Studies:  CT Angio Chest PE W and/or Wo Contrast  Result Date: 03/14/2023 CLINICAL DATA:  Bilateral shoulder and neck pain with heaviness in chest. EXAM: CT ANGIOGRAPHY CHEST WITH CONTRAST TECHNIQUE: Multidetector CT imaging of the chest was performed using the standard protocol during bolus administration of intravenous contrast. Multiplanar CT image reconstructions and MIPs were obtained to evaluate the vascular anatomy. RADIATION DOSE REDUCTION: This exam was performed according to the departmental dose-optimization program which includes automated exposure control, adjustment of the  mA and/or kV according to patient size and/or use of iterative reconstruction technique. CONTRAST:  75mL OMNIPAQUE IOHEXOL 350 MG/ML SOLN COMPARISON:  Chest x-ray earlier 03/14/2023 FINDINGS: Cardiovascular: Heart is nonenlarged. No pericardial effusion. The thoracic aorta has a normal course and caliber minimal atherosclerotic change. No segmental or larger pulmonary embolism identified. Mediastinum/Nodes: No enlarged mediastinal, hilar, or axillary lymph nodes. Thyroid gland, trachea, and esophagus demonstrate no significant findings. Small hiatal hernia. Lungs/Pleura: There is some mild breathing motion identified. No consolidation, pneumothorax or effusion. Azygous fissure. Upper Abdomen: No acute abnormality.  Fatty liver infiltration. Musculoskeletal: Mild curvature and degenerative changes along the spine. Review of the MIP images confirms the above findings. IMPRESSION: No pulmonary embolism identified. Small hiatal hernia. Aortic Atherosclerosis (ICD10-I70.0). Electronically Signed   By: Karen Kays M.D.   On: 03/14/2023 17:39   DG Chest Portable 1 View  Result Date: 03/14/2023 CLINICAL DATA:  Neck and bilateral shoulder pain with chest heaviness for 2 days. EXAM: PORTABLE CHEST 1 VIEW COMPARISON:  None Available. FINDINGS: The heart size and mediastinal contours are normal. The lungs are clear. There is no pleural effusion or pneumothorax. No acute osseous findings are identified. There are degenerative changes in the thoracic spine. There are calcifications lateral to the left humeral head which could reflect hydroxyapatite deposition. IMPRESSION: No evidence of acute cardiopulmonary process. Possible hydroxyapatite deposition adjacent to the left humeral head. Electronically Signed   By: Carey Bullocks M.D.   On: 03/14/2023 15:30     Assessment and Plan:   52 year old with HTN and HLD admitted with NSTEMI  # NSTEMI: - Typical symptoms  - Has several risk factors including DM, HTN and HLD.   - No Fhx of CAD, non smoker. - ECG with inferior TWI - Chest pain free on arrival - Trop trend 57 -> 80 -> 94 - TIMI score of 4 Plan: - Loaded with ASA and heparin bolus given - Continue ASA 81 mg daily and heparin gtt - Atorva 80 mg daily - Echo in the morning - Lopressor 25 mg BID - NPO after midnight for possible LHC/PCI - Temetry monitoring  #  Type 2 DM not on insulin # HTN # HLD  Risk Assessment/Risk Scores:     TIMI Risk Score for Unstable Angina or Non-ST Elevation MI:   The patient's TIMI risk score is  , which indicates a  % risk of all cause mortality, new or recurrent myocardial infarction or need for urgent revascularization in the next 14 days.          For questions or updates, please contact [facility] Please consult [email or web address] for contact info under    Signed, Regan Rakers, MD  03/14/2023 10:51 PM

## 2023-03-14 NOTE — Progress Notes (Signed)
ANTICOAGULATION CONSULT NOTE - Initial Consult  Pharmacy Consult for heparin Indication: chest pain/ACS  Allergies  Allergen Reactions   Codeine Nausea And Vomiting   Other     All narcotics causes extreme vomitting   Pseudoephedrine Palpitations   Surgical Lubricant Itching and Rash    Surgical glue    Patient Measurements: Height: 5\' 6"  (167.6 cm) Weight: 90.7 kg (200 lb) IBW/kg (Calculated) : 59.3 Heparin Dosing Weight: 79.1 kg  Vital Signs: Temp: 98.6 F (37 C) (05/21 1514) Temp Source: Oral (05/21 1514) BP: 125/69 (05/21 1830) Pulse Rate: 72 (05/21 1830)  Labs: Recent Labs    03/14/23 1511 03/14/23 1728  HGB 14.1  --   HCT 40.5  --   PLT 317  --   CREATININE 0.70  --   TROPONINIHS 57* 80*    Estimated Creatinine Clearance: 93.4 mL/min (by C-G formula based on SCr of 0.7 mg/dL).   Medical History: Past Medical History:  Diagnosis Date   Car sickness    Complication of anesthesia    severe   Family history of adverse reaction to anesthesia    PONV   GERD (gastroesophageal reflux disease)    occ. OTC med used   History of esophageal stricture    stenosis dilatation 05-02-2016   History of gastric polyp    benign 05-02-2016   PONV (postoperative nausea and vomiting)    SVD (spontaneous vaginal delivery)    x 6   Tubulovillous adenoma of rectum    multiple recurrent's   Wears glasses     Medications:  (Not in a hospital admission)  Scheduled:   heparin  4,000 Units Intravenous Once   Infusions:   heparin      Assessment: Patient presented with bilateral shoulder/neck pain, chest heaviness, and SOB on exertion. Not on anticoagulation PTA. No significant cardiac history noted. HS-troponin 57>80. CBC WNL.  Goal of Therapy:  Heparin level 0.3-0.7 units/ml Monitor platelets by anticoagulation protocol: Yes   Plan:  Give heparin 4000 unit bolus Start heparin infusion at 1000 units/hr Heparin level in 6 hours Daily heparin level and  CBC  Thank you for involving pharmacy in this patient's care.  Enos Fling, PharmD PGY2 Pharmacy Resident 03/14/2023 7:06 PM

## 2023-03-14 NOTE — ED Notes (Signed)
Patient transported to CT 

## 2023-03-14 NOTE — ED Notes (Signed)
ED Provider at bedside. 

## 2023-03-14 NOTE — ED Notes (Signed)
Pt placed into gown and on cardiac monitor, call light within reach. Patient updated on plan of care. Will continue to monitor patient.  

## 2023-03-14 NOTE — ED Provider Notes (Signed)
[place] EMERGENCY DEPARTMENT AT [facility] Provider Note   CSN: [id number] Arrival date & time: 03/14/23  1503     History  Chief Complaint  Patient presents with   Chest Pain    Angela Knight is a 52 y.o. female.  Patient here with exertional chest pain here the last few days.  With shortness of breath as well.  She has a history of hypertension, high cholesterol diabetes.  No cardiac history otherwise.  No family history of heart disease at a young age.  Denies any recent surgery or travel.  She does take an estrogen patch.  She denies any nausea vomiting diarrhea.  The history is provided by the patient.       Home Medications Prior to Admission medications   Medication Sig Start Date End Date Taking? Authorizing Provider  famotidine-calcium carbonate-magnesium hydroxide (PEPCID COMPLETE) [phone] MG chewable tablet Chew 1 tablet by mouth every evening.     [provider]  HYDROmorphone (DILAUDID) 2 MG tablet Take 0.5-1 tablets (1-2 mg total) by mouth every 6 (six) hours as needed for severe pain. 05/09/17   Olivia Mackie, MD  ibuprofen (ADVIL,MOTRIN) 800 MG tablet Take 1 tablet (800 mg total) by mouth 3 (three) times daily. 05/09/17   Olivia Mackie, MD  ondansetron (ZOFRAN-ODT) 4 MG disintegrating tablet Take 1 tablet (4 mg total) by mouth every 8 (eight) hours as needed for nausea or vomiting. 11/05/21   Teressa Lower, PA-C      Allergies    Codeine, Other, Pseudoephedrine, and Surgical lubricant    Review of Systems   Review of Systems  Physical Exam Updated Vital Signs BP (!) 142/82   Pulse 81   Temp 98.6 F (37 C) (Oral)   Resp 20   Ht 5\' 6"  (1.676 m)   Wt 90.7 kg   LMP 04/27/2017 (Approximate)   SpO2 97%   BMI 32.28 kg/m  Physical Exam Vitals and nursing note reviewed.  Constitutional:      General: She is not in acute distress.    Appearance: She is well-developed. She is not ill-appearing.  HENT:     Head:  Normocephalic and atraumatic.  Eyes:     Extraocular Movements: Extraocular movements intact.     Conjunctiva/sclera: Conjunctivae normal.     Pupils: Pupils are equal, round, and reactive to light.  Cardiovascular:     Rate and Rhythm: Normal rate and regular rhythm.     Pulses:          Radial pulses are 2+ on the right side and 2+ on the left side.     Heart sounds: Normal heart sounds. No murmur heard. Pulmonary:     Effort: Pulmonary effort is normal. No respiratory distress.     Breath sounds: Normal breath sounds. No decreased breath sounds, wheezing or rhonchi.  Abdominal:     Palpations: Abdomen is soft.     Tenderness: There is no abdominal tenderness.  Musculoskeletal:        General: No swelling.     Cervical back: Normal range of motion and neck supple.  Skin:    General: Skin is warm and dry.     Capillary Refill: Capillary refill takes less than 2 seconds.  Neurological:     General: No focal deficit present.     Mental Status: She is alert.  Psychiatric:        Mood and Affect: Mood normal.     ED Results / Procedures /  Treatments   Labs (all labs ordered are listed, but only abnormal results are displayed) Labs Reviewed  COMPREHENSIVE METABOLIC PANEL - Abnormal; Notable for the following components:      Result Value   Glucose, Bld 109 (*)    All other components within normal limits  LIPASE, BLOOD - Abnormal; Notable for the following components:   Lipase <10 (*)    All other components within normal limits  TROPONIN I (HIGH SENSITIVITY) - Abnormal; Notable for the following components:   Troponin I (High Sensitivity) 57 (*)    All other components within normal limits  TROPONIN I (HIGH SENSITIVITY) - Abnormal; Notable for the following components:   Troponin I (High Sensitivity) 80 (*)    All other components within normal limits  CBC WITH DIFFERENTIAL/PLATELET  HEPARIN LEVEL (UNFRACTIONATED)  CBC    EKG EKG Interpretation  Date/Time:  Tuesday  Mar 14 2023 15:16:42 EDT Ventricular Rate:  78 PR Interval:  152 QRS Duration: 72 QT Interval:  368 QTC Calculation: 419 R Axis:   57 Text Interpretation: Normal sinus rhythm t wave inversions v4-v6 No previous ECGs available Reconfirmed by Virgina Norfolk ([id number]) on 03/14/2023 4:20:45 PM  Radiology CT Angio Chest PE W and/or Wo Contrast  Result Date: 03/14/2023 CLINICAL DATA:  Bilateral shoulder and neck pain with heaviness in chest. EXAM: CT ANGIOGRAPHY CHEST WITH CONTRAST TECHNIQUE: Multidetector CT imaging of the chest was performed using the standard protocol during bolus administration of intravenous contrast. Multiplanar CT image reconstructions and MIPs were obtained to evaluate the vascular anatomy. RADIATION DOSE REDUCTION: This exam was performed according to the departmental dose-optimization program which includes automated exposure control, adjustment of the mA and/or kV according to patient size and/or use of iterative reconstruction technique. CONTRAST:  75mL OMNIPAQUE IOHEXOL 350 MG/ML SOLN COMPARISON:  Chest x-ray earlier 03/14/2023 FINDINGS: Cardiovascular: Heart is nonenlarged. No pericardial effusion. The thoracic aorta has a normal course and caliber minimal atherosclerotic change. No segmental or larger pulmonary embolism identified. Mediastinum/Nodes: No enlarged mediastinal, hilar, or axillary lymph nodes. Thyroid gland, trachea, and esophagus demonstrate no significant findings. Small hiatal hernia. Lungs/Pleura: There is some mild breathing motion identified. No consolidation, pneumothorax or effusion. Azygous fissure. Upper Abdomen: No acute abnormality.  Fatty liver infiltration. Musculoskeletal: Mild curvature and degenerative changes along the spine. Review of the MIP images confirms the above findings. IMPRESSION: No pulmonary embolism identified. Small hiatal hernia. Aortic Atherosclerosis (ICD10-I70.0). Electronically Signed   By: Karen Kays M.D.   On: 03/14/2023 17:39    DG Chest Portable 1 View  Result Date: 03/14/2023 CLINICAL DATA:  Neck and bilateral shoulder pain with chest heaviness for 2 days. EXAM: PORTABLE CHEST 1 VIEW COMPARISON:  None Available. FINDINGS: The heart size and mediastinal contours are normal. The lungs are clear. There is no pleural effusion or pneumothorax. No acute osseous findings are identified. There are degenerative changes in the thoracic spine. There are calcifications lateral to the left humeral head which could reflect hydroxyapatite deposition. IMPRESSION: No evidence of acute cardiopulmonary process. Possible hydroxyapatite deposition adjacent to the left humeral head. Electronically Signed   By: Carey Bullocks M.D.   On: 03/14/2023 15:30    Procedures .Critical Care  Performed by: Virgina Norfolk, DO Authorized by: Virgina Norfolk, DO   Critical care provider statement:    Critical care time (minutes):  40   Critical care was necessary to treat or prevent imminent or life-threatening deterioration of the following conditions:  Cardiac failure   Critical care  was time spent personally by me on the following activities:  Blood draw for specimens, development of treatment plan with patient or surrogate, discussions with consultants, evaluation of patient's response to treatment, discussions with primary provider, examination of patient, obtaining history from patient or surrogate, ordering and performing treatments and interventions, ordering and review of laboratory studies, ordering and review of radiographic studies, pulse oximetry, re-evaluation of patient's condition and review of old charts     Medications Ordered in ED Medications  heparin bolus via infusion 4,000 Units (has no administration in time range)  heparin ADULT infusion 100 units/mL (25000 units/238mL) (has no administration in time range)  iohexol (OMNIPAQUE) 350 MG/ML injection 75 mL (75 mLs Intravenous Contrast Given 03/14/23 1714)  aspirin chewable  tablet 324 mg (324 mg Oral Given 03/14/23 1843)    ED Course/ Medical Decision Making/ A&P                             Medical Decision Making Amount and/or Complexity of Data Reviewed Labs: ordered. Radiology: ordered.  Risk OTC drugs. Prescription drug management. Decision regarding hospitalization.   Angela Knight is here with chest pain.  Patient mildly hypertensive but otherwise normal vitals.  EKG shows sinus rhythm.  There are some T wave inversions in the lead V4 to V6.  I have no prior to compare to.  She has had some exertional chest pain or shortness of breath the last few days.  No current chest pain now.  She does take estrogen patch.  She does have hypertension, high cholesterol, diabetes.  Differential diagnosis ACS versus PE versus pneumonia versus anemia versus electrolyte abnormality.  Will get CBC, CMP, troponin, chest x-ray, CT scan of the chest to evaluate for PE.  Per my review and interpretation of labs, troponin 57 and 86.  PE scan was performed that showed no blood clot.  There is no other significant anemia or electrolyte abnormality or kidney injury per my review and interpretation of labs.  Chest x-ray showed no evidence of pneumonia per my review and interpretation.  Overall I think she is having an NSTEMI.  Patient was given aspirin and started on heparin bolus and infusion.  Talked with Dr. Anne Fu with cardiology and patient will be admitted to medicine team at [facility] for further cardiac workup.  No current chest pain at this time.  Repeat EKG stable per my review and interpretation.  This chart was dictated using voice recognition software.  Despite best efforts to proofread,  errors can occur which can change the documentation meaning.         Final Clinical Impression(s) / ED Diagnoses Final diagnoses:  NSTEMI (non-ST elevated myocardial infarction) [facility])    Rx / DC Orders ED Discharge Orders     None         Virgina Norfolk,  DO 03/14/23 1926

## 2023-03-15 ENCOUNTER — Encounter (HOSPITAL_COMMUNITY)
Admission: EM | Disposition: A | Discharge status: Home / Self Care | Payer: Self-pay | Source: Home / Self Care | Attending: Internal Medicine

## 2023-03-15 ENCOUNTER — Encounter (HOSPITAL_COMMUNITY): Payer: Self-pay | Admitting: Family Medicine

## 2023-03-15 ENCOUNTER — Inpatient Hospital Stay (HOSPITAL_COMMUNITY): Payer: BC Managed Care – PPO

## 2023-03-15 DIAGNOSIS — I10 Essential (primary) hypertension: Secondary | ICD-10-CM | POA: Diagnosis present

## 2023-03-15 DIAGNOSIS — E119 Type 2 diabetes mellitus without complications: Secondary | ICD-10-CM

## 2023-03-15 DIAGNOSIS — I214 Non-ST elevation (NSTEMI) myocardial infarction: Secondary | ICD-10-CM | POA: Diagnosis not present

## 2023-03-15 DIAGNOSIS — I2510 Atherosclerotic heart disease of native coronary artery without angina pectoris: Secondary | ICD-10-CM | POA: Diagnosis not present

## 2023-03-15 DIAGNOSIS — E785 Hyperlipidemia, unspecified: Secondary | ICD-10-CM | POA: Diagnosis present

## 2023-03-15 HISTORY — PX: LEFT HEART CATH AND CORONARY ANGIOGRAPHY: CATH118249

## 2023-03-15 LAB — ECHOCARDIOGRAM COMPLETE
Area-P 1/2: 2.96 cm2
Height: 66 in
S' Lateral: 2.6 cm
Weight: 3291.2 oz

## 2023-03-15 LAB — GLUCOSE, CAPILLARY
Glucose-Capillary: 203 mg/dL — ABNORMAL HIGH (ref 70–99)
Glucose-Capillary: 164 mg/dL — ABNORMAL HIGH (ref 70–99)
Glucose-Capillary: 137 mg/dL — ABNORMAL HIGH (ref 70–99)
Glucose-Capillary: 104 mg/dL — ABNORMAL HIGH (ref 70–99)
Glucose-Capillary: 105 mg/dL — ABNORMAL HIGH (ref 70–99)
Glucose-Capillary: 162 mg/dL — ABNORMAL HIGH (ref 70–99)

## 2023-03-15 LAB — BASIC METABOLIC PANEL
Anion gap: 10 (ref 5–15)
BUN: 8 mg/dL (ref 6–20)
CO2: 21 mmol/L — ABNORMAL LOW (ref 22–32)
Calcium: 9.5 mg/dL (ref 8.9–10.3)
Chloride: 101 mmol/L (ref 98–111)
Creatinine, Ser: 0.73 mg/dL (ref 0.44–1.00)
GFR, Estimated: >60 mL/min (ref >60)
Glucose, Bld: 186 mg/dL — ABNORMAL HIGH (ref 70–99)
Potassium: 3.7 mmol/L (ref 3.5–5.1)
Sodium: 132 mmol/L — ABNORMAL LOW (ref 135–145)

## 2023-03-15 LAB — HEMOGLOBIN A1C
Hgb A1c MFr Bld: 6.8 % — ABNORMAL HIGH (ref 4.8–5.6)
Mean Plasma Glucose: 148.46 mg/dL

## 2023-03-15 LAB — HIV ANTIBODY (ROUTINE TESTING W REFLEX): HIV Screen 4th Generation wRfx: NONREACTIVE

## 2023-03-15 LAB — TROPONIN I (HIGH SENSITIVITY)
Troponin I (High Sensitivity): 211 ng/L (ref <18)
Troponin I (High Sensitivity): 463 ng/L (ref <18)

## 2023-03-15 LAB — CBC
HCT: 38.9 % (ref 36.0–46.0)
Hemoglobin: 13.7 g/dL (ref 12.0–15.0)
MCH: 30 pg (ref 26.0–34.0)
MCHC: 35.2 g/dL (ref 30.0–36.0)
MCV: 85.1 fL (ref 80.0–100.0)
Platelets: 291 10*3/uL (ref 150–400)
RBC: 4.57 MIL/uL (ref 3.87–5.11)
RDW: 13.4 % (ref 11.5–15.5)
WBC: 8.9 10*3/uL (ref 4.0–10.5)
nRBC: 0 % (ref 0.0–0.2)

## 2023-03-15 LAB — LIPID PANEL
Cholesterol: 170 mg/dL (ref 0–200)
HDL: 43 mg/dL (ref >40)
LDL Cholesterol: 87 mg/dL (ref 0–99)
Total CHOL/HDL Ratio: 4 RATIO
Triglycerides: 200 mg/dL — ABNORMAL HIGH (ref <150)
VLDL: 40 mg/dL (ref 0–40)

## 2023-03-15 LAB — HEPARIN LEVEL (UNFRACTIONATED)
Heparin Unfractionated: <0.1 IU/mL — ABNORMAL LOW (ref 0.30–0.70)
Heparin Unfractionated: 0.13 IU/mL — ABNORMAL LOW (ref 0.30–0.70)

## 2023-03-15 LAB — MAGNESIUM: Magnesium: 2 mg/dL (ref 1.7–2.4)

## 2023-03-15 SURGERY — LEFT HEART CATH AND CORONARY ANGIOGRAPHY
Anesthesia: LOCAL

## 2023-03-15 MED ORDER — SODIUM CHLORIDE 0.9 % IV SOLN
INTRAVENOUS | Status: DC
  Administered 2023-03-17: 250 mL via INTRAVENOUS

## 2023-03-15 MED ORDER — ONDANSETRON HCL 4 MG/2ML IJ SOLN: 4.0000 mg | Freq: Four times a day (QID) | INTRAMUSCULAR | Status: DC | PRN

## 2023-03-15 MED ORDER — HEPARIN (PORCINE) IN NACL 1000-0.9 UT/500ML-% IV SOLN
INTRAVENOUS | Status: DC | PRN
  Administered 2023-03-15 (×2): 500 mL

## 2023-03-15 MED ORDER — ATORVASTATIN CALCIUM 80 MG PO TABS
80.0000 mg | ORAL_TABLET | Freq: Every day | ORAL | Status: DC
  Administered 2023-03-15 – 2023-03-17 (×3): 80 mg via ORAL
  Filled 2023-03-15 (×3): qty 1

## 2023-03-15 MED ORDER — HEPARIN SODIUM (PORCINE) 1000 UNIT/ML IJ SOLN
INTRAMUSCULAR | Status: AC
  Filled 2023-03-15: qty 10

## 2023-03-15 MED ORDER — SODIUM CHLORIDE 0.9 % WEIGHT BASED INFUSION
3.0000 mL/kg/h | INTRAVENOUS | Status: AC
  Administered 2023-03-15: 3 mL/kg/h via INTRAVENOUS

## 2023-03-15 MED ORDER — SODIUM CHLORIDE 0.9% FLUSH: 3.0000 mL | INTRAVENOUS | Status: DC | PRN

## 2023-03-15 MED ORDER — ASPIRIN 81 MG PO CHEW
81.0000 mg | CHEWABLE_TABLET | ORAL | Status: AC
  Administered 2023-03-15: 81 mg via ORAL
  Filled 2023-03-15: qty 1

## 2023-03-15 MED ORDER — OXYCODONE HCL 5 MG PO TABS: 5.0000 mg | ORAL_TABLET | ORAL | Status: DC | PRN

## 2023-03-15 MED ORDER — ACETAMINOPHEN 325 MG PO TABS
650.0000 mg | ORAL_TABLET | ORAL | Status: DC | PRN
  Administered 2023-03-15 – 2023-03-17 (×2): 650 mg via ORAL
  Filled 2023-03-15 (×2): qty 2

## 2023-03-15 MED ORDER — MIDAZOLAM HCL 2 MG/2ML IJ SOLN
INTRAMUSCULAR | Status: DC | PRN
  Administered 2023-03-15: 2 mg via INTRAVENOUS

## 2023-03-15 MED ORDER — LOSARTAN POTASSIUM 25 MG PO TABS
12.5000 mg | ORAL_TABLET | Freq: Every day | ORAL | Status: DC
  Administered 2023-03-15 – 2023-03-18 (×4): 12.5 mg via ORAL
  Filled 2023-03-15 (×4): qty 1

## 2023-03-15 MED ORDER — SODIUM CHLORIDE 0.9 % WEIGHT BASED INFUSION: 1.0000 mL/kg/h | INTRAVENOUS | Status: DC

## 2023-03-15 MED ORDER — LABETALOL HCL 5 MG/ML IV SOLN: 10.0000 mg | INTRAVENOUS | Status: AC | PRN

## 2023-03-15 MED ORDER — VERAPAMIL HCL 2.5 MG/ML IV SOLN
INTRAVENOUS | Status: AC
  Filled 2023-03-15: qty 2

## 2023-03-15 MED ORDER — ACETAMINOPHEN 325 MG PO TABS: 650.0000 mg | ORAL_TABLET | ORAL | Status: DC | PRN

## 2023-03-15 MED ORDER — MIDAZOLAM HCL 2 MG/2ML IJ SOLN
INTRAMUSCULAR | Status: AC
  Filled 2023-03-15: qty 2

## 2023-03-15 MED ORDER — NITROGLYCERIN 0.4 MG SL SUBL: 0.4000 mg | SUBLINGUAL_TABLET | SUBLINGUAL | Status: DC | PRN

## 2023-03-15 MED ORDER — ASPIRIN 81 MG PO CHEW
81.0000 mg | CHEWABLE_TABLET | Freq: Every day | ORAL | Status: DC
  Administered 2023-03-16: 81 mg via ORAL
  Filled 2023-03-15 (×2): qty 1

## 2023-03-15 MED ORDER — INSULIN ASPART 100 UNIT/ML IJ SOLN
0.0000 [IU] | Freq: Three times a day (TID) | INTRAMUSCULAR | Status: DC
  Administered 2023-03-16 – 2023-03-18 (×5): 1 [IU] via SUBCUTANEOUS

## 2023-03-15 MED ORDER — INSULIN ASPART 100 UNIT/ML IJ SOLN
0.0000 [IU] | INTRAMUSCULAR | Status: DC
  Administered 2023-03-15 (×2): 1 [IU] via SUBCUTANEOUS
  Administered 2023-03-15: 2 [IU] via SUBCUTANEOUS

## 2023-03-15 MED ORDER — ATORVASTATIN CALCIUM 80 MG PO TABS
80.0000 mg | ORAL_TABLET | Freq: Every day | ORAL | Status: DC
  Administered 2023-03-15: 80 mg via ORAL
  Filled 2023-03-15: qty 1

## 2023-03-15 MED ORDER — HEPARIN BOLUS VIA INFUSION
2000.0000 [IU] | Freq: Once | INTRAVENOUS | Status: AC
  Administered 2023-03-15: 2000 [IU] via INTRAVENOUS
  Filled 2023-03-15: qty 2000

## 2023-03-15 MED ORDER — SODIUM CHLORIDE 0.9% FLUSH
3.0000 mL | Freq: Two times a day (BID) | INTRAVENOUS | Status: DC
  Administered 2023-03-15: 3 mL via INTRAVENOUS

## 2023-03-15 MED ORDER — VERAPAMIL HCL 2.5 MG/ML IV SOLN
INTRAVENOUS | Status: DC | PRN
  Administered 2023-03-15: 10 mL via INTRA_ARTERIAL

## 2023-03-15 MED ORDER — SODIUM CHLORIDE 0.9% FLUSH
3.0000 mL | Freq: Two times a day (BID) | INTRAVENOUS | Status: DC
  Administered 2023-03-16 – 2023-03-17 (×2): 3 mL via INTRAVENOUS

## 2023-03-15 MED ORDER — LIDOCAINE HCL (PF) 1 % IJ SOLN
INTRAMUSCULAR | Status: DC | PRN
  Administered 2023-03-15: 2 mL

## 2023-03-15 MED ORDER — LIDOCAINE HCL (PF) 1 % IJ SOLN
INTRAMUSCULAR | Status: AC
  Filled 2023-03-15: qty 30

## 2023-03-15 MED ORDER — HYDRALAZINE HCL 20 MG/ML IJ SOLN: 10.0000 mg | INTRAMUSCULAR | Status: AC | PRN

## 2023-03-15 MED ORDER — DIAZEPAM 5 MG PO TABS: 5.0000 mg | ORAL_TABLET | Freq: Four times a day (QID) | ORAL | Status: DC | PRN

## 2023-03-15 MED ORDER — ASPIRIN 81 MG PO TBEC
81.0000 mg | DELAYED_RELEASE_TABLET | Freq: Every day | ORAL | Status: DC
  Administered 2023-03-17 – 2023-03-18 (×2): 81 mg via ORAL
  Filled 2023-03-15 (×2): qty 1

## 2023-03-15 MED ORDER — PANTOPRAZOLE SODIUM 40 MG PO TBEC
40.0000 mg | DELAYED_RELEASE_TABLET | Freq: Every day | ORAL | Status: DC
  Administered 2023-03-15 – 2023-03-18 (×4): 40 mg via ORAL
  Filled 2023-03-15 (×4): qty 1

## 2023-03-15 MED ORDER — HEPARIN SODIUM (PORCINE) 1000 UNIT/ML IJ SOLN
INTRAMUSCULAR | Status: DC | PRN
  Administered 2023-03-15: 4600 [IU] via INTRAVENOUS

## 2023-03-15 MED ORDER — SODIUM CHLORIDE 0.9 % IV SOLN: 250.0000 mL | INTRAVENOUS | Status: DC | PRN

## 2023-03-15 MED ORDER — METOPROLOL TARTRATE 25 MG PO TABS
25.0000 mg | ORAL_TABLET | Freq: Two times a day (BID) | ORAL | Status: DC
  Administered 2023-03-15 – 2023-03-16 (×4): 25 mg via ORAL
  Filled 2023-03-15 (×6): qty 1

## 2023-03-15 MED ORDER — IOHEXOL 350 MG/ML SOLN
INTRAVENOUS | Status: DC | PRN
  Administered 2023-03-15: 85 mL

## 2023-03-15 SURGICAL SUPPLY — 13 items
BAG SNAP BAND KOVER 36X36 (MISCELLANEOUS) IMPLANT
BAND CMPR LRG ZPHR (HEMOSTASIS) ×1
BAND ZEPHYR COMPRESS 30 LONG (HEMOSTASIS) IMPLANT
CATH INFINITI JR4 5F (CATHETERS) IMPLANT
CATH OPTITORQUE TIG 4.0 5F (CATHETERS) IMPLANT
GLIDESHEATH SLEND SS 6F .021 (SHEATH) IMPLANT
GUIDEWIRE INQWIRE 1.5J.035X260 (WIRE) IMPLANT
INQWIRE 1.5J .035X260CM (WIRE) ×1
KIT HEART LEFT (KITS) ×1 IMPLANT
PACK CARDIAC CATHETERIZATION (CUSTOM PROCEDURE TRAY) ×1 IMPLANT
TRANSDUCER W/STOPCOCK (MISCELLANEOUS) ×1 IMPLANT
TUBING CIL FLEX 10 FLL-RA (TUBING) ×1 IMPLANT
WIRE MICROINTRODUCER 60CM (WIRE) IMPLANT

## 2023-03-15 NOTE — H&P (Signed)
History and Physical    Angela Knight:[id number] DOB: Jan 23, 1971 DOA: 03/14/2023  PCP: Delorse Lek, MD   Patient coming from: Home   Chief Complaint: Chest and shoulder discomfort, DOE   HPI: Angela Knight is a pleasant 52 y.o. female with medical history significant for hypertension, hyperlipidemia, and type 2 diabetes mellitus who presents emergency department for evaluation of discomfort in her shoulder and chest, as well as torsional dyspnea.  Patient reports developing an ache involving bilateral shoulders and a heavy sensation in her chest beginning the night of 03/12/2023.  The following day, she became profoundly fatigued and dyspneic while walking down the hall at work.  This was very unusual for her, she felt better after resting for several minutes, but has had recurrence in the symptoms with exertion.  [facility] ED Course: Upon arrival to the ED, patient is found to be afebrile and saturating well on room air with normal heart rate and stable blood pressure.  EKG demonstrates sinus rhythm with inferior T wave inversions.  CTA chest was negative for PE.  Troponin went from 57 to 80 to 94.  Cardiology was consulted by the ED physician, 324 mg of aspirin were administered, IV heparin was started, and patient was transferred to [facility] for admission.  Review of Systems:  All other systems reviewed and apart from HPI, are negative.  Past Medical History:  Diagnosis Date   Car sickness    Complication of anesthesia    severe   Family history of adverse reaction to anesthesia    PONV   GERD (gastroesophageal reflux disease)    occ. OTC med used   History of esophageal stricture    stenosis dilatation 05-02-2016   History of gastric polyp    benign 05-02-2016   PONV (postoperative nausea and vomiting)    SVD (spontaneous vaginal delivery)    x 6   Tubulovillous adenoma of rectum    multiple recurrent's   Wears glasses     Past Surgical  History:  Procedure Laterality Date   ANTERIOR AND POSTERIOR REPAIR N/A 05/08/2017   Procedure: ANTERIOR Colporrhaphy (CYSTOCELE) AND POSTERIOR REPAIR (RECTOCELE), PERINEALORRHAPHY;  Surgeon: Olivia Mackie, MD;  Location: [facility];  Service: Gynecology;  Laterality: N/A;   COLONOSCOPY WITH PROPOFOL N/A 05/02/2016   Procedure: COLONOSCOPY WITH PROPOFOL;  Surgeon: Charolett Bumpers, MD;  Location: [facility];  Service: Endoscopy;  Laterality: N/A;   ESOPHAGOGASTRODUODENOSCOPY (EGD) WITH PROPOFOL N/A 05/02/2016   Procedure: ESOPHAGOGASTRODUODENOSCOPY (EGD) WITH PROPOFOL;  Surgeon: Charolett Bumpers, MD;  Location: [facility];  Service: Endoscopy;  Laterality: N/A;   ROBOTIC ASSISTED TOTAL HYSTERECTOMY WITH SALPINGECTOMY Bilateral 05/08/2017   Procedure: ROBOTIC ASSISTED TOTAL HYSTERECTOMY WITH SALPINGECTOMY And Uterosacral Ligament Suspension;  Surgeon: Olivia Mackie, MD;  Location: [facility];  Service: Gynecology;  Laterality: Bilateral;   SIGMOIDOSCOPY  multiple--   TRANSANAL EXCISION OF RECTAL MASS  06-12-2003 and 05-24-2004   tubulovillious adenoma   TUMOR EXCISION N/A 07/05/2016   Procedure: TRANSANAL EXCISION OF TUBULLOVILLOUS ADENOMA OF RECTUM;  Surgeon: Avel Peace, MD;  Location: [facility];  Service: General;  Laterality: N/A;    Social History:   reports that she has never smoked. She has never used smokeless tobacco. She reports that she does not drink alcohol and does not use drugs.  Allergies  Allergen Reactions   Caffeine Other (See Comments)    Religious dietary    Codeine Nausea And Vomiting   Other  All narcotics causes extreme vomitting   Pseudoephedrine Palpitations   Surgical Lubricant Itching and Rash    Surgical glue    History reviewed. No pertinent family history.   Prior to Admission medications   Medication Sig Start Date End Date Taking? Authorizing Provider  famotidine-calcium carbonate-magnesium hydroxide (PEPCID COMPLETE) [phone]  MG chewable tablet Chew 1 tablet by mouth every evening.     [provider]  HYDROmorphone (DILAUDID) 2 MG tablet Take 0.5-1 tablets (1-2 mg total) by mouth every 6 (six) hours as needed for severe pain. 05/09/17   Olivia Mackie, MD  ibuprofen (ADVIL,MOTRIN) 800 MG tablet Take 1 tablet (800 mg total) by mouth 3 (three) times daily. 05/09/17   Olivia Mackie, MD  ondansetron (ZOFRAN-ODT) 4 MG disintegrating tablet Take 1 tablet (4 mg total) by mouth every 8 (eight) hours as needed for nausea or vomiting. 11/05/21   Teressa Lower, PA-C    Physical Exam: Vitals:   03/14/23 2100 03/14/23 2229 03/15/23 0038 03/15/23 0107  BP: (!) 105/95 128/71 (!) 134/59 (!) 144/81  Pulse: 87 70 64 67  Resp: 20 17 20 20   Temp: 98.4 F (36.9 C) 99.1 F (37.3 C) 98.1 F (36.7 C)   TempSrc: Oral Oral Oral   SpO2: 94% 95% 94% 97%  Weight:  93.3 kg    Height:  5\' 6"  (1.676 m)       Constitutional: NAD, calm  Eyes: PERTLA, lids and conjunctivae normal ENMT: Mucous membranes are moist. Posterior pharynx clear of any exudate or lesions.   Neck: supple, no masses  Respiratory: no wheezing, no crackles. No accessory muscle use.  Cardiovascular: S1 & S2 heard, regular rate and rhythm. No extremity edema.   Abdomen: No distension, no tenderness, soft. Bowel sounds active.  Musculoskeletal: no clubbing / cyanosis. No joint deformity upper and lower extremities.   Skin: no significant rashes, lesions, ulcers. Warm, dry, well-perfused. Neurologic: CN 2-12 grossly intact. Moving all extremities. Alert and oriented.  Psychiatric: Pleasant. Cooperative.    Labs and Imaging on Admission: I have personally reviewed following labs and imaging studies  CBC: Recent Labs  Lab 03/14/23 1511  WBC 9.3  NEUTROABS 4.8  HGB 14.1  HCT 40.5  MCV 86.4  PLT 317   Basic Metabolic Panel: Recent Labs  Lab 03/14/23 1511  NA 137  K 3.8  CL 104  CO2 23  GLUCOSE 109*  BUN 11  CREATININE 0.70  CALCIUM 10.1    GFR: Estimated Creatinine Clearance: 94.7 mL/min (by C-G formula based on SCr of 0.7 mg/dL). Liver Function Tests: Recent Labs  Lab 03/14/23 1511  AST 23  ALT 31  ALKPHOS 44  BILITOT 0.4  PROT 7.2  ALBUMIN 4.9   Recent Labs  Lab 03/14/23 1511  LIPASE <10*   No results for input(s): "AMMONIA" in the last 168 hours. Coagulation Profile: No results for input(s): "INR", "PROTIME" in the last 168 hours. Cardiac Enzymes: No results for input(s): "CKTOTAL", "CKMB", "CKMBINDEX", "TROPONINI" in the last 168 hours. BNP (last 3 results) No results for input(s): "PROBNP" in the last 8760 hours. HbA1C: No results for input(s): "HGBA1C" in the last 72 hours. CBG: No results for input(s): "GLUCAP" in the last 168 hours. Lipid Profile: No results for input(s): "CHOL", "HDL", "LDLCALC", "TRIG", "CHOLHDL", "LDLDIRECT" in the last 72 hours. Thyroid Function Tests: No results for input(s): "TSH", "T4TOTAL", "FREET4", "T3FREE", "THYROIDAB" in the last 72 hours. Anemia Panel: No results for input(s): "VITAMINB12", "FOLATE", "FERRITIN", "TIBC", "IRON", "RETICCTPCT" in  the last 72 hours. Urine analysis:    Component Value Date/Time   COLORURINE YELLOW 11/04/2021 2131   APPEARANCEUR CLEAR 11/04/2021 2131   LABSPEC 1.023 11/04/2021 2131   PHURINE 5.0 11/04/2021 2131   GLUCOSEU >=500 (A) 11/04/2021 2131   HGBUR NEGATIVE 11/04/2021 2131   BILIRUBINUR NEGATIVE 11/04/2021 2131   KETONESUR 5 (A) 11/04/2021 2131   PROTEINUR NEGATIVE 11/04/2021 2131   NITRITE NEGATIVE 11/04/2021 2131   LEUKOCYTESUR NEGATIVE 11/04/2021 2131   Sepsis Labs: @LABRCNTIP (procalcitonin:4,lacticidven:4) )No results found for this or any previous visit (from the past 240 hour(s)).   Radiological Exams on Admission: CT Angio Chest PE W and/or Wo Contrast  Result Date: 03/14/2023 CLINICAL DATA:  Bilateral shoulder and neck pain with heaviness in chest. EXAM: CT ANGIOGRAPHY CHEST WITH CONTRAST TECHNIQUE:  Multidetector CT imaging of the chest was performed using the standard protocol during bolus administration of intravenous contrast. Multiplanar CT image reconstructions and MIPs were obtained to evaluate the vascular anatomy. RADIATION DOSE REDUCTION: This exam was performed according to the departmental dose-optimization program which includes automated exposure control, adjustment of the mA and/or kV according to patient size and/or use of iterative reconstruction technique. CONTRAST:  75mL OMNIPAQUE IOHEXOL 350 MG/ML SOLN COMPARISON:  Chest x-ray earlier 03/14/2023 FINDINGS: Cardiovascular: Heart is nonenlarged. No pericardial effusion. The thoracic aorta has a normal course and caliber minimal atherosclerotic change. No segmental or larger pulmonary embolism identified. Mediastinum/Nodes: No enlarged mediastinal, hilar, or axillary lymph nodes. Thyroid gland, trachea, and esophagus demonstrate no significant findings. Small hiatal hernia. Lungs/Pleura: There is some mild breathing motion identified. No consolidation, pneumothorax or effusion. Azygous fissure. Upper Abdomen: No acute abnormality.  Fatty liver infiltration. Musculoskeletal: Mild curvature and degenerative changes along the spine. Review of the MIP images confirms the above findings. IMPRESSION: No pulmonary embolism identified. Small hiatal hernia. Aortic Atherosclerosis (ICD10-I70.0). Electronically Signed   By: Karen Kays M.D.   On: 03/14/2023 17:39   DG Chest Portable 1 View  Result Date: 03/14/2023 CLINICAL DATA:  Neck and bilateral shoulder pain with chest heaviness for 2 days. EXAM: PORTABLE CHEST 1 VIEW COMPARISON:  None Available. FINDINGS: The heart size and mediastinal contours are normal. The lungs are clear. There is no pleural effusion or pneumothorax. No acute osseous findings are identified. There are degenerative changes in the thoracic spine. There are calcifications lateral to the left humeral head which could reflect  hydroxyapatite deposition. IMPRESSION: No evidence of acute cardiopulmonary process. Possible hydroxyapatite deposition adjacent to the left humeral head. Electronically Signed   By: Carey Bullocks M.D.   On: 03/14/2023 15:30    EKG: Independently reviewed. Sinus rhythm, inferior T-wave inversions.   Assessment/Plan   1. NSTEMI - Appreciate cardiology consultation  - Continue cardiac monitoring, trend troponin, start Lipitor 80 qD, Lopressor 25 BID, ASA 81 qD, continue IV heparin, check echo   2. Type II DM  - A1c was 7.3% in April 2024   - Check CBGs and use low-intensity SSI for now   3. Hypertension  - Starting Lopressor 25 mg BID, continue losartan as tolerated    4. Hyperlipidemia  - Starting Lipitor 80 mg     DVT prophylaxis: IV heparin  Code Status: Full  Level of Care: Level of care: Telemetry Cardiac Family Communication: Husband at bedside  Disposition Plan:  Patient is from: home  Anticipated d/c is to: home  Anticipated d/c date is: 03/18/23  Patient currently: Pending echocardiogram, possible LHC  Consults called: Cardiology  Admission status: Inpatient  Briscoe Deutscher, MD [facility]  03/15/2023, 1:39 AM

## 2023-03-15 NOTE — Progress Notes (Signed)
Pt & family request BSC at bedside, placed w/ toilet paper. On call contacted re: [email or web address] request from pt. IV team consulted @ pt & family request.

## 2023-03-15 NOTE — Progress Notes (Signed)
PROGRESS NOTE Angela Knight  AVW:[id number] DOB: [birth year] DOA: 03/14/2023 PCP: Delorse Lek, MD  Brief Narrative/Hospital Course: 52 y.o. female with medical history significant for hypertension, hyperlipidemia, and type 2 diabetes mellitus presented for evaluation of discomfort in her shoulder and chest, as well as torsional dyspnea. Patient reports developing an ache involving bilateral shoulders and a heavy sensation in her chest beginning the night of 03/12/2023.  The following day, she became profoundly fatigued and dyspneic while walking down the hall at work.  This was very unusual for her, she felt better after resting for several minutes, but has had recurrence in the symptoms with exertion.  Seen at the [facility] ED where: Hemodynamically stable afebrile not hypoxic EKG with inferior T wave inversions CTA chest negative for PE troponin elevated 57 to 80 to 94. Cardiology was consulted suspected to have NSTEMI given aspirin 324 mg placed on heparin and admitted to [facility]    Subjective: Patient seen and examined this morning husband at the bedside mild pain on the back of the neck no chest pain shortness of breath diaphoresis.   Overnight afebrile BP stable, on room air Labs creatinine stable 0.7 stable CBC, HbA1c 6.8  Assessment and Plan: Principal Problem:   NSTEMI (non-ST elevated myocardial infarction) (HCC) Active Problems:   Type 2 diabetes mellitus (HCC)   Hypertension   Hyperlipidemia    NSTEMI:With chest pain, elevated troponin unrelated factors of diabetes hypertension obesity hyperlipidemia. Appreciate cardiology input, S/P aspirin, continue heparin drip, atorvastatin 80 mg, Lopressor follow-up echocardiogram. Echo 5/22- lvef normal no WMA. Per cardio for LHC today.Discussed with cardiology  Type II DM w/ A1c improving7.3%> 6.8.  Continue SSI  Hypertension BP well-controlled started on Lopressor continue losartan  Hyperlipidemia continue high  intensity statin  Mild hyponatremia monitor  Class I Obesity:Patient's Body mass index is 33.2 kg/m. : Will benefit with PCP follow-up, weight loss  healthy lifestyle and outpatient sleep evaluation if not done already.  DVT prophylaxis: heaprin gtt Code Status:   Code Status: Full Code Family Communication: plan of care discussed with patient at bedside. Patient status is:  inpatient  because of NSTEMI Level of care: Telemetry Cardiac   Dispo: The patient is from: HOME            Anticipated disposition: HOME 1-2 DAYS Objective: Vitals last 24 hrs: Vitals:   03/15/23 0345 03/15/23 0355 03/15/23 0802 03/15/23 1040  BP: 130/79  135/67 124/78  Pulse: (!) 56   (!) 59  Resp: 19     Temp: 99.5 F (37.5 C)  98.9 F (37.2 C)   TempSrc: Oral  Oral   SpO2: 96%     Weight:  93.3 kg    Height:       Weight change:   Physical Examination: General exam: alert awake, older than stated age HEENT:Oral mucosa moist, Ear/Nose WNL grossly Respiratory system: bilaterally CLEAR BS, no use of accessory muscle Cardiovascular system: S1 & S2 +, No JVD. Gastrointestinal system: Abdomen soft,NT,ND, BS+ Nervous System:Alert, awake, moving extremities. Extremities: LE edema NEG,distal peripheral pulses palpable.  Skin: No rashes,no icterus. MSK: Normal muscle bulk,tone, power  Medications reviewed:  Scheduled Meds:  aspirin EC  81 mg Oral Daily   atorvastatin  80 mg Oral Daily   insulin aspart  0-6 Units Subcutaneous Q4H   losartan  12.5 mg Oral Daily   metoprolol tartrate  25 mg Oral BID   sodium chloride flush  3 mL Intravenous Q12H  Continuous  Infusions:  sodium chloride     sodium chloride     heparin 1,150 Units/hr (03/15/23 0406)    Diet Order             Diet NPO time specified  Diet effective now                  Intake/Output Summary (Last 24 hours) at 03/15/2023 1135 Last data filed at 03/15/2023 0300 Gross per 24 hour  Intake 591.31 ml  Output --  Net 591.31 ml    Net IO Since Admission: 591.31 mL [03/15/23 1135]  Wt Readings from Last 3 Encounters:  03/15/23 93.3 kg  11/04/21 93 kg  05/09/17 91.2 kg     Unresulted Labs (From admission, onward)     Start     Ordered   03/16/23 0500  Heparin level (unfractionated)  Daily,   R      03/14/23 2236   03/15/23 1200  Heparin level (unfractionated)  Once-Timed,   TIMED       Question:  Specimen collection method  Answer:  Lab=Lab collect   03/15/23 0356   03/15/23 0904  Lipid panel  Once,   R       Comments: Please draw with next heparin labs   Question:  Specimen collection method  Answer:  Lab=Lab collect   03/15/23 0903   03/15/23 0904  Lipoprotein A (LPA)  Once,   R       Comments: Please draw with next heparin labs   Question:  Specimen collection method  Answer:  Lab=Lab collect   03/15/23 0903   03/15/23 0500  CBC  Daily,   R      03/14/23 2236   03/15/23 0500  Lipoprotein A (LPA)  Tomorrow morning,   R        03/15/23 0031   03/15/23 0500  Basic metabolic panel  Daily,   R      03/15/23 0031          Data Reviewed: I have personally reviewed following labs and imaging studies CBC: Recent Labs  Lab 03/14/23 1511 03/15/23 0213  WBC 9.3 8.9  NEUTROABS 4.8  --   HGB 14.1 13.7  HCT 40.5 38.9  MCV 86.4 85.1  PLT 317 291   Basic Metabolic Panel: Recent Labs  Lab 03/14/23 1511 03/15/23 0213  NA 137 132*  K 3.8 3.7  CL 104 101  CO2 23 21*  GLUCOSE 109* 186*  BUN 11 8  CREATININE 0.70 0.73  CALCIUM 10.1 9.5  MG  --  2.0   GFR: Estimated Creatinine Clearance: 94.7 mL/min (by C-G formula based on SCr of 0.73 mg/dL). Liver Function Tests: Recent Labs  Lab 03/14/23 1511  AST 23  ALT 31  ALKPHOS 44  BILITOT 0.4  PROT 7.2  ALBUMIN 4.9   Recent Labs  Lab 03/14/23 1511  LIPASE <10*   HbA1C: Recent Labs    03/15/23 0217  HGBA1C 6.8*   CBG: Recent Labs  Lab 03/15/23 0158 03/15/23 0342 03/15/23 0805  GLUCAP 203* 164* 137*  No results found for this  or any previous visit (from the past 240 hour(s)).  Antimicrobials: Anti-infectives (From admission, onward)    None      Culture/Microbiology No results found for: "SDES", "SPECREQUEST", "CULT", "REPTSTATUS"  Radiology Studies: ECHOCARDIOGRAM COMPLETE  Result Date: 03/15/2023    ECHOCARDIOGRAM REPORT   Patient Name:   Angela Knight Date of Exam: 03/15/2023 Medical Rec #:  [id number]  Height:       66.0 in Accession #:    [id number]    Weight:       205.7 lb Date of Birth:  May 27, 1971     BSA:          2.024 m Patient Age:    51 years      BP:           130/79 mmHg Patient Gender: F             HR:           59 bpm. Exam Location:  Inpatient Procedure: 2D Echo, Color Doppler and Cardiac Doppler Indications:    NSTEMI  History:        Patient has no prior history of Echocardiogram examinations.                 Risk Factors:Hypertension, Diabetes and Dyslipidemia.  Sonographer:    Irving Burton Senior RDCS Referring Phys: [id number] TIMOTHY S OPYD IMPRESSIONS  1. Left ventricular ejection fraction, by estimation, is 65 to 70%. The left ventricle has normal function. The left ventricle has no regional wall motion abnormalities. There is mild concentric left ventricular hypertrophy. Left ventricular diastolic parameters were normal.  2. Right ventricular systolic function is normal. The right ventricular size is mildly enlarged. Tricuspid regurgitation signal is inadequate for assessing PA pressure.  3. The mitral valve is normal in structure. Trivial mitral valve regurgitation.  4. The aortic valve is tricuspid. Aortic valve regurgitation is not visualized.  5. The inferior vena cava is normal in size with greater than 50% respiratory variability, suggesting right atrial pressure of 3 mmHg. FINDINGS  Left Ventricle: Left ventricular ejection fraction, by estimation, is 65 to 70%. The left ventricle has normal function. The left ventricle has no regional wall motion abnormalities. The left ventricular internal cavity  size was normal in size. There is  mild concentric left ventricular hypertrophy. Left ventricular diastolic parameters were normal. Right Ventricle: The right ventricular size is mildly enlarged. No increase in right ventricular wall thickness. Right ventricular systolic function is normal. Tricuspid regurgitation signal is inadequate for assessing PA pressure. Left Atrium: Left atrial size was normal in size. Right Atrium: Right atrial size was normal in size. Pericardium: Trivial pericardial effusion is present. Mitral Valve: The mitral valve is normal in structure. Trivial mitral valve regurgitation. Tricuspid Valve: The tricuspid valve is normal in structure. Tricuspid valve regurgitation is trivial. Aortic Valve: The aortic valve is tricuspid. Aortic valve regurgitation is not visualized. Pulmonic Valve: The pulmonic valve was normal in structure. Pulmonic valve regurgitation is trivial. Aorta: The aortic root and ascending aorta are structurally normal, with no evidence of dilitation. Venous: The inferior vena cava is normal in size with greater than 50% respiratory variability, suggesting right atrial pressure of 3 mmHg. IAS/Shunts: No atrial level shunt detected by color flow Doppler.  LEFT VENTRICLE PLAX 2D LVIDd:         4.80 cm   Diastology LVIDs:         2.60 cm   LV e' medial:    9.95 cm/s LV PW:         1.20 cm   LV E/e' medial:  8.0 LV IVS:        1.00 cm   LV e' lateral:   14.00 cm/s LVOT diam:     1.70 cm   LV E/e' lateral: 5.7 LV SV:         47 LV SV  Index:   23 LVOT Area:     2.27 cm  RIGHT VENTRICLE RV S prime:     8.39 cm/s TAPSE (M-mode): 2.2 cm LEFT ATRIUM             Index        RIGHT ATRIUM           Index LA diam:        2.20 cm 1.09 cm/m   RA Area:     19.30 cm LA Vol (A2C):   36.9 ml 18.23 ml/m  RA Volume:   54.70 ml  27.03 ml/m LA Vol (A4C):   35.2 ml 17.39 ml/m LA Biplane Vol: 39.2 ml 19.37 ml/m  AORTIC VALVE LVOT Vmax:   99.40 cm/s LVOT Vmean:  69.400 cm/s LVOT VTI:    0.205 m   AORTA Ao Root diam: 3.10 cm Ao Asc diam:  2.80 cm MITRAL VALVE MV Area (PHT): 2.96 cm    SHUNTS MV Decel Time: 256 msec    Systemic VTI:  0.20 m MV E velocity: 79.20 cm/s  Systemic Diam: 1.70 cm MV A velocity: 69.70 cm/s MV E/A ratio:  1.14 Donato Schultz MD Electronically signed by Donato Schultz MD Signature Date/Time: 03/15/2023/11:04:05 AM    Final    CT Angio Chest PE W and/or Wo Contrast  Result Date: 03/14/2023 CLINICAL DATA:  Bilateral shoulder and neck pain with heaviness in chest. EXAM: CT ANGIOGRAPHY CHEST WITH CONTRAST TECHNIQUE: Multidetector CT imaging of the chest was performed using the standard protocol during bolus administration of intravenous contrast. Multiplanar CT image reconstructions and MIPs were obtained to evaluate the vascular anatomy. RADIATION DOSE REDUCTION: This exam was performed according to the departmental dose-optimization program which includes automated exposure control, adjustment of the mA and/or kV according to patient size and/or use of iterative reconstruction technique. CONTRAST:  75mL OMNIPAQUE IOHEXOL 350 MG/ML SOLN COMPARISON:  Chest x-ray earlier 03/14/2023 FINDINGS: Cardiovascular: Heart is nonenlarged. No pericardial effusion. The thoracic aorta has a normal course and caliber minimal atherosclerotic change. No segmental or larger pulmonary embolism identified. Mediastinum/Nodes: No enlarged mediastinal, hilar, or axillary lymph nodes. Thyroid gland, trachea, and esophagus demonstrate no significant findings. Small hiatal hernia. Lungs/Pleura: There is some mild breathing motion identified. No consolidation, pneumothorax or effusion. Azygous fissure. Upper Abdomen: No acute abnormality.  Fatty liver infiltration. Musculoskeletal: Mild curvature and degenerative changes along the spine. Review of the MIP images confirms the above findings. IMPRESSION: No pulmonary embolism identified. Small hiatal hernia. Aortic Atherosclerosis (ICD10-I70.0). Electronically Signed    By: Karen Kays M.D.   On: 03/14/2023 17:39   DG Chest Portable 1 View  Result Date: 03/14/2023 CLINICAL DATA:  Neck and bilateral shoulder pain with chest heaviness for 2 days. EXAM: PORTABLE CHEST 1 VIEW COMPARISON:  None Available. FINDINGS: The heart size and mediastinal contours are normal. The lungs are clear. There is no pleural effusion or pneumothorax. No acute osseous findings are identified. There are degenerative changes in the thoracic spine. There are calcifications lateral to the left humeral head which could reflect hydroxyapatite deposition. IMPRESSION: No evidence of acute cardiopulmonary process. Possible hydroxyapatite deposition adjacent to the left humeral head. Electronically Signed   By: Carey Bullocks M.D.   On: 03/14/2023 15:30     LOS: 1 day   Lanae Boast, MD [facility]  03/15/2023, 11:35 AM

## 2023-03-15 NOTE — Progress Notes (Signed)
ANTICOAGULATION CONSULT NOTE   Pharmacy Consult for heparin Indication: chest pain/ACS  Allergies  Allergen Reactions   Caffeine Other (See Comments)    Religious dietary    Codeine Nausea And Vomiting   Other     All narcotics causes extreme vomitting   Pseudoephedrine Palpitations   Surgical Lubricant Itching and Rash    Surgical glue    Patient Measurements: Height: 5\' 6"  (167.6 cm) Weight: 93.3 kg (205 lb 11.2 oz) IBW/kg (Calculated) : 59.3 Heparin Dosing Weight: 79.1 kg  Vital Signs: Temp: 99.3 F (37.4 C) (05/22 1315) Temp Source: Oral (05/22 1315) BP: 122/65 (05/22 1315) Pulse Rate: 59 (05/22 1040)  Labs: Recent Labs    03/14/23 1511 03/14/23 1728 03/14/23 2236 03/15/23 0213 03/15/23 0217 03/15/23 0218 03/15/23 0649 03/15/23 1208  HGB 14.1  --   --  13.7  --   --   --   --   HCT 40.5  --   --  38.9  --   --   --   --   PLT 317  --   --  291  --   --   --   --   HEPARINUNFRC  --   --   --   --  <0.10*  --   --  0.13*  CREATININE 0.70  --   --  0.73  --   --   --   --   TROPONINIHS 57*   < > 122*  --   --  211* 463*  --    < > = values in this interval not displayed.     Estimated Creatinine Clearance: 94.7 mL/min (by C-G formula based on SCr of 0.73 mg/dL).   Medical History: Past Medical History:  Diagnosis Date   Car sickness    Complication of anesthesia    severe   Family history of adverse reaction to anesthesia    PONV   GERD (gastroesophageal reflux disease)    occ. OTC med used   History of esophageal stricture    stenosis dilatation 05-02-2016   History of gastric polyp    benign 05-02-2016   PONV (postoperative nausea and vomiting)    SVD (spontaneous vaginal delivery)    x 6   Tubulovillous adenoma of rectum    multiple recurrent's   Wears glasses     Medications:  Medications Prior to Admission  Medication Sig Dispense Refill Last Dose   estradiol (VIVELLE-DOT) 0.0375 MG/24HR Place 1 patch onto the skin 2 (two) times a  week.      famotidine-calcium carbonate-magnesium hydroxide (PEPCID COMPLETE) 10-800-165 MG chewable tablet Chew 1 tablet by mouth every evening.       glipiZIDE (GLUCOTROL XL) 5 MG 24 hr tablet Take 5 mg by mouth daily.      HYDROmorphone (DILAUDID) 2 MG tablet Take 0.5-1 tablets (1-2 mg total) by mouth every 6 (six) hours as needed for severe pain. 30 tablet 0    ibuprofen (ADVIL,MOTRIN) 800 MG tablet Take 1 tablet (800 mg total) by mouth 3 (three) times daily. 50 tablet 0    losartan (COZAAR) 25 MG tablet Take 0.5 tablets by mouth daily.      metFORMIN (GLUCOPHAGE-XR) 500 MG 24 hr tablet Take 500 mg by mouth 2 (two) times daily.      ondansetron (ZOFRAN-ODT) 4 MG disintegrating tablet Take 1 tablet (4 mg total) by mouth every 8 (eight) hours as needed for nausea or vomiting. 20 tablet 0  OZEMPIC, 0.25 OR 0.5 MG/DOSE, 2 MG/3ML SOPN Inject 0.25 mg into the skin once a week.      rosuvastatin (CRESTOR) 5 MG tablet Take 5 mg by mouth at bedtime.      Vibegron 75 MG TABS Take 1 tablet by mouth daily.      Scheduled:   aspirin EC  81 mg Oral Daily   atorvastatin  80 mg Oral Daily   insulin aspart  0-6 Units Subcutaneous Q4H   losartan  12.5 mg Oral Daily   metoprolol tartrate  25 mg Oral BID   sodium chloride flush  3 mL Intravenous Q12H   Infusions:   sodium chloride     sodium chloride 1 mL/kg/hr (03/15/23 1203)   heparin 1,150 Units/hr (03/15/23 0406)    Assessment: Patient presented with bilateral shoulder/neck pain, chest heaviness, and SOB on exertion. Not on anticoagulation PTA. No significant cardiac history noted. Pharmacy dosing heparin. Plans noted for cath today -heparin level= 0.13 on 1150 units/hr   Goal of Therapy:  Heparin level 0.3-0.7 units/ml Monitor platelets by anticoagulation protocol: Yes   Plan:  -No heparin bolus since cath later today -Increase heparin to 1400 units/hr -Will follow plans post cath  Harland German, PharmD Clinical  Pharmacist **Pharmacist phone directory can now be found on [email or web address] ([email or web address]).  Listed under [facility].

## 2023-03-15 NOTE — Plan of Care (Signed)
  Problem: Clinical Measurements: Goal: Ability to maintain clinical measurements within normal limits will improve Outcome: Progressing Goal: Cardiovascular complication will be avoided Outcome: Progressing   Problem: Health Behavior/Discharge Planning: Goal: Ability to manage health-related needs will improve Outcome: Progressing   Problem: Metabolic: Goal: Ability to maintain appropriate glucose levels will improve Outcome: Progressing   Problem: Health Behavior/Discharge Planning: Goal: Ability to safely manage health-related needs after discharge will improve Outcome: Progressing

## 2023-03-15 NOTE — Hospital Course (Addendum)
52 y.o. female with medical history significant for hypertension, hyperlipidemia, and type 2 diabetes mellitus presented for evaluation of discomfort in her shoulder and chest, as well as torsional dyspnea. Patient reports developing an ache involving bilateral shoulders and a heavy sensation in her chest beginning the night of 03/12/2023.  The following day, she became profoundly fatigued and dyspneic while walking down the hall at work.  This was very unusual for her, she felt better after resting for several minutes, but has had recurrence in the symptoms with exertion. [facility] where: Hemodynamically stable afebrile not hypoxic EKG with inferior T wave inversions CTA chest negative for PE troponin elevated 57 to 80 to 94. Cardiology was consulted suspected to have NSTEMI given aspirin 324 mg placed on heparin and admitted to [facility] with risk factors of DM and unstable anginal symptoms with exertion PTA/ S/P LHC 5/22: Prox-mid LAD disease and D1 bifurcation; mid RCA disease.

## 2023-03-15 NOTE — Progress Notes (Signed)
Lab called with critical troponin of 463. Duke, PA paged and made aware and called back to say MD on the way up and possibly cath pt this afternoon. Will continue to monitor. No new orders at this time.

## 2023-03-15 NOTE — Progress Notes (Signed)
Echocardiogram 2D Echocardiogram has been performed.  Warren Lacy Alyla Pietila RDCS 03/15/2023, 10:46 AM

## 2023-03-15 NOTE — TOC CM/SW Note (Signed)
Transition of Care [facility]) - Inpatient Brief Assessment   Patient Details  Name: Angela Knight MRN: [id number] Date of Birth: 05/20/1971  Transition of Care [facility]) CM/SW Contact:    Gala Lewandowsky, RN Phone Number: 03/15/2023, 12:30 PM   Clinical Narrative: Patient presented for chest and shoulder pain-plan for LHC today. Patients spouse and sister were at the bedside during the visit. PTA patient was independent from home. Case Manager will continue to follow for transition of care needs as the patient progresses.    Transition of Care Asessment: Insurance and Status: Insurance coverage has been reviewed Patient has primary care physician: Yes Home environment has been reviewed: yes Prior level of function:: reviewed-ambulatory without any functional limitations Prior/Current Home Services: No current home services Social Determinants of Health Reivew: SDOH reviewed no interventions necessary Readmission risk has been reviewed: Yes Transition of care needs: no transition of care needs at this time

## 2023-03-15 NOTE — Progress Notes (Signed)
ANTICOAGULATION CONSULT NOTE   Pharmacy Consult for heparin Indication: chest pain/ACS  Allergies  Allergen Reactions   Caffeine Other (See Comments)    Religious dietary    Codeine Nausea And Vomiting   Other     All narcotics causes extreme vomitting   Pseudoephedrine Palpitations   Surgical Lubricant Itching and Rash    Surgical glue    Patient Measurements: Height: 5\' 6"  (167.6 cm) Weight: 93.3 kg (205 lb 11.2 oz) IBW/kg (Calculated) : 59.3 Heparin Dosing Weight: 79.1 kg  Vital Signs: Temp: 99.5 F (37.5 C) (05/22 0345) Temp Source: Oral (05/22 0345) BP: 130/79 (05/22 0345) Pulse Rate: 56 (05/22 0345)  Labs: Recent Labs    03/14/23 1511 03/14/23 1728 03/14/23 1929 03/14/23 2236 03/15/23 0213 03/15/23 0217 03/15/23 0218  HGB 14.1  --   --   --  13.7  --   --   HCT 40.5  --   --   --  38.9  --   --   PLT 317  --   --   --  291  --   --   HEPARINUNFRC  --   --   --   --   --  <0.10*  --   CREATININE 0.70  --   --   --  0.73  --   --   TROPONINIHS 57*   < > 94* 122*  --   --  211*   < > = values in this interval not displayed.     Estimated Creatinine Clearance: 94.7 mL/min (by C-G formula based on SCr of 0.73 mg/dL).   Medical History: Past Medical History:  Diagnosis Date   Car sickness    Complication of anesthesia    severe   Family history of adverse reaction to anesthesia    PONV   GERD (gastroesophageal reflux disease)    occ. OTC med used   History of esophageal stricture    stenosis dilatation 05-02-2016   History of gastric polyp    benign 05-02-2016   PONV (postoperative nausea and vomiting)    SVD (spontaneous vaginal delivery)    x 6   Tubulovillous adenoma of rectum    multiple recurrent's   Wears glasses     Medications:  Medications Prior to Admission  Medication Sig Dispense Refill Last Dose   famotidine-calcium carbonate-magnesium hydroxide (PEPCID COMPLETE) 10-800-165 MG chewable tablet Chew 1 tablet by mouth every  evening.       HYDROmorphone (DILAUDID) 2 MG tablet Take 0.5-1 tablets (1-2 mg total) by mouth every 6 (six) hours as needed for severe pain. 30 tablet 0    ibuprofen (ADVIL,MOTRIN) 800 MG tablet Take 1 tablet (800 mg total) by mouth 3 (three) times daily. 50 tablet 0    ondansetron (ZOFRAN-ODT) 4 MG disintegrating tablet Take 1 tablet (4 mg total) by mouth every 8 (eight) hours as needed for nausea or vomiting. 20 tablet 0    Scheduled:   aspirin EC  81 mg Oral Daily   atorvastatin  80 mg Oral Daily   insulin aspart  0-6 Units Subcutaneous Q4H   losartan  12.5 mg Oral Daily   metoprolol tartrate  25 mg Oral BID   Infusions:   heparin 1,000 Units/hr (03/14/23 1945)    Assessment: Patient presented with bilateral shoulder/neck pain, chest heaviness, and SOB on exertion. Not on anticoagulation PTA. No significant cardiac history noted. HS-troponin 57>80. CBC WNL.  5/22 AM update:  Heparin level sub-therapeutic   Goal  of Therapy:  Heparin level 0.3-0.7 units/ml Monitor platelets by anticoagulation protocol: Yes   Plan:  Give heparin 2000 unit bolus Inc heparin to 1150 units/hr 1200 heparin level  Abran Duke, PharmD, BCPS Clinical Pharmacist Phone: [phone]

## 2023-03-15 NOTE — Progress Notes (Signed)
Progress Note  Patient Name: Angela Knight Date of Encounter: 03/15/2023  Primary Cardiologist:New to Dr. Izora Ribas  Subjective   Overnight no further pain.  Pain worsening with exertion.  No nitro No SOB, Palpitations. While in the room, troponin increased to 463.  Inpatient Medications    Scheduled Meds:  aspirin EC  81 mg Oral Daily   atorvastatin  80 mg Oral Daily   insulin aspart  0-6 Units Subcutaneous Q4H   losartan  12.5 mg Oral Daily   metoprolol tartrate  25 mg Oral BID   Continuous Infusions:  heparin 1,150 Units/hr (03/15/23 0406)   PRN Meds: acetaminophen, nitroGLYCERIN, ondansetron (ZOFRAN) IV, oxyCODONE   Vital Signs    Vitals:   03/15/23 0107 03/15/23 0345 03/15/23 0355 03/15/23 0802  BP: (!) 144/81 130/79  135/67  Pulse: 67 (!) 56    Resp: 20 19    Temp:  99.5 F (37.5 C)  98.9 F (37.2 C)  TempSrc:  Oral  Oral  SpO2: 97% 96%    Weight:   93.3 kg   Height:        Intake/Output Summary (Last 24 hours) at 03/15/2023 0859 Last data filed at 03/15/2023 0300 Gross per 24 hour  Intake 591.31 ml  Output --  Net 591.31 ml   Filed Weights   03/14/23 1516 03/14/23 2229 03/15/23 0355  Weight: 90.7 kg 93.3 kg 93.3 kg    Telemetry    SR to sinus bradycardia - Personally Reviewed  ECG    Sinus bradycardia with inferior TWI - Personally Reviewed  Physical Exam   Gen: no distress   Neck: No JVD Cardiac: No Rubs or Gallops, no murmur, regula rhythm+2 radial pulses Respiratory: Clear to auscultation bilaterally, normal effort, normal  respiratory rate GI: Soft, nontender, non-distended  MS: No  edema;  moves all extremities Integument: Skin feels warm Neuro:  At time of evaluation, alert and oriented to person/place/time/situation  Psych: Normal affect, patient feels well   Labs    Chemistry Recent Labs  Lab 03/14/23 1511 03/15/23 0213  NA 137 132*  K 3.8 3.7  CL 104 101  CO2 23 21*  GLUCOSE 109* 186*  BUN 11 8   CREATININE 0.70 0.73  CALCIUM 10.1 9.5  PROT 7.2  --   ALBUMIN 4.9  --   AST 23  --   ALT 31  --   ALKPHOS 44  --   BILITOT 0.4  --   GFRNONAA >60 >60  ANIONGAP 10 10     Hematology Recent Labs  Lab 03/14/23 1511 03/15/23 0213  WBC 9.3 8.9  RBC 4.69 4.57  HGB 14.1 13.7  HCT 40.5 38.9  MCV 86.4 85.1  MCH 30.1 30.0  MCHC 34.8 35.2  RDW 13.6 13.4  PLT 317 291    Cardiac EnzymesNo results for input(s): "TROPONINI" in the last 168 hours. No results for input(s): "TROPIPOC" in the last 168 hours.   BNPNo results for input(s): "BNP", "PROBNP" in the last 168 hours.   DDimer No results for input(s): "DDIMER" in the last 168 hours.   Radiology    CT Angio Chest PE W and/or Wo Contrast  Result Date: 03/14/2023 CLINICAL DATA:  Bilateral shoulder and neck pain with heaviness in chest. EXAM: CT ANGIOGRAPHY CHEST WITH CONTRAST TECHNIQUE: Multidetector CT imaging of the chest was performed using the standard protocol during bolus administration of intravenous contrast. Multiplanar CT image reconstructions and MIPs were obtained to evaluate the vascular anatomy.  RADIATION DOSE REDUCTION: This exam was performed according to the departmental dose-optimization program which includes automated exposure control, adjustment of the mA and/or kV according to patient size and/or use of iterative reconstruction technique. CONTRAST:  75mL OMNIPAQUE IOHEXOL 350 MG/ML SOLN COMPARISON:  Chest x-ray earlier 03/14/2023 FINDINGS: Cardiovascular: Heart is nonenlarged. No pericardial effusion. The thoracic aorta has a normal course and caliber minimal atherosclerotic change. No segmental or larger pulmonary embolism identified. Mediastinum/Nodes: No enlarged mediastinal, hilar, or axillary lymph nodes. Thyroid gland, trachea, and esophagus demonstrate no significant findings. Small hiatal hernia. Lungs/Pleura: There is some mild breathing motion identified. No consolidation, pneumothorax or effusion. Azygous  fissure. Upper Abdomen: No acute abnormality.  Fatty liver infiltration. Musculoskeletal: Mild curvature and degenerative changes along the spine. Review of the MIP images confirms the above findings. IMPRESSION: No pulmonary embolism identified. Small hiatal hernia. Aortic Atherosclerosis (ICD10-I70.0). Electronically Signed   By: Karen Kays M.D.   On: 03/14/2023 17:39   DG Chest Portable 1 View  Result Date: 03/14/2023 CLINICAL DATA:  Neck and bilateral shoulder pain with chest heaviness for 2 days. EXAM: PORTABLE CHEST 1 VIEW COMPARISON:  None Available. FINDINGS: The heart size and mediastinal contours are normal. The lungs are clear. There is no pleural effusion or pneumothorax. No acute osseous findings are identified. There are degenerative changes in the thoracic spine. There are calcifications lateral to the left humeral head which could reflect hydroxyapatite deposition. IMPRESSION: No evidence of acute cardiopulmonary process. Possible hydroxyapatite deposition adjacent to the left humeral head. Electronically Signed   By: Carey Bullocks M.D.   On: 03/14/2023 15:30     Patient Profile     52 y.o. female with NSTEMI  Assessment & Plan     NSTEMI - with risk factors of DM and unstable anginal symptoms with exertion PTA - anatomy: no calcified plaque seen on CTPE - continue ASA 81 - continue statin, goal LDL < 55 - sending Lp(a)  and lipids - will get echo; has low resting heart rates at baseline; do not plan on discharge BB if preserved EF (based on REDUCE-AMI data) and relatively low resting heart rate - no nitrates needed - continue ARB (for renal) - will plan for  cardiac rehab  Risks and benefits of cardiac catheterization have been discussed with the patient.  These include bleeding, infection, kidney damage, stroke, heart attack, death.  The patient understands these risks and is willing to proceed.  Access recommendations: R radial Procedural considerations: No not  suspect 3VD, would be could CABG candidate if this was found       For questions or updates, please contact [facility] Please consult [email or web address] for contact info under Cardiology/STEMI.      Riley Lam, MD FASE [facility] Cardiologist [facility]  [address] [place], [address] [place], [place] [address] [phone]  8:59 AM

## 2023-03-16 ENCOUNTER — Encounter (HOSPITAL_COMMUNITY): Payer: Self-pay | Admitting: Cardiovascular Disease

## 2023-03-16 ENCOUNTER — Other Ambulatory Visit (HOSPITAL_COMMUNITY): Payer: BC Managed Care – PPO

## 2023-03-16 DIAGNOSIS — I214 Non-ST elevation (NSTEMI) myocardial infarction: Secondary | ICD-10-CM | POA: Diagnosis not present

## 2023-03-16 LAB — BASIC METABOLIC PANEL
Anion gap: 8 (ref 5–15)
BUN: 10 mg/dL (ref 6–20)
CO2: 22 mmol/L (ref 22–32)
Calcium: 9.7 mg/dL (ref 8.9–10.3)
Chloride: 105 mmol/L (ref 98–111)
Creatinine, Ser: 0.82 mg/dL (ref 0.44–1.00)
GFR, Estimated: >60 mL/min (ref >60)
Glucose, Bld: 136 mg/dL — ABNORMAL HIGH (ref 70–99)
Potassium: 4.4 mmol/L (ref 3.5–5.1)
Sodium: 135 mmol/L (ref 135–145)

## 2023-03-16 LAB — CBC
HCT: 40.5 % (ref 36.0–46.0)
Hemoglobin: 14 g/dL (ref 12.0–15.0)
MCH: 29.7 pg (ref 26.0–34.0)
MCHC: 34.6 g/dL (ref 30.0–36.0)
MCV: 85.8 fL (ref 80.0–100.0)
Platelets: 290 10*3/uL (ref 150–400)
RBC: 4.72 MIL/uL (ref 3.87–5.11)
RDW: 13.3 % (ref 11.5–15.5)
WBC: 7.3 10*3/uL (ref 4.0–10.5)
nRBC: 0 % (ref 0.0–0.2)

## 2023-03-16 LAB — LIPOPROTEIN A (LPA)
Lipoprotein (a): 56.3 nmol/L — ABNORMAL HIGH (ref <75.0)
Lipoprotein (a): 13.6 nmol/L (ref <75.0)

## 2023-03-16 LAB — HEPARIN LEVEL (UNFRACTIONATED)
Heparin Unfractionated: <0.1 IU/mL — ABNORMAL LOW (ref 0.30–0.70)
Heparin Unfractionated: 0.11 IU/mL — ABNORMAL LOW (ref 0.30–0.70)
Heparin Unfractionated: 0.29 IU/mL — ABNORMAL LOW (ref 0.30–0.70)

## 2023-03-16 LAB — GLUCOSE, CAPILLARY
Glucose-Capillary: 179 mg/dL — ABNORMAL HIGH (ref 70–99)
Glucose-Capillary: 108 mg/dL — ABNORMAL HIGH (ref 70–99)
Glucose-Capillary: 162 mg/dL — ABNORMAL HIGH (ref 70–99)
Glucose-Capillary: 164 mg/dL — ABNORMAL HIGH (ref 70–99)

## 2023-03-16 MED ORDER — ORAL CARE MOUTH RINSE: 15.0000 mL | OROMUCOSAL | Status: DC | PRN

## 2023-03-16 MED ORDER — HEPARIN (PORCINE) 25000 UT/250ML-% IV SOLN
1650.0000 [IU]/h | INTRAVENOUS | Status: DC
  Administered 2023-03-16: 1550 [IU]/h via INTRAVENOUS
  Administered 2023-03-16: 1300 [IU]/h via INTRAVENOUS
  Filled 2023-03-16 (×2): qty 250

## 2023-03-16 MED ORDER — POLYETHYLENE GLYCOL 3350 17 G PO PACK: 34.0000 g | PACK | Freq: Once | ORAL | Status: DC

## 2023-03-16 NOTE — Progress Notes (Signed)
PROGRESS NOTE Angela Knight  ZOX:[id number] DOB: 09-Jun-1971 DOA: 03/14/2023 PCP: Delorse Lek, MD  Brief Narrative/Hospital Course: 52 y.o. female with medical history significant for hypertension, hyperlipidemia, and type 2 diabetes mellitus presented for evaluation of discomfort in her shoulder and chest, as well as torsional dyspnea. Patient reports developing an ache involving bilateral shoulders and a heavy sensation in her chest beginning the night of 03/12/2023.  The following day, she became profoundly fatigued and dyspneic while walking down the hall at work.  This was very unusual for her, she felt better after resting for several minutes, but has had recurrence in the symptoms with exertion. [facility] where: Hemodynamically stable afebrile not hypoxic EKG with inferior T wave inversions CTA chest negative for PE troponin elevated 57 to 80 to 94. Cardiology was consulted suspected to have NSTEMI given aspirin 324 mg placed on heparin and admitted to [facility] with risk factors of DM and unstable anginal symptoms with exertion PTA/ S/P LHC 5/22: Prox-mid LAD disease and D1 bifurcation; mid RCA disease.     Subjective:  Seen and examined C/o some arm pain Tearful this am Husband at the bedside  Assessment and Plan: Principal Problem:   NSTEMI (non-ST elevated myocardial infarction) [facility]) Active Problems:   Type 2 diabetes mellitus (HCC)   Hypertension   Hyperlipidemia    NSTEMI:With chest pain, elevated troponin  w/ risk factors of diabetes hypertension obesity hyperlipidemia,Echo 5/22- lvef normal no WMA. S/P LHC 5/22: Prox-mid LAD disease and D1 bifurcation; mid RCA disease. CT Sx consulted. Cont asa, lipitor, losartan  Type II DM w/ A1c improving7.3%> 6.8.stable,continue SSI Recent Labs  Lab 03/15/23 0217 03/15/23 0342 03/15/23 0805 03/15/23 1202 03/15/23 1803 03/15/23 2126 03/16/23 0851  GLUCAP  --    < > 137* 104* 105* 162* 179*  HGBA1C  6.8*  --   --   --   --   --   --    < > = values in this interval not displayed.    Hypertension BP stable on losartan.  Hyperlipidemia continue high intensity statin  Mild hyponatremia monitor  Class I Obesity:Patient's Body mass index is 32.83 kg/m. : Will benefit with PCP follow-up, weight loss  healthy lifestyle and outpatient sleep evaluation if not done already.  DVT prophylaxis: SCD's Start: 03/15/23 1754heaprin gtt Code Status:   Code Status: Full Code Family Communication: plan of care discussed with patient at bedside. Patient status is:  inpatient  because of NSTEMI Level of care: Telemetry Cardiac   Dispo: The patient is from: HOME            Anticipated disposition: TBD Objective: Vitals last 24 hrs: Vitals:   03/15/23 1715 03/15/23 1731 03/15/23 2100 03/16/23 0500  BP: (!) 153/83  137/69 125/65  Pulse: 65 68 63 (!) 55  Resp: 19 18 15 17   Temp:   98 F (36.7 C) 97.9 F (36.6 C)  TempSrc:   Oral Oral  SpO2: 100% 99% 99% 98%  Weight:    92.3 kg  Height:       Weight change: 1.542 kg  Physical Examination: General exam: AA, weak,older appearing HEENT:Oral mucosa moist, Ear/Nose WNL grossly, dentition normal. Respiratory system: bilaterally cear BS, no use of accessory muscle Cardiovascular system: S1 & S2 +, regular rate. Gastrointestinal system: Abdomen soft, NT,ND,BS+ Nervous System:Alert, awake, moving extremities and grossly nonfocal Extremities: LE ankle edema neg, lower extremities warm Skin: No rashes,no icterus. MSK: Normal muscle bulk,tone,  power   Medications reviewed:  Scheduled Meds:  aspirin  81 mg Oral Daily   aspirin EC  81 mg Oral Daily   atorvastatin  80 mg Oral QHS   insulin aspart  0-6 Units Subcutaneous TID WC   losartan  12.5 mg Oral Daily   metoprolol tartrate  25 mg Oral BID   pantoprazole  40 mg Oral Daily   sodium chloride flush  3 mL Intravenous Q12H  Continuous Infusions:  sodium chloride 100 mL/hr at 03/15/23 1810    sodium chloride     heparin 1,300 Units/hr (03/16/23 0605)    Diet Order             Diet Heart Room service appropriate? Yes; Fluid consistency: Thin  Diet effective now                  Intake/Output Summary (Last 24 hours) at 03/16/2023 0948 Last data filed at 03/16/2023 0616 Gross per 24 hour  Intake 1748.61 ml  Output --  Net 1748.61 ml    Net IO Since Admission: 2,339.92 mL [03/16/23 0948]  Wt Readings from Last 3 Encounters:  03/16/23 92.3 kg  11/04/21 93 kg  05/09/17 91.2 kg     Unresulted Labs (From admission, onward)     Start     Ordered   03/16/23 1300  Heparin level (unfractionated)  Once-Timed,   TIMED       Question:  Specimen collection method  Answer:  Lab=Lab collect   03/16/23 0442   03/16/23 0500  Heparin level (unfractionated)  Daily,   R      03/14/23 2236   03/15/23 0904  Lipoprotein A (LPA)  Once,   R       Comments: Please draw with next heparin labs   Question:  Specimen collection method  Answer:  Lab=Lab collect   03/15/23 0903   03/15/23 0500  CBC  Daily,   R      03/14/23 2236   03/15/23 0500  Lipoprotein A (LPA)  Tomorrow morning,   R        03/15/23 0031   03/15/23 0500  Basic metabolic panel  Daily,   R      03/15/23 0031          Data Reviewed: I have personally reviewed following labs and imaging studies CBC: Recent Labs  Lab 03/14/23 1511 03/15/23 0213 03/16/23 0313  WBC 9.3 8.9 7.3  NEUTROABS 4.8  --   --   HGB 14.1 13.7 14.0  HCT 40.5 38.9 40.5  MCV 86.4 85.1 85.8  PLT 317 291 290    Basic Metabolic Panel: Recent Labs  Lab 03/14/23 1511 03/15/23 0213 03/16/23 0313  NA 137 132* 135  K 3.8 3.7 4.4  CL 104 101 105  CO2 23 21* 22  GLUCOSE 109* 186* 136*  BUN 11 8 10   CREATININE 0.70 0.73 0.82  CALCIUM 10.1 9.5 9.7  MG  --  2.0  --     GFR: Estimated Creatinine Clearance: 91.9 mL/min (by C-G formula based on SCr of 0.82 mg/dL). Liver Function Tests: Recent Labs  Lab 03/14/23 1511  AST 23  ALT 31   ALKPHOS 44  BILITOT 0.4  PROT 7.2  ALBUMIN 4.9    Recent Labs  Lab 03/14/23 1511  LIPASE <10*    HbA1C: Recent Labs    03/15/23 0217  HGBA1C 6.8*    CBG: Recent Labs  Lab 03/15/23 0805 03/15/23 1202 03/15/23 1803 03/15/23  2126 03/16/23 0851  GLUCAP 137* 104* 105* 162* 179*   No results found for this or any previous visit (from the past 240 hour(s)).  Antimicrobials: Anti-infectives (From admission, onward)    None      Culture/Microbiology No results found for: "SDES", "SPECREQUEST", "CULT", "REPTSTATUS"  Radiology Studies: CARDIAC CATHETERIZATION  Result Date: 03/15/2023   Mid LAD-1 lesion is 90% stenosed.   2nd Diag lesion is 35% stenosed.   Prox RCA lesion is 95% stenosed.   Mid LAD-2 lesion is 20% stenosed.   Non-stenotic Prox RCA to Mid RCA lesion.   The left ventricular systolic function is normal.   LV end diastolic pressure is mildly elevated.   The left ventricular ejection fraction is 55-65% by visual estimate. 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 Normal left circumflex coronary artery. Normal global LV function with EF estimate approximately 50 - 55%.  LVEDP mildly elevated at 21 mmHg. RECOMMENDATION: Consider surgical consultation in light of complex large LAD/diagonal bifurcation stenosis and high-grade large dominant RCA.  Will have colleagues review.  Will reinitiate heparin several hours post TR band removal.   ECHOCARDIOGRAM COMPLETE  Result Date: 03/15/2023    ECHOCARDIOGRAM REPORT   Patient Name:   Angela Knight Date of Exam: 03/15/2023 Medical Rec #:  [id number]     Height:       66.0 in Accession #:    [id number]    Weight:       205.7 lb Date of Birth:  [birth year]     BSA:          2.024 m Patient Age:    51 years      BP:            130/79 mmHg Patient Gender: F             HR:           59 bpm. Exam Location:  Inpatient Procedure: 2D Echo, Color Doppler and Cardiac Doppler Indications:    NSTEMI  History:        Patient has no prior history of Echocardiogram examinations.                 Risk Factors:Hypertension, Diabetes and Dyslipidemia.  Sonographer:    Irving Burton Senior RDCS Referring Phys: [id number] TIMOTHY S OPYD IMPRESSIONS  1. Left ventricular ejection fraction, by estimation, is 65 to 70%. The left ventricle has normal function. The left ventricle has no regional wall motion abnormalities. There is mild concentric left ventricular hypertrophy. Left ventricular diastolic parameters were normal.  2. Right ventricular systolic function is normal. The right ventricular size is mildly enlarged. Tricuspid regurgitation signal is inadequate for assessing PA pressure.  3. The mitral valve is normal in structure. Trivial mitral valve regurgitation.  4. The aortic valve is tricuspid. Aortic valve regurgitation is not visualized.  5. The inferior vena cava is normal in size with greater than 50% respiratory variability, suggesting right atrial pressure of 3 mmHg. FINDINGS  Left Ventricle: Left ventricular ejection fraction, by estimation, is 65 to 70%. The left ventricle has normal function. The left ventricle has no regional wall motion abnormalities. The left ventricular internal cavity size was normal in size. There is  mild concentric left ventricular hypertrophy. Left ventricular diastolic parameters were normal. Right  Ventricle: The right ventricular size is mildly enlarged. No increase in right ventricular wall thickness. Right ventricular systolic function is normal. Tricuspid regurgitation signal is inadequate for assessing PA pressure. Left Atrium: Left atrial size was normal in size. Right Atrium: Right atrial size was normal in size. Pericardium: Trivial pericardial effusion is present. Mitral Valve: The mitral valve is normal in  structure. Trivial mitral valve regurgitation. Tricuspid Valve: The tricuspid valve is normal in structure. Tricuspid valve regurgitation is trivial. Aortic Valve: The aortic valve is tricuspid. Aortic valve regurgitation is not visualized. Pulmonic Valve: The pulmonic valve was normal in structure. Pulmonic valve regurgitation is trivial. Aorta: The aortic root and ascending aorta are structurally normal, with no evidence of dilitation. Venous: The inferior vena cava is normal in size with greater than 50% respiratory variability, suggesting right atrial pressure of 3 mmHg. IAS/Shunts: No atrial level shunt detected by color flow Doppler.  LEFT VENTRICLE PLAX 2D LVIDd:         4.80 cm   Diastology LVIDs:         2.60 cm   LV e' medial:    9.95 cm/s LV PW:         1.20 cm   LV E/e' medial:  8.0 LV IVS:        1.00 cm   LV e' lateral:   14.00 cm/s LVOT diam:     1.70 cm   LV E/e' lateral: 5.7 LV SV:         47 LV SV Index:   23 LVOT Area:     2.27 cm  RIGHT VENTRICLE RV S prime:     8.39 cm/s TAPSE (M-mode): 2.2 cm LEFT ATRIUM             Index        RIGHT ATRIUM           Index LA diam:        2.20 cm 1.09 cm/m   RA Area:     19.30 cm LA Vol (A2C):   36.9 ml 18.23 ml/m  RA Volume:   54.70 ml  27.03 ml/m LA Vol (A4C):   35.2 ml 17.39 ml/m LA Biplane Vol: 39.2 ml 19.37 ml/m  AORTIC VALVE LVOT Vmax:   99.40 cm/s LVOT Vmean:  69.400 cm/s LVOT VTI:    0.205 m  AORTA Ao Root diam: 3.10 cm Ao Asc diam:  2.80 cm MITRAL VALVE MV Area (PHT): 2.96 cm    SHUNTS MV Decel Time: 256 msec    Systemic VTI:  0.20 m MV E velocity: 79.20 cm/s  Systemic Diam: 1.70 cm MV A velocity: 69.70 cm/s MV E/A ratio:  1.14 Donato Schultz MD Electronically signed by Donato Schultz MD Signature Date/Time: 03/15/2023/11:04:05 AM    Final    CT Angio Chest PE W and/or Wo Contrast  Result Date: 03/14/2023 CLINICAL DATA:  Bilateral shoulder and neck pain with heaviness in chest. EXAM: CT ANGIOGRAPHY CHEST WITH CONTRAST TECHNIQUE: Multidetector CT  imaging of the chest was performed using the standard protocol during bolus administration of intravenous contrast. Multiplanar CT image reconstructions and MIPs were obtained to evaluate the vascular anatomy. RADIATION DOSE REDUCTION: This exam was performed according to the departmental dose-optimization program which includes automated exposure control, adjustment of the mA and/or kV according to patient size and/or use of iterative reconstruction technique. CONTRAST:  75mL OMNIPAQUE IOHEXOL 350 MG/ML SOLN COMPARISON:  Chest x-ray earlier 03/14/2023 FINDINGS: Cardiovascular: Heart is nonenlarged. No pericardial effusion.  The thoracic aorta has a normal course and caliber minimal atherosclerotic change. No segmental or larger pulmonary embolism identified. Mediastinum/Nodes: No enlarged mediastinal, hilar, or axillary lymph nodes. Thyroid gland, trachea, and esophagus demonstrate no significant findings. Small hiatal hernia. Lungs/Pleura: There is some mild breathing motion identified. No consolidation, pneumothorax or effusion. Azygous fissure. Upper Abdomen: No acute abnormality.  Fatty liver infiltration. Musculoskeletal: Mild curvature and degenerative changes along the spine. Review of the MIP images confirms the above findings. IMPRESSION: No pulmonary embolism identified. Small hiatal hernia. Aortic Atherosclerosis (ICD10-I70.0). Electronically Signed   By: Karen Kays M.D.   On: 03/14/2023 17:39   DG Chest Portable 1 View  Result Date: 03/14/2023 CLINICAL DATA:  Neck and bilateral shoulder pain with chest heaviness for 2 days. EXAM: PORTABLE CHEST 1 VIEW COMPARISON:  None Available. FINDINGS: The heart size and mediastinal contours are normal. The lungs are clear. There is no pleural effusion or pneumothorax. No acute osseous findings are identified. There are degenerative changes in the thoracic spine. There are calcifications lateral to the left humeral head which could reflect hydroxyapatite  deposition. IMPRESSION: No evidence of acute cardiopulmonary process. Possible hydroxyapatite deposition adjacent to the left humeral head. Electronically Signed   By: Carey Bullocks M.D.   On: 03/14/2023 15:30     LOS: 2 days   Lanae Boast, MD [facility]  03/16/2023, 9:48 AM

## 2023-03-16 NOTE — Progress Notes (Signed)
ANTICOAGULATION CONSULT NOTE  \ Pharmacy Consult for heparin Indication:  CAD awaiting possible CVTS consult  Allergies  Allergen Reactions   Codeine Nausea And Vomiting   Coffee Bean Extract [Coffea Arabica] Other (See Comments)    Religious dietary preference: no coffee, chocolate OK   Other     All narcotics causes extreme vomitting   Lisinopril Cough   Pseudoephedrine Palpitations   Surgical Lubricant Itching and Rash    Surgical glue    Patient Measurements: Height: 5\' 6"  (167.6 cm) Weight: 92.3 kg (203 lb 6.4 oz) IBW/kg (Calculated) : 59.3 Heparin Dosing Weight: 80kg  Vital Signs: Temp: 97.9 F (36.6 C) (05/23 0500) Temp Source: Oral (05/23 0500) BP: 125/65 (05/23 0500) Pulse Rate: 55 (05/23 0500)  Labs: Recent Labs    03/14/23 1511 03/14/23 1728 03/14/23 2236 03/15/23 0213 03/15/23 0217 03/15/23 0218 03/15/23 0649 03/15/23 1208 03/16/23 0313 03/16/23 1332  HGB 14.1  --   --  13.7  --   --   --   --  14.0  --   HCT 40.5  --   --  38.9  --   --   --   --  40.5  --   PLT 317  --   --  291  --   --   --   --  290  --   HEPARINUNFRC  --   --   --   --    < >  --   --  0.13* <0.10* 0.11*  CREATININE 0.70  --   --  0.73  --   --   --   --  0.82  --   TROPONINIHS 57*   < > 122*  --   --  211* 463*  --   --   --    < > = values in this interval not displayed.     Estimated Creatinine Clearance: 91.9 mL/min (by C-G formula based on SCr of 0.82 mg/dL).   Medical History: Past Medical History:  Diagnosis Date   Car sickness    Complication of anesthesia    severe   Family history of adverse reaction to anesthesia    PONV   GERD (gastroesophageal reflux disease)    occ. OTC med used   History of esophageal stricture    stenosis dilatation 05-02-2016   History of gastric polyp    benign 05-02-2016   PONV (postoperative nausea and vomiting)    SVD (spontaneous vaginal delivery)    x 6   Tubulovillous adenoma of rectum    multiple recurrent's   Wears  glasses     Medications:  Scheduled:   aspirin  81 mg Oral Daily   aspirin EC  81 mg Oral Daily   atorvastatin  80 mg Oral QHS   insulin aspart  0-6 Units Subcutaneous TID WC   losartan  12.5 mg Oral Daily   metoprolol tartrate  25 mg Oral BID   pantoprazole  40 mg Oral Daily   sodium chloride flush  3 mL Intravenous Q12H   Infusions:   sodium chloride 100 mL/hr at 03/15/23 1810   sodium chloride     heparin 1,300 Units/hr (03/16/23 6213)    Assessment: 51yo female now s/p cath and awaiting possible surgical consult for severe multivessel CAD. Pharmacy dosing heparin  -heparin level= 0.11 on 1300 unit/hr (heparin was restarted post cath 5/22)  Goal of Therapy:  Heparin level 0.3-0.7 units/ml Monitor platelets by anticoagulation protocol: Yes  Plan:  -Increase heparin to 1550 units/hr -Heparin level in 6 hours and daily wth CBC daily  Harland German, PharmD Clinical Pharmacist **Pharmacist phone directory can now be found on [email or web address] ([email or web address]).  Listed under [facility].

## 2023-03-16 NOTE — H&P (View-Only) (Signed)
  Progress Note  Patient Name: Angela Knight Date of Encounter: 03/16/2023  Primary Cardiologist:New to Dr. Talitha Dicarlo  Subjective   Overnight still having some arm pain.  Otherwise well, tearful about CAD; many questions about disease.  Inpatient Medications    Scheduled Meds:  aspirin  81 mg Oral Daily   aspirin EC  81 mg Oral Daily   atorvastatin  80 mg Oral QHS   insulin aspart  0-6 Units Subcutaneous TID WC   losartan  12.5 mg Oral Daily   metoprolol tartrate  25 mg Oral BID   pantoprazole  40 mg Oral Daily   sodium chloride flush  3 mL Intravenous Q12H   Continuous Infusions:  sodium chloride 100 mL/hr at 03/15/23 1810   sodium chloride     heparin 1,300 Units/hr (03/16/23 0605)   PRN Meds: sodium chloride, acetaminophen, diazepam, nitroGLYCERIN, ondansetron (ZOFRAN) IV, oxyCODONE, sodium chloride flush   Vital Signs    Vitals:   03/15/23 1715 03/15/23 1731 03/15/23 2100 03/16/23 0500  BP: (!) 153/83  137/69 125/65  Pulse: 65 68 63 (!) 55  Resp: 19 18 15 17  Temp:   98 F (36.7 C) 97.9 F (36.6 C)  TempSrc:   Oral Oral  SpO2: 100% 99% 99% 98%  Weight:    92.3 kg  Height:        Intake/Output Summary (Last 24 hours) at 03/16/2023 0747 Last data filed at 03/16/2023 0616 Gross per 24 hour  Intake 1748.61 ml  Output --  Net 1748.61 ml   Filed Weights   03/14/23 2229 03/15/23 0355 03/16/23 0500  Weight: 93.3 kg 93.3 kg 92.3 kg    Telemetry    SR to sinus bradycardia - Personally Reviewed   Physical Exam   Gen: no distress   Neck: No JVD Cardiac: No Rubs or Gallops, no murmur, regula rhythm+2 radial pulses, tender R arm with no bruits or hematomas Respiratory: Clear to auscultation bilaterally, normal effort, normal  respiratory rate GI: Soft, nontender, non-distended  MS: No  edema;  moves all extremities Integument: Skin feels warm Neuro:  At time of evaluation, alert and oriented to person/place/time/situation  Psych: Normal affect,  patient feels well   Labs    Chemistry Recent Labs  Lab 03/14/23 1511 03/15/23 0213 03/16/23 0313  NA 137 132* 135  K 3.8 3.7 4.4  CL 104 101 105  CO2 23 21* 22  GLUCOSE 109* 186* 136*  BUN 11 8 10  CREATININE 0.70 0.73 0.82  CALCIUM 10.1 9.5 9.7  PROT 7.2  --   --   ALBUMIN 4.9  --   --   AST 23  --   --   ALT 31  --   --   ALKPHOS 44  --   --   BILITOT 0.4  --   --   GFRNONAA >60 >60 >60  ANIONGAP 10 10 8     Hematology Recent Labs  Lab 03/14/23 1511 03/15/23 0213 03/16/23 0313  WBC 9.3 8.9 7.3  RBC 4.69 4.57 4.72  HGB 14.1 13.7 14.0  HCT 40.5 38.9 40.5  MCV 86.4 85.1 85.8  MCH 30.1 30.0 29.7  MCHC 34.8 35.2 34.6  RDW 13.6 13.4 13.3  PLT 317 291 290    Cardiac EnzymesNo results for input(s): "TROPONINI" in the last 168 hours. No results for input(s): "TROPIPOC" in the last 168 hours.   BNPNo results for input(s): "BNP", "PROBNP" in the last 168 hours.   DDimer   No results for input(s): "DDIMER" in the last 168 hours.   Radiology    CARDIAC CATHETERIZATION  Result Date: 03/15/2023   Mid LAD-1 lesion is 90% stenosed.   2nd Diag lesion is 35% stenosed.   Prox RCA lesion is 95% stenosed.   Mid LAD-2 lesion is 20% stenosed.   Non-stenotic Prox RCA to Mid RCA lesion.   The left ventricular systolic function is normal.   LV end diastolic pressure is mildly elevated.   The left ventricular ejection fraction is 55-65% by visual estimate. 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 Normal left circumflex coronary artery. Normal global LV function with EF estimate approximately 50 - 55%.  LVEDP mildly elevated at 21 mmHg. RECOMMENDATION: Consider surgical consultation in light of complex large LAD/diagonal bifurcation stenosis and high-grade large dominant  RCA.  Will have colleagues review.  Will reinitiate heparin several hours post TR band removal.   ECHOCARDIOGRAM COMPLETE  Result Date: 03/15/2023    ECHOCARDIOGRAM REPORT   Patient Name:   Angela Knight Date of Exam: 03/15/2023 Medical Rec #:  [id number]     Height:       66.0 in Accession #:    [id number]    Weight:       205.7 lb Date of Birth:  10/28/1970     BSA:          2.024 m Patient Age:    52 years      BP:           130/79 mmHg Patient Gender: F             HR:           59 bpm. Exam Location:  Inpatient Procedure: 2D Echo, Color Doppler and Cardiac Doppler Indications:    NSTEMI  History:        Patient has no prior history of Echocardiogram examinations.                 Risk Factors:Hypertension, Diabetes and Dyslipidemia.  Sonographer:    Emily Senior RDCS Referring Phys: [id number] TIMOTHY S OPYD IMPRESSIONS  1. Left ventricular ejection fraction, by estimation, is 65 to 70%. The left ventricle has normal function. The left ventricle has no regional wall motion abnormalities. There is mild concentric left ventricular hypertrophy. Left ventricular diastolic parameters were normal.  2. Right ventricular systolic function is normal. The right ventricular size is mildly enlarged. Tricuspid regurgitation signal is inadequate for assessing PA pressure.  3. The mitral valve is normal in structure. Trivial mitral valve regurgitation.  4. The aortic valve is tricuspid. Aortic valve regurgitation is not visualized.  5. The inferior vena cava is normal in size with greater than 50% respiratory variability, suggesting right atrial pressure of 3 mmHg. FINDINGS  Left Ventricle: Left ventricular ejection fraction, by estimation, is 65 to 70%. The left ventricle has normal function. The left ventricle has no regional wall motion abnormalities. The left ventricular internal cavity size was normal in size. There is  mild concentric left ventricular hypertrophy. Left ventricular diastolic parameters were normal. Right  Ventricle: The right ventricular size is mildly enlarged. No increase in right ventricular wall thickness. Right ventricular systolic function is normal. Tricuspid regurgitation signal is inadequate for assessing PA pressure. Left Atrium: Left atrial size was   normal in size. Right Atrium: Right atrial size was normal in size. Pericardium: Trivial pericardial effusion is present. Mitral Valve: The mitral valve is normal in structure. Trivial mitral valve regurgitation. Tricuspid Valve: The tricuspid valve is normal in structure. Tricuspid valve regurgitation is trivial. Aortic Valve: The aortic valve is tricuspid. Aortic valve regurgitation is not visualized. Pulmonic Valve: The pulmonic valve was normal in structure. Pulmonic valve regurgitation is trivial. Aorta: The aortic root and ascending aorta are structurally normal, with no evidence of dilitation. Venous: The inferior vena cava is normal in size with greater than 50% respiratory variability, suggesting right atrial pressure of 3 mmHg. IAS/Shunts: No atrial level shunt detected by color flow Doppler.  LEFT VENTRICLE PLAX 2D LVIDd:         4.80 cm   Diastology LVIDs:         2.60 cm   LV e' medial:    9.95 cm/s LV PW:         1.20 cm   LV E/e' medial:  8.0 LV IVS:        1.00 cm   LV e' lateral:   14.00 cm/s LVOT diam:     1.70 cm   LV E/e' lateral: 5.7 LV SV:         47 LV SV Index:   23 LVOT Area:     2.27 cm  RIGHT VENTRICLE RV S prime:     8.39 cm/s TAPSE (M-mode): 2.2 cm LEFT ATRIUM             Index        RIGHT ATRIUM           Index LA diam:        2.20 cm 1.09 cm/m   RA Area:     19.30 cm LA Vol (A2C):   36.9 ml 18.23 ml/m  RA Volume:   54.70 ml  27.03 ml/m LA Vol (A4C):   35.2 ml 17.39 ml/m LA Biplane Vol: 39.2 ml 19.37 ml/m  AORTIC VALVE LVOT Vmax:   99.40 cm/s LVOT Vmean:  69.400 cm/s LVOT VTI:    0.205 m  AORTA Ao Root diam: 3.10 cm Ao Asc diam:  2.80 cm MITRAL VALVE MV Area (PHT): 2.96 cm    SHUNTS MV Decel Time: 256 msec    Systemic VTI:   0.20 m MV E velocity: 79.20 cm/s  Systemic Diam: 1.70 cm MV A velocity: 69.70 cm/s MV E/A ratio:  1.14 Mark Skains MD Electronically signed by Mark Skains MD Signature Date/Time: 03/15/2023/11:04:05 AM    Final    CT Angio Chest PE W and/or Wo Contrast  Result Date: 03/14/2023 CLINICAL DATA:  Bilateral shoulder and neck pain with heaviness in chest. EXAM: CT ANGIOGRAPHY CHEST WITH CONTRAST TECHNIQUE: Multidetector CT imaging of the chest was performed using the standard protocol during bolus administration of intravenous contrast. Multiplanar CT image reconstructions and MIPs were obtained to evaluate the vascular anatomy. RADIATION DOSE REDUCTION: This exam was performed according to the departmental dose-optimization program which includes automated exposure control, adjustment of the mA and/or kV according to patient size and/or use of iterative reconstruction technique. CONTRAST:  75mL OMNIPAQUE IOHEXOL 350 MG/ML SOLN COMPARISON:  Chest x-ray earlier 03/14/2023 FINDINGS: Cardiovascular: Heart is nonenlarged. No pericardial effusion. The thoracic aorta has a normal course and caliber minimal atherosclerotic change. No segmental or larger pulmonary embolism identified. Mediastinum/Nodes: No enlarged mediastinal, hilar, or axillary lymph nodes. Thyroid gland, trachea, and esophagus demonstrate no significant findings.   Small hiatal hernia. Lungs/Pleura: There is some mild breathing motion identified. No consolidation, pneumothorax or effusion. Azygous fissure. Upper Abdomen: No acute abnormality.  Fatty liver infiltration. Musculoskeletal: Mild curvature and degenerative changes along the spine. Review of the MIP images confirms the above findings. IMPRESSION: No pulmonary embolism identified. Small hiatal hernia. Aortic Atherosclerosis (ICD10-I70.0). Electronically Signed   By: Ashok  Gupta M.D.   On: 03/14/2023 17:39   DG Chest Portable 1 View  Result Date: 03/14/2023 CLINICAL DATA:  Neck and bilateral  shoulder pain with chest heaviness for 2 days. EXAM: PORTABLE CHEST 1 VIEW COMPARISON:  None Available. FINDINGS: The heart size and mediastinal contours are normal. The lungs are clear. There is no pleural effusion or pneumothorax. No acute osseous findings are identified. There are degenerative changes in the thoracic spine. There are calcifications lateral to the left humeral head which could reflect hydroxyapatite deposition. IMPRESSION: No evidence of acute cardiopulmonary process. Possible hydroxyapatite deposition adjacent to the left humeral head. Electronically Signed   By: William  Veazey M.D.   On: 03/14/2023 15:30     Patient Profile     51 y.o. female with NSTEMI  Assessment & Plan     NSTEMI CAD and HLD - with risk factors of DM and unstable anginal symptoms with exertion PTA - anatomy: Prox-mid LAD disease and D1 bifurcation; mid RCA disease - continue ASA 81 - increaseing statin, goal LDL < 55 with elevated Tgs,  - no plans to increase BB - no nitrates needed - continue ARB (for renal protection largely) - CT surgery consultation has been requested, Heart team meeting has been requested - discussed post intervention cardiac rehab - reviewed her options at length: she would like whatever the most durable intervention is; she is not particularly excited about surgery     For questions or updates, please contact [facility] Please consult [email or web address] for contact info under Cardiology/STEMI.      Arvis Zwahlen, MD FASE FACC Cardiologist [place]  [facility]  [address], [address] [place], [place] [address] [phone]  7:47 AM    

## 2023-03-16 NOTE — Plan of Care (Signed)
Problem: Education: Goal: Knowledge of General Education information will improve Description: Including pain rating scale, medication(s)/side effects and non-pharmacologic comfort measures Outcome: Progressing   Problem: Health Behavior/Discharge Planning: Goal: Ability to manage health-related needs will improve Outcome: Progressing   Problem: Clinical Measurements: Goal: Ability to maintain clinical measurements within normal limits will improve Outcome: Progressing Goal: Will remain free from infection Outcome: Progressing Goal: Diagnostic test results will improve Outcome: Progressing Goal: Respiratory complications will improve Outcome: Progressing Goal: Cardiovascular complication will be avoided Outcome: Progressing   Problem: Activity: Goal: Risk for activity intolerance will decrease Outcome: Progressing   Problem: Nutrition: Goal: Adequate nutrition will be maintained Outcome: Progressing   Problem: Coping: Goal: Level of anxiety will decrease Outcome: Progressing   Problem: Elimination: Goal: Will not experience complications related to bowel motility Outcome: Progressing Goal: Will not experience complications related to urinary retention Outcome: Progressing   Problem: Pain Managment: Goal: General experience of comfort will improve Outcome: Progressing   Problem: Safety: Goal: Ability to remain free from injury will improve Outcome: Progressing   Problem: Skin Integrity: Goal: Risk for impaired skin integrity will decrease Outcome: Progressing   Problem: Education: Goal: Ability to describe self-care measures that may prevent or decrease complications (Diabetes Survival Skills Education) will improve Outcome: Progressing Goal: Individualized Educational Video(s) Outcome: Progressing   Problem: Coping: Goal: Ability to adjust to condition or change in health will improve Outcome: Progressing   Problem: Fluid Volume: Goal: Ability to  maintain a balanced intake and output will improve Outcome: Progressing   Problem: Health Behavior/Discharge Planning: Goal: Ability to identify and utilize available resources and services will improve Outcome: Progressing Goal: Ability to manage health-related needs will improve Outcome: Progressing   Problem: Metabolic: Goal: Ability to maintain appropriate glucose levels will improve Outcome: Progressing   Problem: Nutritional: Goal: Maintenance of adequate nutrition will improve Outcome: Progressing Goal: Progress toward achieving an optimal weight will improve Outcome: Progressing   Problem: Skin Integrity: Goal: Risk for impaired skin integrity will decrease Outcome: Progressing   Problem: Tissue Perfusion: Goal: Adequacy of tissue perfusion will improve Outcome: Progressing   Problem: Education: Goal: Understanding of cardiac disease, CV risk reduction, and recovery process will improve Outcome: Progressing Goal: Individualized Educational Video(s) Outcome: Progressing   Problem: Activity: Goal: Ability to tolerate increased activity will improve Outcome: Progressing   Problem: Cardiac: Goal: Ability to achieve and maintain adequate cardiovascular perfusion will improve Outcome: Progressing   Problem: Health Behavior/Discharge Planning: Goal: Ability to safely manage health-related needs after discharge will improve Outcome: Progressing   Problem: Education: Goal: Understanding of CV disease, CV risk reduction, and recovery process will improve Outcome: Progressing Goal: Individualized Educational Video(s) Outcome: Progressing   Problem: Activity: Goal: Ability to return to baseline activity level will improve Outcome: Progressing   Problem: Cardiovascular: Goal: Ability to achieve and maintain adequate cardiovascular perfusion will improve Outcome: Progressing Goal: Vascular access site(s) Level 0-1 will be maintained Outcome: Progressing    Problem: Health Behavior/Discharge Planning: Goal: Ability to safely manage health-related needs after discharge will improve Outcome: Progressing   Problem: Education: Goal: Understanding of CV disease, CV risk reduction, and recovery process will improve Outcome: Progressing Goal: Individualized Educational Video(s) Outcome: Progressing   Problem: Activity: Goal: Ability to return to baseline activity level will improve Outcome: Progressing   Problem: Cardiovascular: Goal: Ability to achieve and maintain adequate cardiovascular perfusion will improve Outcome: Progressing Goal: Vascular access site(s) Level 0-1 will be maintained Outcome: Progressing   Problem: Health Behavior/Discharge Planning: Goal:  Ability to safely manage health-related needs after discharge will improve Outcome: Progressing

## 2023-03-16 NOTE — Progress Notes (Addendum)
ANTICOAGULATION CONSULT NOTE - Initial Consult  Pharmacy Consult for heparin Indication:  CAD awaiting possible CVTS consult  Allergies  Allergen Reactions   Codeine Nausea And Vomiting   Coffee Bean Extract [Coffea Arabica] Other (See Comments)    Religious dietary preference: no coffee, chocolate OK   Other     All narcotics causes extreme vomitting   Pseudoephedrine Palpitations   Surgical Lubricant Itching and Rash    Surgical glue    Patient Measurements: Height: 5\' 6"  (167.6 cm) Weight: 93.3 kg (205 lb 11.2 oz) IBW/kg (Calculated) : 59.3 Heparin Dosing Weight: 80kg  Vital Signs: Temp: 98 F (36.7 C) (05/22 2100) Temp Source: Oral (05/22 2100) BP: 137/69 (05/22 2100) Pulse Rate: 63 (05/22 2100)  Labs: Recent Labs    03/14/23 1511 03/14/23 1728 03/14/23 2236 03/15/23 0213 03/15/23 0217 03/15/23 0218 03/15/23 0649 03/15/23 1208 03/16/23 0313  HGB 14.1  --   --  13.7  --   --   --   --  14.0  HCT 40.5  --   --  38.9  --   --   --   --  40.5  PLT 317  --   --  291  --   --   --   --  290  HEPARINUNFRC  --   --   --   --  <0.10*  --   --  0.13* <0.10*  CREATININE 0.70  --   --  0.73  --   --   --   --  0.82  TROPONINIHS 57*   < > 122*  --   --  211* 463*  --   --    < > = values in this interval not displayed.    Estimated Creatinine Clearance: 92.4 mL/min (by C-G formula based on SCr of 0.82 mg/dL).   Medical History: Past Medical History:  Diagnosis Date   Car sickness    Complication of anesthesia    severe   Family history of adverse reaction to anesthesia    PONV   GERD (gastroesophageal reflux disease)    occ. OTC med used   History of esophageal stricture    stenosis dilatation 05-02-2016   History of gastric polyp    benign 05-02-2016   PONV (postoperative nausea and vomiting)    SVD (spontaneous vaginal delivery)    x 6   Tubulovillous adenoma of rectum    multiple recurrent's   Wears glasses     Medications:  Scheduled:   aspirin   81 mg Oral Daily   aspirin EC  81 mg Oral Daily   atorvastatin  80 mg Oral QHS   insulin aspart  0-6 Units Subcutaneous TID WC   losartan  12.5 mg Oral Daily   metoprolol tartrate  25 mg Oral BID   pantoprazole  40 mg Oral Daily   sodium chloride flush  3 mL Intravenous Q12H   Infusions:   sodium chloride 100 mL/hr at 03/15/23 1810   sodium chloride      Assessment: 52yo female now s/p cath and awaiting possible surgical consult for severe multivessel CAD, to resume heparin 2h after TR band removed; pt underwent slow removal of TR band and fully off at 0307 with no hematoma or bleeding though RN does note a bruise that was present all shift.  Goal of Therapy:  Heparin level 0.3-0.7 units/ml Monitor platelets by anticoagulation protocol: Yes   Plan:  At 0530 will start heparin infusion at 1300  units/hr. Monitor heparin levels and CBC.  Vernard Gambles, PharmD, BCPS  03/16/2023,4:36 AM

## 2023-03-16 NOTE — Progress Notes (Signed)
Progress Note  Patient Name: Angela Knight Date of Encounter: 03/16/2023  Primary Cardiologist:New to Dr. Izora Ribas  Subjective   Overnight still having some arm pain.  Otherwise well, tearful about CAD; many questions about disease.  Inpatient Medications    Scheduled Meds:  aspirin  81 mg Oral Daily   aspirin EC  81 mg Oral Daily   atorvastatin  80 mg Oral QHS   insulin aspart  0-6 Units Subcutaneous TID WC   losartan  12.5 mg Oral Daily   metoprolol tartrate  25 mg Oral BID   pantoprazole  40 mg Oral Daily   sodium chloride flush  3 mL Intravenous Q12H   Continuous Infusions:  sodium chloride 100 mL/hr at 03/15/23 1810   sodium chloride     heparin 1,300 Units/hr (03/16/23 0605)   PRN Meds: sodium chloride, acetaminophen, diazepam, nitroGLYCERIN, ondansetron (ZOFRAN) IV, oxyCODONE, sodium chloride flush   Vital Signs    Vitals:   03/15/23 1715 03/15/23 1731 03/15/23 2100 03/16/23 0500  BP: (!) 153/83  137/69 125/65  Pulse: 65 68 63 (!) 55  Resp: 19 18 15 17   Temp:   98 F (36.7 C) 97.9 F (36.6 C)  TempSrc:   Oral Oral  SpO2: 100% 99% 99% 98%  Weight:    92.3 kg  Height:        Intake/Output Summary (Last 24 hours) at 03/16/2023 0747 Last data filed at 03/16/2023 4098 Gross per 24 hour  Intake 1748.61 ml  Output --  Net 1748.61 ml   Filed Weights   03/14/23 2229 03/15/23 0355 03/16/23 0500  Weight: 93.3 kg 93.3 kg 92.3 kg    Telemetry    SR to sinus bradycardia - Personally Reviewed   Physical Exam   Gen: no distress   Neck: No JVD Cardiac: No Rubs or Gallops, no murmur, regula rhythm+2 radial pulses, tender R arm with no bruits or hematomas Respiratory: Clear to auscultation bilaterally, normal effort, normal  respiratory rate GI: Soft, nontender, non-distended  MS: No  edema;  moves all extremities Integument: Skin feels warm Neuro:  At time of evaluation, alert and oriented to person/place/time/situation  Psych: Normal affect,  patient feels well   Labs    Chemistry Recent Labs  Lab 03/14/23 1511 03/15/23 0213 03/16/23 0313  NA 137 132* 135  K 3.8 3.7 4.4  CL 104 101 105  CO2 23 21* 22  GLUCOSE 109* 186* 136*  BUN 11 8 10   CREATININE 0.70 0.73 0.82  CALCIUM 10.1 9.5 9.7  PROT 7.2  --   --   ALBUMIN 4.9  --   --   AST 23  --   --   ALT 31  --   --   ALKPHOS 44  --   --   BILITOT 0.4  --   --   GFRNONAA >60 >60 >60  ANIONGAP 10 10 8      Hematology Recent Labs  Lab 03/14/23 1511 03/15/23 0213 03/16/23 0313  WBC 9.3 8.9 7.3  RBC 4.69 4.57 4.72  HGB 14.1 13.7 14.0  HCT 40.5 38.9 40.5  MCV 86.4 85.1 85.8  MCH 30.1 30.0 29.7  MCHC 34.8 35.2 34.6  RDW 13.6 13.4 13.3  PLT 317 291 290    Cardiac EnzymesNo results for input(s): "TROPONINI" in the last 168 hours. No results for input(s): "TROPIPOC" in the last 168 hours.   BNPNo results for input(s): "BNP", "PROBNP" in the last 168 hours.   DDimer  No results for input(s): "DDIMER" in the last 168 hours.   Radiology    CARDIAC CATHETERIZATION  Result Date: 03/15/2023   Mid LAD-1 lesion is 90% stenosed.   2nd Diag lesion is 35% stenosed.   Prox RCA lesion is 95% stenosed.   Mid LAD-2 lesion is 20% stenosed.   Non-stenotic Prox RCA to Mid RCA lesion.   The left ventricular systolic function is normal.   LV end diastolic pressure is mildly elevated.   The left ventricular ejection fraction is 55-65% by visual estimate. 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 Normal left circumflex coronary artery. Normal global LV function with EF estimate approximately 50 - 55%.  LVEDP mildly elevated at 21 mmHg. RECOMMENDATION: Consider surgical consultation in light of complex large LAD/diagonal bifurcation stenosis and high-grade large dominant  RCA.  Will have colleagues review.  Will reinitiate heparin several hours post TR band removal.   ECHOCARDIOGRAM COMPLETE  Result Date: 03/15/2023    ECHOCARDIOGRAM REPORT   Patient Name:   [name] Date of Exam: 03/15/2023 Medical Rec #:  [id number]     Height:       66.0 in Accession #:    [id number]    Weight:       205.7 lb Date of Birth:  October 12, 1971     BSA:          2.024 m Patient Age:    52 years      BP:           130/79 mmHg Patient Gender: F             HR:           59 bpm. Exam Location:  Inpatient Procedure: 2D Echo, Color Doppler and Cardiac Doppler Indications:    NSTEMI  History:        Patient has no prior history of Echocardiogram examinations.                 Risk Factors:Hypertension, Diabetes and Dyslipidemia.  Sonographer:    Irving Burton Senior RDCS Referring Phys: [id number] TIMOTHY S OPYD IMPRESSIONS  1. Left ventricular ejection fraction, by estimation, is 65 to 70%. The left ventricle has normal function. The left ventricle has no regional wall motion abnormalities. There is mild concentric left ventricular hypertrophy. Left ventricular diastolic parameters were normal.  2. Right ventricular systolic function is normal. The right ventricular size is mildly enlarged. Tricuspid regurgitation signal is inadequate for assessing PA pressure.  3. The mitral valve is normal in structure. Trivial mitral valve regurgitation.  4. The aortic valve is tricuspid. Aortic valve regurgitation is not visualized.  5. The inferior vena cava is normal in size with greater than 50% respiratory variability, suggesting right atrial pressure of 3 mmHg. FINDINGS  Left Ventricle: Left ventricular ejection fraction, by estimation, is 65 to 70%. The left ventricle has normal function. The left ventricle has no regional wall motion abnormalities. The left ventricular internal cavity size was normal in size. There is  mild concentric left ventricular hypertrophy. Left ventricular diastolic parameters were normal. Right  Ventricle: The right ventricular size is mildly enlarged. No increase in right ventricular wall thickness. Right ventricular systolic function is normal. Tricuspid regurgitation signal is inadequate for assessing PA pressure. Left Atrium: Left atrial size was  normal in size. Right Atrium: Right atrial size was normal in size. Pericardium: Trivial pericardial effusion is present. Mitral Valve: The mitral valve is normal in structure. Trivial mitral valve regurgitation. Tricuspid Valve: The tricuspid valve is normal in structure. Tricuspid valve regurgitation is trivial. Aortic Valve: The aortic valve is tricuspid. Aortic valve regurgitation is not visualized. Pulmonic Valve: The pulmonic valve was normal in structure. Pulmonic valve regurgitation is trivial. Aorta: The aortic root and ascending aorta are structurally normal, with no evidence of dilitation. Venous: The inferior vena cava is normal in size with greater than 50% respiratory variability, suggesting right atrial pressure of 3 mmHg. IAS/Shunts: No atrial level shunt detected by color flow Doppler.  LEFT VENTRICLE PLAX 2D LVIDd:         4.80 cm   Diastology LVIDs:         2.60 cm   LV e' medial:    9.95 cm/s LV PW:         1.20 cm   LV E/e' medial:  8.0 LV IVS:        1.00 cm   LV e' lateral:   14.00 cm/s LVOT diam:     1.70 cm   LV E/e' lateral: 5.7 LV SV:         47 LV SV Index:   23 LVOT Area:     2.27 cm  RIGHT VENTRICLE RV S prime:     8.39 cm/s TAPSE (M-mode): 2.2 cm LEFT ATRIUM             Index        RIGHT ATRIUM           Index LA diam:        2.20 cm 1.09 cm/m   RA Area:     19.30 cm LA Vol (A2C):   36.9 ml 18.23 ml/m  RA Volume:   54.70 ml  27.03 ml/m LA Vol (A4C):   35.2 ml 17.39 ml/m LA Biplane Vol: 39.2 ml 19.37 ml/m  AORTIC VALVE LVOT Vmax:   99.40 cm/s LVOT Vmean:  69.400 cm/s LVOT VTI:    0.205 m  AORTA Ao Root diam: 3.10 cm Ao Asc diam:  2.80 cm MITRAL VALVE MV Area (PHT): 2.96 cm    SHUNTS MV Decel Time: 256 msec    Systemic VTI:   0.20 m MV E velocity: 79.20 cm/s  Systemic Diam: 1.70 cm MV A velocity: 69.70 cm/s MV E/A ratio:  1.14 Donato Schultz MD Electronically signed by Donato Schultz MD Signature Date/Time: 03/15/2023/11:04:05 AM    Final    CT Angio Chest PE W and/or Wo Contrast  Result Date: 03/14/2023 CLINICAL DATA:  Bilateral shoulder and neck pain with heaviness in chest. EXAM: CT ANGIOGRAPHY CHEST WITH CONTRAST TECHNIQUE: Multidetector CT imaging of the chest was performed using the standard protocol during bolus administration of intravenous contrast. Multiplanar CT image reconstructions and MIPs were obtained to evaluate the vascular anatomy. RADIATION DOSE REDUCTION: This exam was performed according to the departmental dose-optimization program which includes automated exposure control, adjustment of the mA and/or kV according to patient size and/or use of iterative reconstruction technique. CONTRAST:  75mL OMNIPAQUE IOHEXOL 350 MG/ML SOLN COMPARISON:  Chest x-ray earlier 03/14/2023 FINDINGS: Cardiovascular: Heart is nonenlarged. No pericardial effusion. The thoracic aorta has a normal course and caliber minimal atherosclerotic change. No segmental or larger pulmonary embolism identified. Mediastinum/Nodes: No enlarged mediastinal, hilar, or axillary lymph nodes. Thyroid gland, trachea, and esophagus demonstrate no significant findings.  Small hiatal hernia. Lungs/Pleura: There is some mild breathing motion identified. No consolidation, pneumothorax or effusion. Azygous fissure. Upper Abdomen: No acute abnormality.  Fatty liver infiltration. Musculoskeletal: Mild curvature and degenerative changes along the spine. Review of the MIP images confirms the above findings. IMPRESSION: No pulmonary embolism identified. Small hiatal hernia. Aortic Atherosclerosis (ICD10-I70.0). Electronically Signed   By: Karen Kays M.D.   On: 03/14/2023 17:39   DG Chest Portable 1 View  Result Date: 03/14/2023 CLINICAL DATA:  Neck and bilateral  shoulder pain with chest heaviness for 2 days. EXAM: PORTABLE CHEST 1 VIEW COMPARISON:  None Available. FINDINGS: The heart size and mediastinal contours are normal. The lungs are clear. There is no pleural effusion or pneumothorax. No acute osseous findings are identified. There are degenerative changes in the thoracic spine. There are calcifications lateral to the left humeral head which could reflect hydroxyapatite deposition. IMPRESSION: No evidence of acute cardiopulmonary process. Possible hydroxyapatite deposition adjacent to the left humeral head. Electronically Signed   By: Carey Bullocks M.D.   On: 03/14/2023 15:30     Patient Profile     52 y.o. female with NSTEMI  Assessment & Plan     NSTEMI CAD and HLD - with risk factors of DM and unstable anginal symptoms with exertion PTA - anatomy: Prox-mid LAD disease and D1 bifurcation; mid RCA disease - continue ASA 81 - increaseing statin, goal LDL < 55 with elevated Tgs,  - no plans to increase BB - no nitrates needed - continue ARB (for renal protection largely) - CT surgery consultation has been requested, Heart team meeting has been requested - discussed post intervention cardiac rehab - reviewed her options at length: she would like whatever the most durable intervention is; she is not particularly excited about surgery     For questions or updates, please contact [facility] Please consult [email or web address] for contact info under Cardiology/STEMI.      Riley Lam, MD FASE [facility] Cardiologist [facility]  [address] [place], [address] [place], [place] [address] [phone]  7:47 AM

## 2023-03-16 NOTE — Progress Notes (Signed)
ANTICOAGULATION CONSULT NOTE   Pharmacy Consult for heparin infusion Indication:  CAD awaiting possible CVTS consult  Allergies  Allergen Reactions   Codeine Nausea And Vomiting   Coffee Bean Extract [Coffea Arabica] Other (See Comments)    Religious dietary preference: no coffee, chocolate OK   Other     All narcotics causes extreme vomitting   Lisinopril Cough   Pseudoephedrine Palpitations   Surgical Lubricant Itching and Rash    Surgical glue    Patient Measurements: Height: 5\' 6"  (167.6 cm) Weight: 92.3 kg (203 lb 6.4 oz) IBW/kg (Calculated) : 59.3 Heparin Dosing Weight: 80kg  Vital Signs: Temp: 99.3 F (37.4 C) (05/23 2002) Temp Source: Oral (05/23 2002) BP: 121/63 (05/23 2200) Pulse Rate: 52 (05/23 2200)  Labs: Recent Labs    03/14/23 1511 03/14/23 1728 03/14/23 2236 03/15/23 0213 03/15/23 0217 03/15/23 0218 03/15/23 0649 03/15/23 1208 03/16/23 0313 03/16/23 1332 03/16/23 2048  HGB 14.1  --   --  13.7  --   --   --   --  14.0  --   --   HCT 40.5  --   --  38.9  --   --   --   --  40.5  --   --   PLT 317  --   --  291  --   --   --   --  290  --   --   HEPARINUNFRC  --   --   --   --    < >  --   --    < > <0.10* 0.11* 0.29*  CREATININE 0.70  --   --  0.73  --   --   --   --  0.82  --   --   TROPONINIHS 57*   < > 122*  --   --  211* 463*  --   --   --   --    < > = values in this interval not displayed.     Estimated Creatinine Clearance: 91.9 mL/min (by C-G formula based on SCr of 0.82 mg/dL).   Medical History: Past Medical History:  Diagnosis Date   Car sickness    Complication of anesthesia    severe   Family history of adverse reaction to anesthesia    PONV   GERD (gastroesophageal reflux disease)    occ. OTC med used   History of esophageal stricture    stenosis dilatation 05-02-2016   History of gastric polyp    benign 05-02-2016   PONV (postoperative nausea and vomiting)    SVD (spontaneous vaginal delivery)    x 6    Tubulovillous adenoma of rectum    multiple recurrent's   Wears glasses     Medications:  Scheduled:   aspirin  81 mg Oral Daily   aspirin EC  81 mg Oral Daily   atorvastatin  80 mg Oral QHS   insulin aspart  0-6 Units Subcutaneous TID WC   losartan  12.5 mg Oral Daily   metoprolol tartrate  25 mg Oral BID   pantoprazole  40 mg Oral Daily   polyethylene glycol  34 g Oral Once   sodium chloride flush  3 mL Intravenous Q12H   Infusions:   sodium chloride 100 mL/hr at 03/15/23 1810   sodium chloride     heparin 1,550 Units/hr (03/16/23 2229)    Assessment: 51yo female now s/p cath and awaiting possible surgical consult for severe multivessel CAD. Pharmacy  dosing heparin   -heparin level= 0.29 is slightly subtherapeutic on heparin infusion at 1550 unit/hr -no s/sx of bleeding noted per RN  Goal of Therapy:  Heparin level 0.3-0.7 units/ml Monitor platelets by anticoagulation protocol: Yes   Plan:  -Increase heparin to 1650 units/hr -Heparin level in 6 hours and daily wth CBC daily -Monitor for s/sx of bleeding   Wilburn Cornelia, PharmD, BCPS Clinical Pharmacist 03/16/2023 11:19 PM   Please refer to AMION for pharmacy phone number

## 2023-03-17 ENCOUNTER — Other Ambulatory Visit (HOSPITAL_COMMUNITY): Payer: Self-pay

## 2023-03-17 ENCOUNTER — Encounter (HOSPITAL_COMMUNITY)
Admission: EM | Disposition: A | Discharge status: Home / Self Care | Payer: Self-pay | Source: Home / Self Care | Attending: Internal Medicine

## 2023-03-17 ENCOUNTER — Other Ambulatory Visit: Payer: Self-pay

## 2023-03-17 DIAGNOSIS — I2510 Atherosclerotic heart disease of native coronary artery without angina pectoris: Secondary | ICD-10-CM | POA: Diagnosis not present

## 2023-03-17 DIAGNOSIS — I214 Non-ST elevation (NSTEMI) myocardial infarction: Secondary | ICD-10-CM | POA: Diagnosis not present

## 2023-03-17 DIAGNOSIS — E785 Hyperlipidemia, unspecified: Secondary | ICD-10-CM | POA: Diagnosis not present

## 2023-03-17 HISTORY — PX: CORONARY ULTRASOUND/IVUS: CATH118244

## 2023-03-17 HISTORY — PX: LEFT HEART CATH AND CORONARY ANGIOGRAPHY: CATH118249

## 2023-03-17 HISTORY — PX: CORONARY STENT INTERVENTION: CATH118234

## 2023-03-17 LAB — GLUCOSE, CAPILLARY
Glucose-Capillary: 154 mg/dL — ABNORMAL HIGH (ref 70–99)
Glucose-Capillary: 156 mg/dL — ABNORMAL HIGH (ref 70–99)
Glucose-Capillary: 127 mg/dL — ABNORMAL HIGH (ref 70–99)
Glucose-Capillary: 116 mg/dL — ABNORMAL HIGH (ref 70–99)

## 2023-03-17 LAB — CBC
HCT: 41.6 % (ref 36.0–46.0)
Hemoglobin: 14.4 g/dL (ref 12.0–15.0)
MCH: 29.6 pg (ref 26.0–34.0)
MCHC: 34.6 g/dL (ref 30.0–36.0)
MCV: 85.4 fL (ref 80.0–100.0)
Platelets: 256 10*3/uL (ref 150–400)
RBC: 4.87 MIL/uL (ref 3.87–5.11)
RDW: 13.2 % (ref 11.5–15.5)
WBC: 8.9 10*3/uL (ref 4.0–10.5)
nRBC: 0 % (ref 0.0–0.2)

## 2023-03-17 LAB — BASIC METABOLIC PANEL
Anion gap: 10 (ref 5–15)
BUN: 9 mg/dL (ref 6–20)
CO2: 22 mmol/L (ref 22–32)
Calcium: 10 mg/dL (ref 8.9–10.3)
Chloride: 102 mmol/L (ref 98–111)
Creatinine, Ser: 0.82 mg/dL (ref 0.44–1.00)
GFR, Estimated: >60 mL/min (ref >60)
Glucose, Bld: 161 mg/dL — ABNORMAL HIGH (ref 70–99)
Potassium: 4 mmol/L (ref 3.5–5.1)
Sodium: 134 mmol/L — ABNORMAL LOW (ref 135–145)

## 2023-03-17 LAB — HEPARIN LEVEL (UNFRACTIONATED): Heparin Unfractionated: 0.44 IU/mL (ref 0.30–0.70)

## 2023-03-17 LAB — POCT ACTIVATED CLOTTING TIME
Activated Clotting Time: 390 seconds
Activated Clotting Time: 217 seconds
Activated Clotting Time: 353 seconds

## 2023-03-17 SURGERY — CORONARY STENT INTERVENTION
Anesthesia: LOCAL

## 2023-03-17 MED ORDER — MIDAZOLAM HCL 2 MG/2ML IJ SOLN
INTRAMUSCULAR | Status: AC
  Filled 2023-03-17: qty 2

## 2023-03-17 MED ORDER — IOHEXOL 350 MG/ML SOLN
INTRAVENOUS | Status: DC | PRN
  Administered 2023-03-17: 90 mL via INTRA_ARTERIAL

## 2023-03-17 MED ORDER — SODIUM CHLORIDE 0.9 % WEIGHT BASED INFUSION: 1.0000 mL/kg/h | INTRAVENOUS | Status: DC

## 2023-03-17 MED ORDER — HYDROMORPHONE HCL 1 MG/ML IJ SOLN
INTRAMUSCULAR | Status: AC
  Filled 2023-03-17: qty 0.5

## 2023-03-17 MED ORDER — ONDANSETRON HCL 4 MG/2ML IJ SOLN
INTRAMUSCULAR | Status: DC | PRN
  Administered 2023-03-17: 4 mg via INTRAVENOUS

## 2023-03-17 MED ORDER — SODIUM CHLORIDE 0.9 % IV SOLN: 250.0000 mL | INTRAVENOUS | Status: DC | PRN

## 2023-03-17 MED ORDER — HEPARIN (PORCINE) IN NACL 1000-0.9 UT/500ML-% IV SOLN
INTRAVENOUS | Status: DC | PRN
  Administered 2023-03-17 (×2): 500 mL via INTRA_ARTERIAL

## 2023-03-17 MED ORDER — LIDOCAINE HCL (PF) 1 % IJ SOLN
INTRAMUSCULAR | Status: DC | PRN
  Administered 2023-03-17: 8 mL

## 2023-03-17 MED ORDER — ONDANSETRON HCL 4 MG/2ML IJ SOLN: 4.0000 mg | Freq: Four times a day (QID) | INTRAMUSCULAR | Status: DC | PRN

## 2023-03-17 MED ORDER — SODIUM CHLORIDE 0.9 % WEIGHT BASED INFUSION: 3.0000 mL/kg/h | INTRAVENOUS | Status: DC

## 2023-03-17 MED ORDER — SODIUM CHLORIDE 0.9 % IV SOLN: INTRAVENOUS | Status: AC

## 2023-03-17 MED ORDER — MIDAZOLAM HCL 2 MG/2ML IJ SOLN
INTRAMUSCULAR | Status: DC | PRN
  Administered 2023-03-17 (×2): 1 mg via INTRAVENOUS
  Administered 2023-03-17: 2 mg via INTRAVENOUS

## 2023-03-17 MED ORDER — LABETALOL HCL 5 MG/ML IV SOLN: 10.0000 mg | INTRAVENOUS | Status: AC | PRN

## 2023-03-17 MED ORDER — TICAGRELOR 90 MG PO TABS
ORAL_TABLET | ORAL | Status: AC
  Filled 2023-03-17: qty 2

## 2023-03-17 MED ORDER — TICAGRELOR 90 MG PO TABS
ORAL_TABLET | ORAL | Status: DC | PRN
  Administered 2023-03-17: 180 mg via ORAL

## 2023-03-17 MED ORDER — CLOPIDOGREL BISULFATE 75 MG PO TABS: 600.0000 mg | ORAL_TABLET | Freq: Once | ORAL | Status: DC

## 2023-03-17 MED ORDER — TICAGRELOR 90 MG PO TABS
90.0000 mg | ORAL_TABLET | Freq: Two times a day (BID) | ORAL | Status: DC
  Administered 2023-03-17 – 2023-03-18 (×2): 90 mg via ORAL
  Filled 2023-03-17 (×2): qty 1

## 2023-03-17 MED ORDER — LIDOCAINE HCL (PF) 1 % IJ SOLN
INTRAMUSCULAR | Status: AC
  Filled 2023-03-17: qty 30

## 2023-03-17 MED ORDER — NITROGLYCERIN 1 MG/10 ML FOR IR/CATH LAB
INTRA_ARTERIAL | Status: DC | PRN
  Administered 2023-03-17 (×3): 200 ug via INTRACORONARY

## 2023-03-17 MED ORDER — HYDROMORPHONE HCL 1 MG/ML IJ SOLN
INTRAMUSCULAR | Status: DC | PRN
  Administered 2023-03-17 (×2): .5 mg via INTRAVENOUS

## 2023-03-17 MED ORDER — METOPROLOL TARTRATE 12.5 MG HALF TABLET
12.5000 mg | ORAL_TABLET | Freq: Two times a day (BID) | ORAL | Status: DC
  Administered 2023-03-17 – 2023-03-18 (×3): 12.5 mg via ORAL
  Filled 2023-03-17 (×2): qty 1

## 2023-03-17 MED ORDER — TICAGRELOR 90 MG PO TABS: 180.0000 mg | ORAL_TABLET | Freq: Once | ORAL | Status: AC

## 2023-03-17 MED ORDER — NITROGLYCERIN 1 MG/10 ML FOR IR/CATH LAB
INTRA_ARTERIAL | Status: AC
  Filled 2023-03-17: qty 10

## 2023-03-17 MED ORDER — ASPIRIN 81 MG PO CHEW: 81.0000 mg | CHEWABLE_TABLET | Freq: Every day | ORAL | Status: DC

## 2023-03-17 MED ORDER — PROCHLORPERAZINE EDISYLATE 10 MG/2ML IJ SOLN
10.0000 mg | Freq: Four times a day (QID) | INTRAMUSCULAR | Status: DC | PRN
  Administered 2023-03-17: 10 mg via INTRAVENOUS
  Filled 2023-03-17: qty 2

## 2023-03-17 MED ORDER — SODIUM CHLORIDE 0.9% FLUSH: 3.0000 mL | INTRAVENOUS | Status: DC | PRN

## 2023-03-17 MED ORDER — SODIUM CHLORIDE 0.9% FLUSH: 3.0000 mL | Freq: Two times a day (BID) | INTRAVENOUS | Status: DC

## 2023-03-17 MED ORDER — ONDANSETRON HCL 4 MG/2ML IJ SOLN
INTRAMUSCULAR | Status: AC
  Filled 2023-03-17: qty 2

## 2023-03-17 MED ORDER — ACETAMINOPHEN 325 MG PO TABS: 650.0000 mg | ORAL_TABLET | ORAL | Status: DC | PRN

## 2023-03-17 MED ORDER — HEART ATTACK BOUNCING BOOK
Freq: Once | Status: AC
  Filled 2023-03-17: qty 1

## 2023-03-17 MED ORDER — HYDRALAZINE HCL 20 MG/ML IJ SOLN: 10.0000 mg | INTRAMUSCULAR | Status: AC | PRN

## 2023-03-17 MED ORDER — HEPARIN SODIUM (PORCINE) 1000 UNIT/ML IJ SOLN
INTRAMUSCULAR | Status: DC | PRN
  Administered 2023-03-17: 10000 [IU] via INTRAVENOUS

## 2023-03-17 SURGICAL SUPPLY — 30 items
BALL SAPPHIRE NC24 3.0X10 (BALLOONS) ×1
BALLN EMERGE MR 3.0X12 (BALLOONS) ×1
BALLN WOLVERINE 2.50X10 (BALLOONS) ×1
BALLN ~~LOC~~ EMERGE MR 3.75X12 (BALLOONS) ×1
BALLOON EMERGE MR 3.0X12 (BALLOONS) IMPLANT
BALLOON SAPPHIRE NC24 3.0X10 (BALLOONS) IMPLANT
BALLOON WOLVERINE 2.50X10 (BALLOONS) IMPLANT
BALLOON ~~LOC~~ EMERGE MR 3.75X12 (BALLOONS) IMPLANT
CATH LAUNCHER 6FR EBU3.5 (CATHETERS) IMPLANT
CATH LAUNCHER 6FR JR4 (CATHETERS) IMPLANT
CATH OPTICROSS HD (CATHETERS) IMPLANT
CLOSURE MYNX CONTROL 6F/7F (Vascular Products) IMPLANT
ELECT DEFIB PAD ADLT CADENCE (PAD) IMPLANT
KIT ENCORE 26 ADVANTAGE (KITS) IMPLANT
KIT HEART LEFT (KITS) ×1 IMPLANT
KIT HEMO VALVE WATCHDOG (MISCELLANEOUS) IMPLANT
KIT MICROPUNCTURE NIT STIFF (SHEATH) IMPLANT
PACK CARDIAC CATHETERIZATION (CUSTOM PROCEDURE TRAY) ×1 IMPLANT
SHEATH PINNACLE 6F 10CM (SHEATH) IMPLANT
SHEATH PROBE COVER 6X72 (BAG) IMPLANT
SLED PULL BACK IVUS (MISCELLANEOUS) IMPLANT
STENT SYNERGY XD 2.75X16 (Permanent Stent) IMPLANT
STENT SYNERGY XD 3.50X20 (Permanent Stent) IMPLANT
SYNERGY XD 2.75X16 (Permanent Stent) ×1 IMPLANT
SYNERGY XD 3.50X20 (Permanent Stent) ×1 IMPLANT
TRANSDUCER W/STOPCOCK (MISCELLANEOUS) ×1 IMPLANT
TUBING CIL FLEX 10 FLL-RA (TUBING) ×1 IMPLANT
WIRE ASAHI PROWATER 180CM (WIRE) IMPLANT
WIRE EMERALD 3MM-J .035X150CM (WIRE) IMPLANT
WIRE RUNTHROUGH .014X180CM (WIRE) IMPLANT

## 2023-03-17 NOTE — Plan of Care (Signed)
Problem: Education: Goal: Knowledge of General Education information will improve Description: Including pain rating scale, medication(s)/side effects and non-pharmacologic comfort measures Outcome: Progressing   Problem: Health Behavior/Discharge Planning: Goal: Ability to manage health-related needs will improve Outcome: Progressing   Problem: Clinical Measurements: Goal: Ability to maintain clinical measurements within normal limits will improve Outcome: Progressing Goal: Will remain free from infection Outcome: Progressing Goal: Diagnostic test results will improve Outcome: Progressing Goal: Respiratory complications will improve Outcome: Progressing Goal: Cardiovascular complication will be avoided Outcome: Progressing   Problem: Activity: Goal: Risk for activity intolerance will decrease Outcome: Progressing   Problem: Nutrition: Goal: Adequate nutrition will be maintained Outcome: Progressing   Problem: Coping: Goal: Level of anxiety will decrease Outcome: Progressing   Problem: Elimination: Goal: Will not experience complications related to bowel motility Outcome: Progressing Goal: Will not experience complications related to urinary retention Outcome: Progressing   Problem: Pain Managment: Goal: General experience of comfort will improve Outcome: Progressing   Problem: Safety: Goal: Ability to remain free from injury will improve Outcome: Progressing   Problem: Skin Integrity: Goal: Risk for impaired skin integrity will decrease Outcome: Progressing   Problem: Education: Goal: Ability to describe self-care measures that may prevent or decrease complications (Diabetes Survival Skills Education) will improve Outcome: Progressing Goal: Individualized Educational Video(s) Outcome: Progressing   Problem: Coping: Goal: Ability to adjust to condition or change in health will improve Outcome: Progressing   Problem: Fluid Volume: Goal: Ability to  maintain a balanced intake and output will improve Outcome: Progressing   Problem: Health Behavior/Discharge Planning: Goal: Ability to identify and utilize available resources and services will improve Outcome: Progressing Goal: Ability to manage health-related needs will improve Outcome: Progressing   Problem: Metabolic: Goal: Ability to maintain appropriate glucose levels will improve Outcome: Progressing   Problem: Nutritional: Goal: Maintenance of adequate nutrition will improve Outcome: Progressing Goal: Progress toward achieving an optimal weight will improve Outcome: Progressing   Problem: Skin Integrity: Goal: Risk for impaired skin integrity will decrease Outcome: Progressing   Problem: Tissue Perfusion: Goal: Adequacy of tissue perfusion will improve Outcome: Progressing   Problem: Education: Goal: Understanding of cardiac disease, CV risk reduction, and recovery process will improve Outcome: Progressing Goal: Individualized Educational Video(s) Outcome: Progressing   Problem: Activity: Goal: Ability to tolerate increased activity will improve Outcome: Progressing   Problem: Cardiac: Goal: Ability to achieve and maintain adequate cardiovascular perfusion will improve Outcome: Progressing   Problem: Health Behavior/Discharge Planning: Goal: Ability to safely manage health-related needs after discharge will improve Outcome: Progressing   Problem: Education: Goal: Understanding of CV disease, CV risk reduction, and recovery process will improve Outcome: Progressing Goal: Individualized Educational Video(s) Outcome: Progressing   Problem: Activity: Goal: Ability to return to baseline activity level will improve Outcome: Progressing   Problem: Cardiovascular: Goal: Ability to achieve and maintain adequate cardiovascular perfusion will improve Outcome: Progressing Goal: Vascular access site(s) Level 0-1 will be maintained Outcome: Progressing    Problem: Health Behavior/Discharge Planning: Goal: Ability to safely manage health-related needs after discharge will improve Outcome: Progressing   Problem: Education: Goal: Understanding of CV disease, CV risk reduction, and recovery process will improve Outcome: Progressing Goal: Individualized Educational Video(s) Outcome: Progressing   Problem: Activity: Goal: Ability to return to baseline activity level will improve Outcome: Progressing   Problem: Cardiovascular: Goal: Ability to achieve and maintain adequate cardiovascular perfusion will improve Outcome: Progressing Goal: Vascular access site(s) Level 0-1 will be maintained Outcome: Progressing   Problem: Health Behavior/Discharge Planning: Goal:   Ability to safely manage health-related needs after discharge will improve Outcome: Progressing   

## 2023-03-17 NOTE — Progress Notes (Signed)
PROGRESS NOTE Angela Knight  ZOX:[id number] DOB: 1970/11/29 DOA: 03/14/2023 PCP: Delorse Lek, MD  Brief Narrative/Hospital Course: 52 y.o. female with medical history significant for hypertension, hyperlipidemia, and type 2 diabetes mellitus presented for evaluation of discomfort in her shoulder and chest, as well as torsional dyspnea. Patient reports developing an ache involving bilateral shoulders and a heavy sensation in her chest beginning the night of 03/12/2023.  The following day, she became profoundly fatigued and dyspneic while walking down the hall at work.  This was very unusual for her, she felt better after resting for several minutes, but has had recurrence in the symptoms with exertion. [facility] where: Hemodynamically stable afebrile not hypoxic EKG with inferior T wave inversions CTA chest negative for PE troponin elevated 57 to 80 to 94. Cardiology was consulted suspected to have NSTEMI given aspirin 324 mg placed on heparin and admitted to [facility] with risk factors of DM and unstable anginal symptoms with exertion PTA/ S/P LHC 5/22: Prox-mid LAD disease and D1 bifurcation; mid RCA disease.     Subjective:  Pt seen after cath today.  She has refractory nausea despite Zofran given in cath lab.  She reports some bilaterally upper arm / shoulder pain. Otherwise no acute complaints.  No chest pain.  Assessment and Plan: Principal Problem:   NSTEMI (non-ST elevated myocardial infarction) [facility]) Active Problems:   Type 2 diabetes mellitus (HCC)   Hypertension   Hyperlipidemia    NSTEMI:With chest pain, elevated troponin  w/ risk factors of diabetes hypertension obesity hyperlipidemia,Echo 5/22- lvef normal no WMA. S/P LHC 5/22: Prox-mid LAD disease and D1 bifurcation; mid RCA disease.  --Mgmt per Cardiology, CT surgery --Underwent left heart cath today, stents placed to mid RCA, mid LAD.  See cath report. --Cont asa, lipitor, losartan  Type II DM  w/ A1c improving7.3%> 6.8.stable,continue SSI   Hypertension BP stable on losartan.  Hyperlipidemia continue high intensity statin  Mild hyponatremia monitor BMP  Class I Obesity:Patient's Body mass index is 32.83 kg/m. : Complicates overall care and prognosis.  Recommend lifestyle modifications including physical activity and diet for weight loss and overall long-term health. --PCP follow up  DVT prophylaxis: SCD's Start: 03/15/23 1754heaprin gtt  Code Status:   Code Status: Full Code  Family Communication: multiple family members at bedside on rounds  Patient status is:  inpatient  because of NSTEMI Level of care: Telemetry Cardiac   Dispo: The patient is from: HOME            Anticipated disposition: TBD  Objective: Vitals last 24 hrs: Vitals:   03/15/23 1715 03/15/23 1731 03/15/23 2100 03/16/23 0500  BP: (!) 153/83  137/69 125/65  Pulse: 65 68 63 (!) 55  Resp: 19 18 15 17   Temp:   98 F (36.7 C) 97.9 F (36.6 C)  TempSrc:   Oral Oral  SpO2: 100% 99% 99% 98%  Weight:    92.3 kg  Height:       Weight change: 1.542 kg  Physical Examination:  General exam: awake, alert, no acute distress, mildly ill appearing with nausea HEENT: keeps eyes closed, moist mucus membranes, hearing grossly normal  Respiratory system: CTAB, no wheezes, rales or rhonchi, normal respiratory effort. Cardiovascular system: normal S1/S2, RRR, no JVD, murmurs, rubs, gallops,  no pedal edema.   Gastrointestinal system: soft, NT, ND, no HSM felt, +bowel sounds. Central nervous system: A&O x3. no gross focal neurologic deficits, normal speech Extremities: moves all,  no edema, normal tone Skin: dry, intact, normal temperature Psychiatry: normal mood, congruent affect, judgement and insight appear normal   Medications reviewed:  Scheduled Meds:  aspirin  81 mg Oral Daily   aspirin EC  81 mg Oral Daily   atorvastatin  80 mg Oral QHS   insulin aspart  0-6 Units Subcutaneous TID WC   losartan   12.5 mg Oral Daily   metoprolol tartrate  25 mg Oral BID   pantoprazole  40 mg Oral Daily   sodium chloride flush  3 mL Intravenous Q12H  Continuous Infusions:  sodium chloride 100 mL/hr at 03/15/23 1810   sodium chloride     heparin 1,300 Units/hr (03/16/23 0605)    Diet Order             Diet Heart Room service appropriate? Yes; Fluid consistency: Thin  Diet effective now                  Intake/Output Summary (Last 24 hours) at 03/16/2023 0948 Last data filed at 03/16/2023 0616 Gross per 24 hour  Intake 1748.61 ml  Output --  Net 1748.61 ml    Net IO Since Admission: 2,339.92 mL [03/16/23 0948]  Wt Readings from Last 3 Encounters:  03/16/23 92.3 kg  11/04/21 93 kg  05/09/17 91.2 kg     Unresulted Labs (From admission, onward)     Start     Ordered   03/16/23 1300  Heparin level (unfractionated)  Once-Timed,   TIMED       Question:  Specimen collection method  Answer:  Lab=Lab collect   03/16/23 0442   03/16/23 0500  Heparin level (unfractionated)  Daily,   R      03/14/23 2236   03/15/23 0904  Lipoprotein A (LPA)  Once,   R       Comments: Please draw with next heparin labs   Question:  Specimen collection method  Answer:  Lab=Lab collect   03/15/23 0903   03/15/23 0500  CBC  Daily,   R      03/14/23 2236   03/15/23 0500  Lipoprotein A (LPA)  Tomorrow morning,   R        03/15/23 0031   03/15/23 0500  Basic metabolic panel  Daily,   R      03/15/23 0031          Data Reviewed: I have personally reviewed following labs and imaging studies CBC: Recent Labs  Lab 03/14/23 1511 03/15/23 0213 03/16/23 0313  WBC 9.3 8.9 7.3  NEUTROABS 4.8  --   --   HGB 14.1 13.7 14.0  HCT 40.5 38.9 40.5  MCV 86.4 85.1 85.8  PLT 317 291 290    Basic Metabolic Panel: Recent Labs  Lab 03/14/23 1511 03/15/23 0213 03/16/23 0313  NA 137 132* 135  K 3.8 3.7 4.4  CL 104 101 105  CO2 23 21* 22  GLUCOSE 109* 186* 136*  BUN 11 8 10   CREATININE 0.70 0.73 0.82   CALCIUM 10.1 9.5 9.7  MG  --  2.0  --     GFR: Estimated Creatinine Clearance: 91.9 mL/min (by C-G formula based on SCr of 0.82 mg/dL). Liver Function Tests: Recent Labs  Lab 03/14/23 1511  AST 23  ALT 31  ALKPHOS 44  BILITOT 0.4  PROT 7.2  ALBUMIN 4.9    Recent Labs  Lab 03/14/23 1511  LIPASE <10*    HbA1C: Recent Labs    03/15/23 0217  HGBA1C 6.8*    CBG: Recent Labs  Lab 03/15/23 0805 03/15/23 1202 03/15/23 1803 03/15/23 2126 03/16/23 0851  GLUCAP 137* 104* 105* 162* 179*   No results found for this or any previous visit (from the past 240 hour(s)).  Antimicrobials: Anti-infectives (From admission, onward)    None      Culture/Microbiology No results found for: "SDES", "SPECREQUEST", "CULT", "REPTSTATUS"  Radiology Studies: CARDIAC CATHETERIZATION  Result Date: 03/15/2023   Mid LAD-1 lesion is 90% stenosed.   2nd Diag lesion is 35% stenosed.   Prox RCA lesion is 95% stenosed.   Mid LAD-2 lesion is 20% stenosed.   Non-stenotic Prox RCA to Mid RCA lesion.   The left ventricular systolic function is normal.   LV end diastolic pressure is mildly elevated.   The left ventricular ejection fraction is 55-65% by visual estimate. 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 Normal left circumflex coronary artery. Normal global LV function with EF estimate approximately 50 - 55%.  LVEDP mildly elevated at 21 mmHg. RECOMMENDATION: Consider surgical consultation in light of complex large LAD/diagonal bifurcation stenosis and high-grade large dominant RCA.  Will have colleagues review.  Will reinitiate heparin several hours post TR band removal.   ECHOCARDIOGRAM COMPLETE  Result Date: 03/15/2023    ECHOCARDIOGRAM REPORT   Patient Name:   DEDRICK KOTAS  Date of Exam: 03/15/2023 Medical Rec #:  [id number]     Height:       66.0 in Accession #:    [id number]    Weight:       205.7 lb Date of Birth:  01/18/1971     BSA:          2.024 m Patient Age:    51 years      BP:           130/79 mmHg Patient Gender: F             HR:           59 bpm. Exam Location:  Inpatient Procedure: 2D Echo, Color Doppler and Cardiac Doppler Indications:    NSTEMI  History:        Patient has no prior history of Echocardiogram examinations.                 Risk Factors:Hypertension, Diabetes and Dyslipidemia.  Sonographer:    Irving Burton Senior RDCS Referring Phys: [id number] TIMOTHY S OPYD IMPRESSIONS  1. Left ventricular ejection fraction, by estimation, is 65 to 70%. The left ventricle has normal function. The left ventricle has no regional wall motion abnormalities. There is mild concentric left ventricular hypertrophy. Left ventricular diastolic parameters were normal.  2. Right ventricular systolic function is normal. The right ventricular size is mildly enlarged. Tricuspid regurgitation signal is inadequate for assessing PA pressure.  3. The mitral valve is normal in structure. Trivial mitral valve regurgitation.  4. The aortic valve is tricuspid. Aortic valve regurgitation is not visualized.  5. The inferior vena cava is normal in size with greater than 50% respiratory variability, suggesting right atrial pressure of 3 mmHg. FINDINGS  Left Ventricle: Left ventricular ejection fraction, by estimation, is 65 to 70%. The left ventricle has normal function. The left ventricle has no regional wall motion abnormalities. The left ventricular internal cavity size was normal  in size. There is  mild concentric left ventricular hypertrophy. Left ventricular diastolic parameters were normal. Right Ventricle: The right ventricular size is mildly enlarged. No increase in right ventricular wall thickness. Right ventricular systolic function is normal. Tricuspid regurgitation signal is inadequate for  assessing PA pressure. Left Atrium: Left atrial size was normal in size. Right Atrium: Right atrial size was normal in size. Pericardium: Trivial pericardial effusion is present. Mitral Valve: The mitral valve is normal in structure. Trivial mitral valve regurgitation. Tricuspid Valve: The tricuspid valve is normal in structure. Tricuspid valve regurgitation is trivial. Aortic Valve: The aortic valve is tricuspid. Aortic valve regurgitation is not visualized. Pulmonic Valve: The pulmonic valve was normal in structure. Pulmonic valve regurgitation is trivial. Aorta: The aortic root and ascending aorta are structurally normal, with no evidence of dilitation. Venous: The inferior vena cava is normal in size with greater than 50% respiratory variability, suggesting right atrial pressure of 3 mmHg. IAS/Shunts: No atrial level shunt detected by color flow Doppler.  LEFT VENTRICLE PLAX 2D LVIDd:         4.80 cm   Diastology LVIDs:         2.60 cm   LV e' medial:    9.95 cm/s LV PW:         1.20 cm   LV E/e' medial:  8.0 LV IVS:        1.00 cm   LV e' lateral:   14.00 cm/s LVOT diam:     1.70 cm   LV E/e' lateral: 5.7 LV SV:         47 LV SV Index:   23 LVOT Area:     2.27 cm  RIGHT VENTRICLE RV S prime:     8.39 cm/s TAPSE (M-mode): 2.2 cm LEFT ATRIUM             Index        RIGHT ATRIUM           Index LA diam:        2.20 cm 1.09 cm/m   RA Area:     19.30 cm LA Vol (A2C):   36.9 ml 18.23 ml/m  RA Volume:   54.70 ml  27.03 ml/m LA Vol (A4C):   35.2 ml 17.39 ml/m LA Biplane Vol: 39.2 ml 19.37 ml/m  AORTIC VALVE LVOT Vmax:   99.40 cm/s LVOT Vmean:  69.400 cm/s LVOT VTI:    0.205 m  AORTA Ao Root diam: 3.10 cm Ao Asc diam:  2.80 cm MITRAL VALVE MV Area (PHT): 2.96 cm    SHUNTS MV Decel Time: 256 msec    Systemic VTI:  0.20 m MV E velocity: 79.20 cm/s  Systemic Diam: 1.70 cm MV A velocity: 69.70 cm/s MV E/A ratio:  1.14 Donato Schultz MD Electronically signed by Donato Schultz MD Signature Date/Time: 03/15/2023/11:04:05 AM     Final    CT Angio Chest PE W and/or Wo Contrast  Result Date: 03/14/2023 CLINICAL DATA:  Bilateral shoulder and neck pain with heaviness in chest. EXAM: CT ANGIOGRAPHY CHEST WITH CONTRAST TECHNIQUE: Multidetector CT imaging of the chest was performed using the standard protocol during bolus administration of intravenous contrast. Multiplanar CT image reconstructions and MIPs were obtained to evaluate the vascular anatomy. RADIATION DOSE REDUCTION: This exam was performed according to the departmental dose-optimization program which includes automated exposure control, adjustment of the mA and/or kV according to patient size and/or use of iterative reconstruction technique. CONTRAST:  75mL OMNIPAQUE IOHEXOL  350 MG/ML SOLN COMPARISON:  Chest x-ray earlier 03/14/2023 FINDINGS: Cardiovascular: Heart is nonenlarged. No pericardial effusion. The thoracic aorta has a normal course and caliber minimal atherosclerotic change. No segmental or larger pulmonary embolism identified. Mediastinum/Nodes: No enlarged mediastinal, hilar, or axillary lymph nodes. Thyroid gland, trachea, and esophagus demonstrate no significant findings. Small hiatal hernia. Lungs/Pleura: There is some mild breathing motion identified. No consolidation, pneumothorax or effusion. Azygous fissure. Upper Abdomen: No acute abnormality.  Fatty liver infiltration. Musculoskeletal: Mild curvature and degenerative changes along the spine. Review of the MIP images confirms the above findings. IMPRESSION: No pulmonary embolism identified. Small hiatal hernia. Aortic Atherosclerosis (ICD10-I70.0). Electronically Signed   By: Karen Kays M.D.   On: 03/14/2023 17:39   DG Chest Portable 1 View  Result Date: 03/14/2023 CLINICAL DATA:  Neck and bilateral shoulder pain with chest heaviness for 2 days. EXAM: PORTABLE CHEST 1 VIEW COMPARISON:  None Available. FINDINGS: The heart size and mediastinal contours are normal. The lungs are clear. There is no pleural  effusion or pneumothorax. No acute osseous findings are identified. There are degenerative changes in the thoracic spine. There are calcifications lateral to the left humeral head which could reflect hydroxyapatite deposition. IMPRESSION: No evidence of acute cardiopulmonary process. Possible hydroxyapatite deposition adjacent to the left humeral head. Electronically Signed   By: Carey Bullocks M.D.   On: 03/14/2023 15:30     LOS: 2 days   Esaw Grandchild, DO [facility]  03/16/2023, 9:48 AM

## 2023-03-17 NOTE — Progress Notes (Addendum)
Rounding Note    Patient Name: Angela Knight Date of Encounter: 03/17/2023  [facility] Cardiologist: None   Subjective   Patient reports improving arm pain. Discussed plans for PCI with Dr. Eldridge Dace today. She is excited about avoiding surgery but is anxious about having recurrent arm pain during the case. Denies chest pain or shortness of breath.   Inpatient Medications    Scheduled Meds:  aspirin  81 mg Oral Daily   aspirin EC  81 mg Oral Daily   atorvastatin  80 mg Oral QHS   insulin aspart  0-6 Units Subcutaneous TID WC   losartan  12.5 mg Oral Daily   metoprolol tartrate  25 mg Oral BID   pantoprazole  40 mg Oral Daily   polyethylene glycol  34 g Oral Once   sodium chloride flush  3 mL Intravenous Q12H   Continuous Infusions:  sodium chloride 100 mL/hr at 03/15/23 1810   sodium chloride     heparin 1,650 Units/hr (03/16/23 2325)   PRN Meds: sodium chloride, acetaminophen, diazepam, nitroGLYCERIN, ondansetron (ZOFRAN) IV, mouth rinse, oxyCODONE, sodium chloride flush   Vital Signs    Vitals:   03/16/23 2100 03/16/23 2200 03/17/23 0220 03/17/23 0554  BP:  121/63  124/67  Pulse: (!) 55 (!) 52  (!) 54  Resp: 20 18  16   Temp:    98.3 F (36.8 C)  TempSrc:    Oral  SpO2: 95% 97%  98%  Weight:   91.5 kg   Height:        Intake/Output Summary (Last 24 hours) at 03/17/2023 0806 Last data filed at 03/16/2023 2206 Gross per 24 hour  Intake 384.84 ml  Output --  Net 384.84 ml      03/17/2023    2:20 AM 03/16/2023    5:00 AM 03/15/2023    3:55 AM  Last 3 Weights  Weight (lbs) 201 lb 11.5 oz 203 lb 6.4 oz 205 lb 11.2 oz  Weight (kg) 91.5 kg 92.262 kg 93.305 kg      Telemetry    Sinus rhythm, sinus bradycardia - Personally Reviewed  ECG    No new tracing - Personally Reviewed  Physical Exam   GEN: No acute distress.   Neck: No JVD Cardiac: RRR, no murmurs, rubs, or gallops.  Respiratory: Clear to auscultation bilaterally. GI: Soft,  nontender, non-distended  MS: No edema; No deformity. Neuro:  Nonfocal  Psych: Normal affect   Labs    High Sensitivity Troponin:   Recent Labs  Lab 03/14/23 1728 03/14/23 1929 03/14/23 2236 03/15/23 0218 03/15/23 0649  TROPONINIHS 80* 94* 122* 211* 463*     Chemistry Recent Labs  Lab 03/14/23 1511 03/15/23 0213 03/16/23 0313 03/17/23 0629  NA 137 132* 135 134*  K 3.8 3.7 4.4 4.0  CL 104 101 105 102  CO2 23 21* 22 22  GLUCOSE 109* 186* 136* 161*  BUN 11 8 10 9   CREATININE 0.70 0.73 0.82 0.82  CALCIUM 10.1 9.5 9.7 10.0  MG  --  2.0  --   --   PROT 7.2  --   --   --   ALBUMIN 4.9  --   --   --   AST 23  --   --   --   ALT 31  --   --   --   ALKPHOS 44  --   --   --   BILITOT 0.4  --   --   --  GFRNONAA >60 >60 >60 >60  ANIONGAP 10 10 8 10     Lipids  Recent Labs  Lab 03/15/23 1208  CHOL 170  TRIG 200*  HDL 43  LDLCALC 87  CHOLHDL 4.0    Hematology Recent Labs  Lab 03/15/23 0213 03/16/23 0313 03/17/23 0629  WBC 8.9 7.3 8.9  RBC 4.57 4.72 4.87  HGB 13.7 14.0 14.4  HCT 38.9 40.5 41.6  MCV 85.1 85.8 85.4  MCH 30.0 29.7 29.6  MCHC 35.2 34.6 34.6  RDW 13.4 13.3 13.2  PLT 291 290 256   Thyroid No results for input(s): "TSH", "FREET4" in the last 168 hours.  BNPNo results for input(s): "BNP", "PROBNP" in the last 168 hours.  DDimer No results for input(s): "DDIMER" in the last 168 hours.   Radiology    CARDIAC CATHETERIZATION  Result Date: 03/15/2023   Mid LAD-1 lesion is 90% stenosed.   2nd Diag lesion is 35% stenosed.   Prox RCA lesion is 95% stenosed.   Mid LAD-2 lesion is 20% stenosed.   Non-stenotic Prox RCA to Mid RCA lesion.   The left ventricular systolic function is normal.   LV end diastolic pressure is mildly elevated.   The left ventricular ejection fraction is 55-65% by visual estimate. 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 Normal left circumflex coronary artery. Normal global LV function with EF estimate approximately 50 - 55%.  LVEDP mildly elevated at 21 mmHg. RECOMMENDATION: Consider surgical consultation in light of complex large LAD/diagonal bifurcation stenosis and high-grade large dominant RCA.  Will have colleagues review.  Will reinitiate heparin several hours post TR band removal.   ECHOCARDIOGRAM COMPLETE  Result Date: 03/15/2023    ECHOCARDIOGRAM REPORT   Patient Name:   Angela Knight Date of Exam: 03/15/2023 Medical Rec #:  [id number]     Height:       66.0 in Accession #:    [id number]    Weight:       205.7 lb Date of Birth:  1971-10-10     BSA:          2.024 m Patient Age:    51 years      BP:           130/79 mmHg Patient Gender: F             HR:           59 bpm. Exam Location:  Inpatient Procedure: 2D Echo, Color Doppler and Cardiac Doppler Indications:    NSTEMI  History:        Patient has no prior history of Echocardiogram examinations.                 Risk Factors:Hypertension, Diabetes and Dyslipidemia.  Sonographer:    Irving Burton Senior RDCS Referring Phys: [id number] TIMOTHY S OPYD IMPRESSIONS  1. Left ventricular ejection fraction, by estimation, is 65 to 70%. The left ventricle has normal function. The left ventricle has no regional wall motion abnormalities. There is mild concentric left ventricular hypertrophy. Left ventricular diastolic parameters were normal.  2. Right ventricular systolic function is normal. The right ventricular size is mildly enlarged. Tricuspid regurgitation signal is inadequate for assessing PA pressure.  3. The mitral valve is normal in structure. Trivial mitral valve regurgitation.  4. The  aortic valve is tricuspid. Aortic valve regurgitation is not visualized.  5. The inferior vena cava is normal in size with greater than 50% respiratory variability, suggesting right  atrial pressure of 3 mmHg. FINDINGS  Left Ventricle: Left ventricular ejection fraction, by estimation, is 65 to 70%. The left ventricle has normal function. The left ventricle has no regional wall motion abnormalities. The left ventricular internal cavity size was normal in size. There is  mild concentric left ventricular hypertrophy. Left ventricular diastolic parameters were normal. Right Ventricle: The right ventricular size is mildly enlarged. No increase in right ventricular wall thickness. Right ventricular systolic function is normal. Tricuspid regurgitation signal is inadequate for assessing PA pressure. Left Atrium: Left atrial size was normal in size. Right Atrium: Right atrial size was normal in size. Pericardium: Trivial pericardial effusion is present. Mitral Valve: The mitral valve is normal in structure. Trivial mitral valve regurgitation. Tricuspid Valve: The tricuspid valve is normal in structure. Tricuspid valve regurgitation is trivial. Aortic Valve: The aortic valve is tricuspid. Aortic valve regurgitation is not visualized. Pulmonic Valve: The pulmonic valve was normal in structure. Pulmonic valve regurgitation is trivial. Aorta: The aortic root and ascending aorta are structurally normal, with no evidence of dilitation. Venous: The inferior vena cava is normal in size with greater than 50% respiratory variability, suggesting right atrial pressure of 3 mmHg. IAS/Shunts: No atrial level shunt detected by color flow Doppler.  LEFT VENTRICLE PLAX 2D LVIDd:         4.80 cm   Diastology LVIDs:         2.60 cm   LV e' medial:    9.95 cm/s LV PW:         1.20 cm   LV E/e' medial:  8.0 LV IVS:        1.00 cm   LV e' lateral:   14.00 cm/s LVOT diam:     1.70 cm   LV E/e' lateral: 5.7 LV SV:         47 LV SV Index:   23 LVOT Area:     2.27 cm  RIGHT VENTRICLE RV S prime:     8.39 cm/s TAPSE (M-mode): 2.2 cm LEFT ATRIUM             Index        RIGHT ATRIUM           Index LA diam:        2.20 cm 1.09  cm/m   RA Area:     19.30 cm LA Vol (A2C):   36.9 ml 18.23 ml/m  RA Volume:   54.70 ml  27.03 ml/m LA Vol (A4C):   35.2 ml 17.39 ml/m LA Biplane Vol: 39.2 ml 19.37 ml/m  AORTIC VALVE LVOT Vmax:   99.40 cm/s LVOT Vmean:  69.400 cm/s LVOT VTI:    0.205 m  AORTA Ao Root diam: 3.10 cm Ao Asc diam:  2.80 cm MITRAL VALVE MV Area (PHT): 2.96 cm    SHUNTS MV Decel Time: 256 msec    Systemic VTI:  0.20 m MV E velocity: 79.20 cm/s  Systemic Diam: 1.70 cm MV A velocity: 69.70 cm/s MV E/A ratio:  1.14 Donato Schultz MD Electronically signed by Donato Schultz MD Signature Date/Time: 03/15/2023/11:04:05 AM    Final     Cardiac Studies   03/15/2023 TTE  IMPRESSIONS     1. Left ventricular ejection fraction, by estimation, is 65 to 70%. The  left ventricle has normal function.  The left ventricle has no regional  wall motion abnormalities. There is mild concentric left ventricular  hypertrophy. Left ventricular diastolic  parameters were normal.   2. Right ventricular systolic function is normal. The right ventricular  size is mildly enlarged. Tricuspid regurgitation signal is inadequate for  assessing PA pressure.   3. The mitral valve is normal in structure. Trivial mitral valve  regurgitation.   4. The aortic valve is tricuspid. Aortic valve regurgitation is not  visualized.   5. The inferior vena cava is normal in size with greater than 50%  respiratory variability, suggesting right atrial pressure of 3 mmHg.   FINDINGS   Left Ventricle: Left ventricular ejection fraction, by estimation, is 65  to 70%. The left ventricle has normal function. The left ventricle has no  regional wall motion abnormalities. The left ventricular internal cavity  size was normal in size. There is   mild concentric left ventricular hypertrophy. Left ventricular diastolic  parameters were normal.   Right Ventricle: The right ventricular size is mildly enlarged. No  increase in right ventricular wall thickness. Right  ventricular systolic  function is normal. Tricuspid regurgitation signal is inadequate for  assessing PA pressure.   Left Atrium: Left atrial size was normal in size.   Right Atrium: Right atrial size was normal in size.   Pericardium: Trivial pericardial effusion is present.   Mitral Valve: The mitral valve is normal in structure. Trivial mitral  valve regurgitation.   Tricuspid Valve: The tricuspid valve is normal in structure. Tricuspid  valve regurgitation is trivial.   Aortic Valve: The aortic valve is tricuspid. Aortic valve regurgitation is  not visualized.   Pulmonic Valve: The pulmonic valve was normal in structure. Pulmonic valve  regurgitation is trivial.   Aorta: The aortic root and ascending aorta are structurally normal, with  no evidence of dilitation.   Venous: The inferior vena cava is normal in size with greater than 50%  respiratory variability, suggesting right atrial pressure of 3 mmHg.   IAS/Shunts: No atrial level shunt detected by color flow Doppler.   03/15/23 LHC    Mid LAD-1 lesion is 90% stenosed.   2nd Diag lesion is 35% stenosed.   Prox RCA lesion is 95% stenosed.   Mid LAD-2 lesion is 20% stenosed.   Non-stenotic Prox RCA to Mid RCA lesion.   The left ventricular systolic function is normal.   LV end diastolic pressure is mildly elevated.   The left ventricular ejection fraction is 55-65% by visual estimate.   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   Normal left circumflex coronary artery.   Normal global LV function with EF estimate approximately 50 - 55%.  LVEDP mildly elevated at 21 mmHg.   RECOMMENDATION: Consider surgical consultation in light of complex large LAD/diagonal bifurcation stenosis and high-grade  large dominant RCA.  Will have colleagues review.  Will reinitiate heparin several hours post TR band removal.  Diagnostic Dominance: Right   Patient Profile     Angela Knight is a 52 y.o. female with a hx of HTN, type 2 DM and HLD, now admitted with NSTEMI.  Assessment & Plan    NSTEMI CAD  Patient presented to the ED with chest and shoulder discomfort along with exertional dyspnea. She was found  with inferior TWI and troponin trend suggested NSTEMI: 57->80->94. Patient was loaded on ASA and placed on Heparin. LHC found prox-mid LAD with 90% stenosis, mid RCA disease 95% stenosed.   Patient chest pain free this morning. Plan for PCI today with Dr. Eldridge Dace after Heart team meeting this morning. Continue heparin, ASA 81mg . Post PCI DAPT per interventional provider. Will decrease Metoprolol to 12.5mg  BID given bradycardia Continue Losartan 12.5mg  QD (renal protection with DM).   Hyperlipidemia  Atorvastatin now increased to 80mg . LDL goal <55.  Lab Results  Component Value Date   CHOL 170 03/15/2023   HDL 43 03/15/2023   LDLCALC 87 03/15/2023   TRIG 200 (H) 03/15/2023   CHOLHDL 4.0 03/15/2023    Per primary team:  DM       For questions or updates, please contact [facility] Please consult [email or web address] for contact info under        Signed, Perlie Gold, PA-C  03/17/2023, 8:06 AM    Personally seen and examined. Agree with APP above with the following comments: Discussed in heart team meeting- planned for PCI. Did well with PCI, she had nausea and vomiting after intervention. She is tired but chest pain free.  No hematoma.  Still finishing bedrest.  After bed rest reviewed post cath activity: Avoid strenuous activity or heavy lifting for the first few days. Light activity like walking is generally allowed, but take it easy. Do not bend, lift, or cross the leg that was used for the catheter insertion. Do not drive for at least 24 hours after the  procedure. Avoid activities that may strain the puncture site, such as climbing stairs excessively or doing yard work. Limiting strenuous activity for up to 5 days after the procedure to allow the puncture site to properly heal.  Likely home tomorrow on DAPT.  Cardiac rehab as outpatient.  Riley Lam, MD FASE [facility] Cardiologist [facility]  [address] [place], [address] [place], [place] [address] [phone]  1:53 PM

## 2023-03-17 NOTE — Interval H&P Note (Signed)
Cath Lab Visit (complete for each Cath Lab visit)  Clinical Evaluation Leading to the Procedure:   ACS: Yes.    Non-ACS:    Anginal Classification: CCS IV  Anti-ischemic medical therapy: Minimal Therapy (1 class of medications)  Non-Invasive Test Results: No non-invasive testing performed  Prior CABG: No previous CABG  Discussed at heart team, PCI is preferred treatment    History and Physical Interval Note:  03/17/2023 9:54 AM  Zannie Cove  has presented today for surgery, with the diagnosis of chest pain.  The various methods of treatment have been discussed with the patient and family. After consideration of risks, benefits and other options for treatment, the patient has consented to  Procedure(s): LEFT HEART CATH AND CORONARY ANGIOGRAPHY (N/A) as a surgical intervention.  The patient's history has been reviewed, patient examined, no change in status, stable for surgery.  I have reviewed the patient's chart and labs.  Questions were answered to the patient's satisfaction.     Lance Muss

## 2023-03-17 NOTE — TOC Benefit Eligibility Note (Signed)
Patient Advocate Encounter  Insurance verification completed.    The patient is currently admitted and upon discharge could be taking Brilinta 90 mg.  The current 30 day co-pay is $30.00.   The patient is insured through [employer]   This test claim was processed through  Outpatient Pharmacy- copay amounts may vary at other pharmacies due to pharmacy/plan contracts, or as the patient moves through the different stages of their insurance plan.  Detra Bores, CPHT Pharmacy Patient Advocate Specialist [facility] Patient Advocate Team Direct Number: [phone]  Fax: [phone]       

## 2023-03-17 NOTE — Heart Team MDD (Signed)
   Heart Team Multi-Disciplinary Discussion  Patient: Angela Knight  DOB: 01-02-1971  MRN: [id number]   Date: 03/17/2023  10:07 AM    Attendees: Interventional Cardiology: Tonny Bollman, MD Bryan Lemma, MD Nanetta Batty, MD Alverda Skeans, MD Yvonne Kendall, MD Truett Mainland, MD Everette Rank, MD Yates Decamp, MD  Cardiothoracic Surgery: Brynda Greathouse, MD     Patient History: The patient was recently admitted for chest and shoulder discomfort along with exertional dyspnea.    Risk Factors: Diabetes Mellitus Hypertension Hyperlipidemia     Review of Prior Angiography and PCI Procedures: The Echo from 03/15/23 and the LHC from 03/15/23 were reviewed and discussed in detail with consideration given to severe multivessel CAD with complex LAD/diagonal bifurcation stenosis in a very large LAD and large diagonal vessel both extending to the LV apex. 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       Discussion: After presentation, consideration of treatment options occurred, contrasting possible surgical intervention with CABG versus PCI. Consensus from the group was for two-vessel PCI.    Recommendations: PCI      Alison Murray, RN  03/17/2023 10:07 AM

## 2023-03-17 NOTE — Progress Notes (Signed)
CARDIAC REHAB PHASE I    Post Mi/ stent education including site care, restrictions, risk factors, MI booklet, antiplatelet therapy importance, NTG use, exercise guidelines, heart healthy diabetic diet and CRP2 reviewed. All questions and concerns addressed. Will refer to [facility] for CRP2. Will continue to follow.  [id number]  Woodroe Chen, RN BSN 03/17/2023 2:18 PM

## 2023-03-17 NOTE — Progress Notes (Signed)
ANTICOAGULATION CONSULT NOTE  \ Pharmacy Consult for heparin Indication:  CAD  Allergies  Allergen Reactions   Codeine Nausea And Vomiting   Coffee Bean Extract [Coffea Arabica] Other (See Comments)    Religious dietary preference: no coffee, chocolate OK   Other     All narcotics causes extreme vomitting   Lisinopril Cough   Pseudoephedrine Palpitations   Surgical Lubricant Itching and Rash    Surgical glue    Patient Measurements: Height: 5\' 6"  (167.6 cm) Weight: 91.5 kg (201 lb 11.5 oz) IBW/kg (Calculated) : 59.3 Heparin Dosing Weight: 80kg  Vital Signs: Temp: 98.3 F (36.8 C) (05/24 0554) Temp Source: Oral (05/24 0554) BP: 117/72 (05/24 1129) Pulse Rate: 58 (05/24 1129)  Labs: Recent Labs    03/14/23 2236 03/15/23 0213 03/15/23 0217 03/15/23 0218 03/15/23 1610 03/15/23 1208 03/16/23 0313 03/16/23 1332 03/16/23 2048 03/17/23 0629  HGB  --  13.7  --   --   --   --  14.0  --   --  14.4  HCT  --  38.9  --   --   --   --  40.5  --   --  41.6  PLT  --  291  --   --   --   --  290  --   --  256  HEPARINUNFRC  --   --    < >  --   --    < > <0.10* 0.11* 0.29* 0.44  CREATININE  --  0.73  --   --   --   --  0.82  --   --  0.82  TROPONINIHS 122*  --   --  211* 463*  --   --   --   --   --    < > = values in this interval not displayed.     Estimated Creatinine Clearance: 91.5 mL/min (by C-G formula based on SCr of 0.82 mg/dL).   Medical History: Past Medical History:  Diagnosis Date   Car sickness    Complication of anesthesia    severe   Family history of adverse reaction to anesthesia    PONV   GERD (gastroesophageal reflux disease)    occ. OTC med used   History of esophageal stricture    stenosis dilatation 05-02-2016   History of gastric polyp    benign 05-02-2016   PONV (postoperative nausea and vomiting)    SVD (spontaneous vaginal delivery)    x 6   Tubulovillous adenoma of rectum    multiple recurrent's   Wears glasses     Medications:   Scheduled:   aspirin EC  81 mg Oral Daily   atorvastatin  80 mg Oral QHS   insulin aspart  0-6 Units Subcutaneous TID WC   losartan  12.5 mg Oral Daily   metoprolol tartrate  12.5 mg Oral BID   pantoprazole  40 mg Oral Daily   polyethylene glycol  34 g Oral Once   sodium chloride flush  3 mL Intravenous Q12H   sodium chloride flush  3 mL Intravenous Q12H   ticagrelor  90 mg Oral BID   Infusions:   sodium chloride 250 mL (03/17/23 1109)   sodium chloride     sodium chloride 100 mL/hr at 03/17/23 1147   sodium chloride      Assessment: 51yo female now s/p cath and awaiting possible surgical consult for severe multivessel CAD. Pharmacy dosing heparin  -heparin level= 0.44 on 1650 unit/hr (  heparin was restarted post cath 5/22) Cbc stable no bleeding noted Stop post cath DES x2   Goal of Therapy:  Heparin level 0.3-0.7 units/ml Monitor platelets by anticoagulation protocol: Yes   Plan:  Stop heparin drip   Leota Sauers Pharm.D. CPP, BCPS Clinical Pharmacist [phone] 03/17/2023 12:23 PM  .

## 2023-03-18 ENCOUNTER — Other Ambulatory Visit (HOSPITAL_COMMUNITY): Payer: Self-pay

## 2023-03-18 DIAGNOSIS — I214 Non-ST elevation (NSTEMI) myocardial infarction: Secondary | ICD-10-CM | POA: Diagnosis not present

## 2023-03-18 LAB — GLUCOSE, CAPILLARY
Glucose-Capillary: 163 mg/dL — ABNORMAL HIGH (ref 70–99)
Glucose-Capillary: 181 mg/dL — ABNORMAL HIGH (ref 70–99)

## 2023-03-18 LAB — CBC
HCT: 42.4 % (ref 36.0–46.0)
Hemoglobin: 14.7 g/dL (ref 12.0–15.0)
MCH: 29.8 pg (ref 26.0–34.0)
MCHC: 34.7 g/dL (ref 30.0–36.0)
MCV: 85.8 fL (ref 80.0–100.0)
Platelets: 278 10*3/uL (ref 150–400)
RBC: 4.94 MIL/uL (ref 3.87–5.11)
RDW: 13.3 % (ref 11.5–15.5)
WBC: 9.5 10*3/uL (ref 4.0–10.5)
nRBC: 0 % (ref 0.0–0.2)

## 2023-03-18 LAB — BASIC METABOLIC PANEL
Anion gap: 9 (ref 5–15)
BUN: 8 mg/dL (ref 6–20)
CO2: 22 mmol/L (ref 22–32)
Calcium: 10 mg/dL (ref 8.9–10.3)
Chloride: 103 mmol/L (ref 98–111)
Creatinine, Ser: 0.79 mg/dL (ref 0.44–1.00)
GFR, Estimated: >60 mL/min (ref >60)
Glucose, Bld: 158 mg/dL — ABNORMAL HIGH (ref 70–99)
Potassium: 4 mmol/L (ref 3.5–5.1)
Sodium: 134 mmol/L — ABNORMAL LOW (ref 135–145)

## 2023-03-18 MED ORDER — TICAGRELOR 90 MG PO TABS: 90.0000 mg | ORAL_TABLET | Freq: Two times a day (BID) | ORAL | 0 refills | Status: DC

## 2023-03-18 MED ORDER — TICAGRELOR 90 MG PO TABS
90.0000 mg | ORAL_TABLET | Freq: Two times a day (BID) | ORAL | 0 refills | Status: DC
  Filled 2023-03-18: qty 60, 30d supply, fill #0

## 2023-03-18 MED ORDER — NITROGLYCERIN 0.4 MG SL SUBL: 0.4000 mg | SUBLINGUAL_TABLET | SUBLINGUAL | 0 refills | Status: AC | PRN

## 2023-03-18 MED ORDER — ATORVASTATIN CALCIUM 80 MG PO TABS: 80.0000 mg | ORAL_TABLET | Freq: Every day | ORAL | 0 refills | Status: DC

## 2023-03-18 MED ORDER — METOPROLOL TARTRATE 25 MG PO TABS: 12.5000 mg | ORAL_TABLET | Freq: Two times a day (BID) | ORAL | 0 refills | Status: DC

## 2023-03-18 NOTE — Progress Notes (Addendum)
CARDIAC REHAB PHASE I   PRE:  Rate/Rhythm: 73 NSR  BP:  Sitting: 141/68     SaO2: 96 RA  MODE:  Ambulation: 470 ft   POST:  Rate/Rhythem: 80 NSR  BP:  Sitting: 125/64     SaO2: 96 RA  Pt ambulated 470 ft with stand by assist.. No reports of chest pain, SOB or dizziness. Pt placed back in the bed with call bell in reach. Education was reviewed and questions were answered.  Guss Bunde, RRT  Service time is from [phone] to [phone].

## 2023-03-18 NOTE — Progress Notes (Signed)
Progress Note  Patient Name: Angela Knight Date of Encounter: 03/18/2023  Primary Cardiologist:New to Dr. Izora Ribas  Subjective   Overnight no new events.  Discussed questions with patient and husband at length  Inpatient Medications    Scheduled Meds:  aspirin EC  81 mg Oral Daily   atorvastatin  80 mg Oral QHS   insulin aspart  0-6 Units Subcutaneous TID WC   losartan  12.5 mg Oral Daily   metoprolol tartrate  12.5 mg Oral BID   pantoprazole  40 mg Oral Daily   polyethylene glycol  34 g Oral Once   sodium chloride flush  3 mL Intravenous Q12H   sodium chloride flush  3 mL Intravenous Q12H   ticagrelor  90 mg Oral BID   Continuous Infusions:  sodium chloride 999 mL/hr at 03/17/23 1551   sodium chloride     sodium chloride     PRN Meds: sodium chloride, sodium chloride, acetaminophen, acetaminophen, diazepam, nitroGLYCERIN, ondansetron (ZOFRAN) IV, ondansetron (ZOFRAN) IV, mouth rinse, oxyCODONE, prochlorperazine, sodium chloride flush, sodium chloride flush   Vital Signs    Vitals:   03/17/23 2014 03/17/23 2133 03/18/23 0200 03/18/23 0642  BP: 119/61 122/64 121/61 (!) 108/58  Pulse: 60 (!) 56 (!) 56 (!) 59  Resp: 16 19 18 19   Temp: 98.9 F (37.2 C)  99.1 F (37.3 C) 98.9 F (37.2 C)  TempSrc: Oral  Oral Oral  SpO2: 95% 94% 97% 96%  Weight:   91.4 kg   Height:        Intake/Output Summary (Last 24 hours) at 03/18/2023 0854 Last data filed at 03/17/2023 2015 Gross per 24 hour  Intake 2730.92 ml  Output 400 ml  Net 2330.92 ml   Filed Weights   03/17/23 0220 03/17/23 0848 03/18/23 0200  Weight: 91.5 kg 91.5 kg 91.4 kg    Telemetry    SR to sinus bradycardia - Personally Reviewed  Physical Exam   Gen: no distress   Neck: No JVD Cardiac: No Rubs or Gallops, no murmur, regular rhythm+2 radial pulses, tender R arm with no bruits or hematomas R rad or R fem Respiratory: Clear to auscultation bilaterally, normal effort, normal  respiratory  rate GI: Soft, nontender, non-distended  MS: No  edema;  moves all extremities Integument: Skin feels warm Neuro:  At time of evaluation, alert and oriented to person/place/time/situation  Psych: Normal affect, patient feels well   Labs    Chemistry Recent Labs  Lab 03/14/23 1511 03/15/23 0213 03/16/23 0313 03/17/23 0629  NA 137 132* 135 134*  K 3.8 3.7 4.4 4.0  CL 104 101 105 102  CO2 23 21* 22 22  GLUCOSE 109* 186* 136* 161*  BUN 11 8 10 9   CREATININE 0.70 0.73 0.82 0.82  CALCIUM 10.1 9.5 9.7 10.0  PROT 7.2  --   --   --   ALBUMIN 4.9  --   --   --   AST 23  --   --   --   ALT 31  --   --   --   ALKPHOS 44  --   --   --   BILITOT 0.4  --   --   --   GFRNONAA >60 >60 >60 >60  ANIONGAP 10 10 8 10      Hematology Recent Labs  Lab 03/16/23 0313 03/17/23 0629 03/18/23 0755  WBC 7.3 8.9 9.5  RBC 4.72 4.87 4.94  HGB 14.0 14.4 14.7  HCT 40.5  41.6 42.4  MCV 85.8 85.4 85.8  MCH 29.7 29.6 29.8  MCHC 34.6 34.6 34.7  RDW 13.3 13.2 13.3  PLT 290 256 278    Cardiac EnzymesNo results for input(s): "TROPONINI" in the last 168 hours. No results for input(s): "TROPIPOC" in the last 168 hours.   BNPNo results for input(s): "BNP", "PROBNP" in the last 168 hours.   DDimer No results for input(s): "DDIMER" in the last 168 hours.   Radiology    CARDIAC CATHETERIZATION  Result Date: 03/17/2023   Mid RCA lesion is 90% stenosed.  A drug-eluting stent was successfully placed using a SYNERGY XD 3.50X20, postdilated to greater than 3.75 mm and optimized with intravascular ultrasound.   Post intervention, there is a 0% residual stenosis.   Mid LAD-1 lesion is 90% stenosed.  A drug-eluting stent was successfully placed using a SYNERGY XD 2.75X16, postdilated to greater than 3 mm and optimized with intravascular ultrasound.   Post intervention, there is a 0% residual stenosis.   Mid LAD-2 lesion is 20% stenosed.   2nd Diag lesion is 35% stenosed.  Jailed diagonal maintained TIMI-3 flow.    LV end diastolic pressure is low.   There is no aortic valve stenosis. Successful two-vessel PCI of the RCA and LAD.  Continue dual antiplatelet therapy for 12 months.  If there are bleeding issues, could stop aspirin early.  Continue aggressive secondary prevention including diabetes control and lipid-lowering therapy.  Mynx closure device was deployed.  Of note, the patient reported severe right arm pain during her diagnostic cath 2 days ago.  I suspect she had severe right radial spasm.  That is why we used the femoral approach. Results conveyed to her husband, Francee Piccolo.     Patient Profile     52 y.o. female with NSTEMI  Assessment & Plan     NSTEMI CAD and HLD - s/p PCI to LAD and RCA - anatomy: Prox-mid LAD disease and D1 bifurcation; mid RCA disease - continue ASA 96 - increaseing statin, goal LDL < 55 with elevated Tgs, outpatient lab follow up - will continue low dose BB; if fatigue in the setting of reduce AMI trial and normal LVEF will discontinu in outpatient - no nitrates needed - continue ARB (for renal protection largely) - discussed post cath red flag symptomatic at length  Planned DC today; needs outpatient f/u    For questions or updates, please contact [facility] Please consult [email or web address] for contact info under Cardiology/STEMI.      Riley Lam, MD FASE [facility] Cardiologist [facility]  [address] [employer], [address] [place], [place] [address] [phone]  8:54 AM

## 2023-03-21 ENCOUNTER — Encounter (HOSPITAL_COMMUNITY): Payer: Self-pay | Admitting: Interventional Cardiology

## 2023-03-21 ENCOUNTER — Telehealth: Payer: Self-pay | Admitting: Internal Medicine

## 2023-03-21 NOTE — Telephone Encounter (Signed)
Pt c/o medication issue:  1. Name of Medication: aspirin   2. How are you currently taking this medication (dosage and times per day)?   3. Are you having a reaction (difficulty breathing--STAT)?   4. What is your medication issue? Patient called stating while she was in hospital she was given aspirin, however on her discharge instructions it wasn't listed.  She wants to know is she is to continue taking aspirin.  Please advise.

## 2023-03-21 NOTE — Telephone Encounter (Signed)
Called pt advised that can discuss with Sw[facility]er at [facility] 03/22/23.  Pt is taking Brilinta as ordered. No further questions at this time.

## 2023-03-21 NOTE — Progress Notes (Unsigned)
Cardiology Office Note:    Date:  03/22/2023   ID:  GYANNA DIPIERRO, DOB January 14, 1971, MRN [id number]  PCP:  Delorse Lek, MD   [facility] Providers Cardiologist:  Christell Constant, MD     Referring MD: Delorse Lek, MD   Chief Complaint: post hospital follow-up s/p PCI  History of Present Illness:    Angela Knight is a very pleasant 52 y.o. female with a hx of HTN, type II DM, and HLD.  She reports family history of diabetes, hypertension, and hyperlipidemia but neither parent had cardiac events.   Presented to ED on 5//24 for evaluation of chest pain.  She started having retrosternal pressure-like sensation radiating to her left shoulder a few days prior to admission.  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  She had pressure with shortness of breath.  The discomfort was triggered by exertion and relieved with rest, lasting 5 to 10 minutes. Hs trop 57 >>94 >> 463. EKG revealed inferior TWI. Echo 03/15/23 revealed normal LVEF 65-70%, no rwma, normal diastolic parameters, normal RV, no significant valve disease.  She was discharged on recent atorvastatin 80 mg, low-dose metoprolol, losartan, Brilinta, and aspirin.  Today, she is here with her husband for follow-up. Has had right arm pain since first cath on 03/15/23. Has bruising on lateral aspect of right arm. She had pain during the cath and it has persisted since that time. Has been gradually increasing activity at home.  She denies chest pain, shortness of breath, dyspnea, palpitations, orthopnea, PND, presyncope, syncope. Does not have a BP monitor at home. Lengthy discussion about lifestyle changes for secondary prevention. Reports slight cough over the past few days. Previously intolerant of lisinopril due to cough.   Past Medical History:  Diagnosis Date   Car sickness    Complication of anesthesia    severe   Family history of adverse reaction to anesthesia    PONV   GERD (gastroesophageal reflux  disease)    occ. OTC med used   History of esophageal stricture    stenosis dilatation 05-02-2016   History of gastric polyp    benign 05-02-2016   PONV (postoperative nausea and vomiting)    SVD (spontaneous vaginal delivery)    x 6   Tubulovillous adenoma of rectum    multiple recurrent's   Wears glasses     Past Surgical History:  Procedure Laterality Date   ANTERIOR AND POSTERIOR REPAIR N/A 05/08/2017   Procedure: ANTERIOR Colporrhaphy (CYSTOCELE) AND POSTERIOR REPAIR (RECTOCELE), PERINEALORRHAPHY;  Surgeon: Olivia Mackie, MD;  Location: [facility];  Service: Gynecology;  Laterality: N/A;   COLONOSCOPY WITH PROPOFOL N/A 05/02/2016   Procedure: COLONOSCOPY WITH PROPOFOL;  Surgeon: Charolett Bumpers, MD;  Location: [facility];  Service: Endoscopy;  Laterality: N/A;   CORONARY STENT INTERVENTION N/A 03/17/2023   Procedure: CORONARY STENT INTERVENTION;  Surgeon: Corky Crafts, MD;  Location: [facility] INVASIVE CV LAB;  Service: Cardiovascular;  Laterality: N/A;   CORONARY ULTRASOUND/IVUS N/A 03/17/2023   Procedure: Coronary Ultrasound/IVUS;  Surgeon: Corky Crafts, MD;  Location: [facility] INVASIVE CV LAB;  Service: Cardiovascular;  Laterality: N/A;   ESOPHAGOGASTRODUODENOSCOPY (EGD) WITH PROPOFOL N/A 05/02/2016   Procedure: ESOPHAGOGASTRODUODENOSCOPY (EGD) WITH PROPOFOL;  Surgeon: Charolett Bumpers, MD;  Location: [facility];  Service: Endoscopy;  Laterality: N/A;   LEFT HEART CATH AND CORONARY ANGIOGRAPHY N/A 03/15/2023   Procedure: LEFT HEART CATH AND CORONARY ANGIOGRAPHY;  Surgeon: Lennette Bihari, MD;  Location:  [facility] INVASIVE CV LAB;  Service: Cardiovascular;  Laterality: N/A;   LEFT HEART CATH AND CORONARY ANGIOGRAPHY N/A 03/17/2023   Procedure: LEFT HEART CATH AND CORONARY ANGIOGRAPHY;  Surgeon: Corky Crafts, MD;  Location: [facility] INVASIVE CV LAB;  Service: Cardiovascular;  Laterality: N/A;   ROBOTIC ASSISTED TOTAL HYSTERECTOMY WITH SALPINGECTOMY Bilateral 05/08/2017   Procedure:  ROBOTIC ASSISTED TOTAL HYSTERECTOMY WITH SALPINGECTOMY And Uterosacral Ligament Suspension;  Surgeon: Olivia Mackie, MD;  Location: [facility];  Service: Gynecology;  Laterality: Bilateral;   SIGMOIDOSCOPY  multiple--   TRANSANAL EXCISION OF RECTAL MASS  06-12-2003 and 05-24-2004   tubulovillious adenoma   TUMOR EXCISION N/A 07/05/2016   Procedure: TRANSANAL EXCISION OF TUBULLOVILLOUS ADENOMA OF RECTUM;  Surgeon: Avel Peace, MD;  Location: [facility];  Service: General;  Laterality: N/A;    Current Medications: Current Meds  Medication Sig   aspirin EC 81 MG tablet Take 1 tablet (81 mg total) by mouth daily. Swallow whole.   atorvastatin (LIPITOR) 80 MG tablet Take 1 tablet (80 mg total) by mouth at bedtime.   estradiol (VIVELLE-DOT) 0.0375 MG/24HR Place 1 patch onto the skin 2 (two) times a week.   famotidine-calcium carbonate-magnesium hydroxide (PEPCID COMPLETE) 10-800-165 MG chewable tablet Chew 1 tablet by mouth every evening.   glipiZIDE (GLUCOTROL XL) 5 MG 24 hr tablet Take 5 mg by mouth daily.   losartan (COZAAR) 25 MG tablet Take 0.5 tablets by mouth daily.   metFORMIN (GLUCOPHAGE-XR) 500 MG 24 hr tablet Take 500 mg by mouth 2 (two) times daily.   metoprolol tartrate (LOPRESSOR) 25 MG tablet Take 0.5 tablets (12.5 mg total) by mouth 2 (two) times daily.   nitroGLYCERIN (NITROSTAT) 0.4 MG SL tablet Place 1 tablet (0.4 mg total) under the tongue every 5 (five) minutes x 3 doses as needed for chest pain.   OMEPRAZOLE PO Take 1 tablet by mouth daily. Pt unsure of strength   ticagrelor (BRILINTA) 90 MG TABS tablet Take 1 tablet (90 mg total) by mouth 2 (two) times daily.     Allergies:   Codeine, Coffee bean extract [coffea arabica], Other, Lisinopril, Pseudoephedrine, and Surgical lubricant   Social History   Socioeconomic History   Marital status: Married    Spouse name: Not on file   Number of children: Not on file   Years of education: Not on file   Highest  education level: Not on file  Occupational History   Not on file  Tobacco Use   Smoking status: Never   Smokeless tobacco: Never  Vaping Use   Vaping Use: Never used  Substance and Sexual Activity   Alcohol use: No   Drug use: No   Sexual activity: Yes    Birth control/protection: Condom  Other Topics Concern   Not on file  Social History Narrative   Not on file   Social Determinants of Health   Financial Resource Strain: Not on file  Food Insecurity: No Food Insecurity (03/15/2023)   Hunger Vital Sign    Worried About Running Out of Food in the Last Year: Never true    Ran Out of Food in the Last Year: Never true  Transportation Needs: No Transportation Needs (03/15/2023)   PRAPARE - Administrator, Civil Service (Medical): No    Lack of Transportation (Non-Medical): No  Physical Activity: Not on file  Stress: Not on file  Social Connections: Not on file     Family History: The patient's family history includes Cancer in  her father and mother; Diabetes in her mother and sister; High blood pressure in her mother.  ROS:   Please see the history of present illness.    + right arm pain All other systems reviewed and are negative.  Labs/Other Studies Reviewed:    The following studies were reviewed today:  LHC and Coronary Intervention 03/17/23   Mid RCA lesion is 90% stenosed.  A drug-eluting stent was successfully placed using a SYNERGY XD 3.50X20, postdilated to greater than 3.75 mm and optimized with intravascular ultrasound.   Post intervention, there is a 0% residual stenosis.   Mid LAD-1 lesion is 90% stenosed.  A drug-eluting stent was successfully placed using a SYNERGY XD 2.75X16, postdilated to greater than 3 mm and optimized with intravascular ultrasound.   Post intervention, there is a 0% residual stenosis.   Mid LAD-2 lesion is 20% stenosed.   2nd Diag lesion is 35% stenosed.  Jailed diagonal maintained TIMI-3 flow.   LV end diastolic pressure is  low.   There is no aortic valve stenosis.   Successful two-vessel PCI of the RCA and LAD.  Continue dual antiplatelet therapy for 12 months.  If there are bleeding issues, could stop aspirin early.  Continue aggressive secondary prevention including diabetes control and lipid-lowering therapy.  Mynx closure device was deployed.  Of note, the patient reported severe right arm pain during her diagnostic cath 2 days ago.  I suspect she had severe right radial spasm.  That is why we used the femoral approach  LHC 03/15/23   Mid LAD-1 lesion is 90% stenosed.   2nd Diag lesion is 35% stenosed.   Prox RCA lesion is 95% stenosed.   Mid LAD-2 lesion is 20% stenosed.   Non-stenotic Prox RCA to Mid RCA lesion.   The left ventricular systolic function is normal.   LV end diastolic pressure is mildly elevated.   The left ventricular ejection fraction is 55-65% by visual estimate.   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   Normal left circumflex coronary artery.   Normal global LV function with EF estimate approximately 50 - 55%.  LVEDP mildly elevated at 21 mmHg.   RECOMMENDATION: Consider surgical consultation in light of complex large LAD/diagonal bifurcation stenosis and high-grade large dominant RCA.  Will have colleagues review.  Will reinitiate heparin several hours post TR band removal.   Echo 03/15/23 1. Left ventricular ejection fraction, by estimation, is 65 to 70%. The  left ventricle has normal function. The left ventricle has no regional  wall motion abnormalities. There is mild concentric left ventricular  hypertrophy. Left ventricular diastolic  parameters were normal.   2. Right ventricular systolic function is normal. The right ventricular  size is mildly  enlarged. Tricuspid regurgitation signal is inadequate for  assessing PA pressure.   3. The mitral valve is normal in structure. Trivial mitral valve  regurgitation.   4. The aortic valve is tricuspid. Aortic valve regurgitation is not  visualized.   5. The inferior vena cava is normal in size with greater than 50%  respiratory variability, suggesting right atrial pressure of 3 mmHg.  Recent Labs: 03/14/2023: ALT 31 03/15/2023: Magnesium 2.0 03/18/2023: BUN 8; Creatinine, Ser 0.79; Hemoglobin 14.7; Platelets 278; Potassium 4.0; Sodium 134  Recent Lipid Panel  Component Value Date/Time   CHOL 170 03/15/2023 1208   TRIG 200 (H) 03/15/2023 1208   HDL 43 03/15/2023 1208   CHOLHDL 4.0 03/15/2023 1208   VLDL 40 03/15/2023 1208   LDLCALC 87 03/15/2023 1208     Risk Assessment/Calculations:           Physical Exam:    VS:  BP 110/74   Pulse 68   Ht 5\' 6"  (1.676 m)   Wt 202 lb 6.4 oz (91.8 kg)   LMP 04/27/2017 (Approximate)   SpO2 97%   BMI 32.67 kg/m     Wt Readings from Last 3 Encounters:  03/22/23 202 lb 6.4 oz (91.8 kg)  03/18/23 201 lb 9.6 oz (91.4 kg)  11/04/21 205 lb (93 kg)     GEN:  Well nourished, well developed in no acute distress HEENT: Normal NECK: No JVD; No carotid bruits CARDIAC: RRR, no murmurs, rubs, gallops RESPIRATORY:  Clear to auscultation without rales, wheezing or rhonchi  ABDOMEN: Soft, non-tender, non-distended MUSCULOSKELETAL:   Bruising to lateral aspect of right arm, no erythema, swelling, or coolness. 2+ pedal/radial pulses, equal bilaterally SKIN: Warm and dry NEUROLOGIC:  Alert and oriented x 3 PSYCHIATRIC:  Normal affect   EKG:  EKG is ordered today.  The ekg ordered today demonstrates normal sinus rhythm at 63 bpm, low voltage QRS, nonspecific T wave abnormality, no acute change from previous tracing      Diagnoses:    1. Coronary artery disease involving native coronary artery of native heart without angina pectoris   2.  NSTEMI (non-ST elevated myocardial infarction) (HCC)   3. Primary hypertension   4. Mixed hyperlipidemia   5. Right arm pain    Assessment and Plan:     CAD without angina: S/p NSTEMI. Cath 03/15/23 revealed severe CAD with complex LAD/diagonal bifurcation stenosis in large LAD and large diag vessel both extending to LV apex and high grade 95% mid RCA stenosis followed by aneurysmal dilt of RCA in dominant vessel, normal LCx. Coronary intervention 03/17/23 to Mid RCA lesion 90% with DES, mid LAD lesion 90% stenosis with DES, plan for DAPT x 12 months. She denies chest pain, dyspnea, or other symptoms concerning for angina.  No indication for further ischemic evaluation at this time. Her discharge instructions did not include aspirin. Advised her to start enteric-coated aspirin 81 mg daily. No bleeding concerns. Lengthy discussion about lifestyle modification for secondary prevention. Continue Brilinta, atorvastatin, losartan, metoprolol. Is anxious to start cardiac rehab.   Hyperlipidemia: LDL 87, Lp(a) 56.3 on 03/15/23.  She was previously on a lower dose of rosuvastatin.  We will recheck lipid panel in 2 to 3 months. Encouraged 150 minutes moderate intensity exercise each week and heart healthy, mostly plant based diet.   Hypertension: BP is well controlled. Reports slight cough over the past few days. Previously intolerant of lisinopril with cough. Asked her to notify us if cough continues.  Could consider increase in metoprolol or low-dose amlodipine if BP remains elevated.  Right arm pain: Right arm pain and bruising since cath 5/22. No bruit on auscultation. 2+ radial pulses. Bruising appears in appropriate stage of healing. No numbness, swelling, or temperature change.  Encouraged her to continue gentle activity with arm and to elevate it at rest to see if pain improves. If no improvement, advised her to call and we will order vascular ultrasound.     Cardiac Rehabilitation Eligibility  Assessment  The patient is ready to start cardiac rehabilitation from a  cardiac standpoint.        Disposition: 3 months with Dr. Izora Ribas or me  Medication Adjustments/Labs and Tests Ordered: Current medicines are reviewed at length with the patient today.  Concerns regarding medicines are outlined above.  No orders of the defined types were placed in this encounter.  Meds ordered this encounter  Medications   aspirin EC 81 MG tablet    Sig: Take 1 tablet (81 mg total) by mouth daily. Swallow whole.    Dispense:  90 tablet    Refill:  3    Patient Instructions  Medication Instructions:   START Asprin EC one (1) tablet by mouth ( 81 mg ) daily.   *If you need a refill on your cardiac medications before your next appointment, please call your pharmacy*   Lab Work:  None ordered.  If you have labs (blood work) drawn today and your tests are completely normal, you will receive your results only by: MyChart Message (if you have MyChart) OR A paper copy in the mail If you have any lab test that is abnormal or we need to change your treatment, we will call you to review the results.   Testing/Procedures:  None ordered.   Follow-Up: At [facility], you and your health needs are our priority.  As part of our continuing mission to provide you with exceptional heart care, we have created designated Provider Care Teams.  These Care Teams include your primary Cardiologist (physician) and Advanced Practice Providers (APPs -  Physician Assistants and Nurse Practitioners) who all work together to provide you with the care you need, when you need it.  We recommend signing up for the patient portal called "MyChart".  Sign up information is provided on this After Visit Summary.  MyChart is used to connect with patients for Virtual Visits (Telemedicine).  Patients are able to view lab/test results, encounter notes, upcoming appointments, etc.  Non-urgent messages can be sent  to your provider as well.   To learn more about what you can do with MyChart, go to [email or web address].    Your next appointment:   3 month(s)  Provider:   Eligha Bridegroom, NP         Other Instructions  Mediterranean Diet A Mediterranean diet refers to food and lifestyle choices that are based on the traditions of countries located on the [place] Sea. It focuses on eating more fruits, vegetables, whole grains, beans, nuts, seeds, and heart-healthy fats, and eating less dairy, meat, eggs, and processed foods with added sugar, salt, and fat. This way of eating has been shown to help prevent certain conditions and improve outcomes for people who have chronic diseases, like kidney disease and heart disease. What are tips for following this plan? Reading food labels Check the serving size of packaged foods. For foods such as rice and pasta, the serving size refers to the amount of cooked product, not dry. Check the total fat in packaged foods. Avoid foods that have saturated fat or trans fats. Check the ingredient list for added sugars, such as corn syrup. Shopping  Buy a variety of foods that offer a balanced diet, including: Fresh fruits and vegetables (produce). Grains, beans, nuts, and seeds. Some of these may be available in unpackaged forms or large amounts (in bulk). Fresh seafood. Poultry and eggs. Low-fat dairy products. Buy whole ingredients instead of prepackaged foods. Buy fresh fruits and vegetables in-season from local farmers markets. Buy plain frozen fruits and vegetables. If you do not have  access to quality fresh seafood, buy precooked frozen shrimp or canned fish, such as tuna, salmon, or sardines. Stock your pantry so you always have certain foods on hand, such as olive oil, canned tuna, canned tomatoes, rice, pasta, and beans. Cooking Cook foods with extra-virgin olive oil instead of using butter or other vegetable oils. Have meat as a side dish, and have  vegetables or grains as your main dish. This means having meat in small portions or adding small amounts of meat to foods like pasta or stew. Use beans or vegetables instead of meat in common dishes like chili or lasagna. Experiment with different cooking methods. Try roasting, broiling, steaming, and sauting vegetables. Add frozen vegetables to soups, stews, pasta, or rice. Add nuts or seeds for added healthy fats and plant protein at each meal. You can add these to yogurt, salads, or vegetable dishes. Marinate fish or vegetables using olive oil, lemon juice, garlic, and fresh herbs. Meal planning Plan to eat one vegetarian meal one day each week. Try to work up to two vegetarian meals, if possible. Eat seafood two or more times a week. Have healthy snacks readily available, such as: Vegetable sticks with hummus. Greek yogurt. Fruit and nut trail mix. Eat balanced meals throughout the week. This includes: Fruit: 2-3 servings a day. Vegetables: 4-5 servings a day. Low-fat dairy: 2 servings a day. Fish, poultry, or lean meat: 1 serving a day. Beans and legumes: 2 or more servings a week. Nuts and seeds: 1-2 servings a day. Whole grains: 6-8 servings a day. Extra-virgin olive oil: 3-4 servings a day. Limit red meat and sweets to only a few servings a month. Lifestyle  Cook and eat meals together with your family, when possible. Drink enough fluid to keep your urine pale yellow. Be physically active every day. This includes: Aerobic exercise like running or swimming. Leisure activities like gardening, walking, or housework. Get 7-8 hours of sleep each night. If recommended by your health care provider, drink red wine in moderation. This means 1 glass a day for nonpregnant women and 2 glasses a day for men. A glass of wine equals 5 oz (150 mL). What foods should I eat? Fruits Apples. Apricots. Avocado. Berries. Bananas. Cherries. Dates. Figs. Grapes. Lemons. Melon. Oranges. Peaches.  Plums. Pomegranate. Vegetables Artichokes. Beets. Broccoli. Cabbage. Carrots. Eggplant. Green beans. Chard. Kale. Spinach. Onions. Leeks. Peas. Squash. Tomatoes. Peppers. Radishes. Grains Whole-grain pasta. Brown rice. Bulgur wheat. Polenta. Couscous. Whole-wheat bread. Orpah Cobb. Meats and other proteins Beans. Almonds. Sunflower seeds. Pine nuts. Peanuts. Cod. Salmon. Scallops. Shrimp. Tuna. Tilapia. Clams. Oysters. Eggs. Poultry without skin. Dairy Low-fat milk. Cheese. Greek yogurt. Fats and oils Extra-virgin olive oil. Avocado oil. Grapeseed oil. Beverages Water. Red wine. Herbal tea. Sweets and desserts Greek yogurt with honey. Baked apples. Poached pears. Trail mix. Seasonings and condiments Basil. Cilantro. Coriander. Cumin. Mint. Parsley. Sage. Rosemary. Tarragon. Garlic. Oregano. Thyme. Pepper. Balsamic vinegar. Tahini. Hummus. Tomato sauce. Olives. Mushrooms. The items listed above may not be a complete list of foods and beverages you can eat. Contact a dietitian for more information. What foods should I limit? This is a list of foods that should be eaten rarely or only on special occasions. Fruits Fruit canned in syrup. Vegetables Deep-fried potatoes (french fries). Grains Prepackaged pasta or rice dishes. Prepackaged cereal with added sugar. Prepackaged snacks with added sugar. Meats and other proteins Beef. Pork. Lamb. Poultry with skin. Hot dogs. Tomasa Blase. Dairy Ice cream. Sour cream. Whole milk. Fats and oils Butter.  Canola oil. Vegetable oil. Beef fat (tallow). Lard. Beverages Juice. Sugar-sweetened soft drinks. Beer. Liquor and spirits. Sweets and desserts Cookies. Cakes. Pies. Candy. Seasonings and condiments Mayonnaise. Pre-made sauces and marinades. The items listed above may not be a complete list of foods and beverages you should limit. Contact a dietitian for more information. Summary The Mediterranean diet includes both food and lifestyle  choices. Eat a variety of fresh fruits and vegetables, beans, nuts, seeds, and whole grains. Limit the amount of red meat and sweets that you eat. If recommended by your health care provider, drink red wine in moderation. This means 1 glass a day for nonpregnant women and 2 glasses a day for men. A glass of wine equals 5 oz (150 mL). This information is not intended to replace advice given to you by your health care provider. Make sure you discuss any questions you have with your health care provider. Document Revised: 11/15/2019 Document Reviewed: 09/12/2019 Elsevier Patient Education  2023 Elsevier Inc. Adopting a Healthy Lifestyle.   Weight: Know what a healthy weight is for you (roughly BMI <25) and aim to maintain this. You can calculate your body mass index on your smart phone  Diet: Aim for 7+ servings of fruits and vegetables daily Limit animal fats in diet for cholesterol and heart health - choose grass fed whenever available Avoid highly processed foods (fast food burgers, tacos, fried chicken, pizza, hot dogs, french fries)  Saturated fat comes in the form of butter, lard, coconut oil, margarine, partially hydrogenated oils, and fat in meat. These increase your risk of cardiovascular disease.  Use healthy plant oils, such as olive, canola, soy, corn, sunflower and peanut.  Whole foods such as fruits, vegetables and whole grains have fiber  Men need > 38 grams of fiber per day Women need > 25 grams of fiber per day  Load up on vegetables and fruits - one-half of your plate: Aim for color and variety, and remember that potatoes dont count. Go for whole grains - one-quarter of your plate: Whole wheat, barley, wheat berries, quinoa, oats, brown rice, and foods made with them. If you want pasta, go with whole wheat pasta. Protein power - one-quarter of your plate: Fish, chicken, beans, and nuts are all healthy, versatile protein sources. Limit red meat. You need carbohydrates for  energy! The type of carbohydrate is more important than the amount. Choose carbohydrates such as vegetables, fruits, whole grains, beans, and nuts in the place of white rice, white pasta, potatoes (baked or fried), macaroni and cheese, cakes, cookies, and donuts.  If youre thirsty, drink water. Coffee and tea are good in moderation, but skip sugary drinks and limit milk and dairy products to one or two daily servings. Keep sugar intake at 6 teaspoons or 24 grams or LESS       Exercise: Aim for 150 min of moderate intensity exercise weekly for heart health, and weights twice weekly for bone health Stay active - any steps are better than no steps! Aim for 7-9 hours of sleep daily          Signed, Levi Aland, NP  03/22/2023 4:23 PM    [facility]

## 2023-03-21 NOTE — Telephone Encounter (Signed)
Patient states she is returning a call. 

## 2023-03-22 ENCOUNTER — Ambulatory Visit: Payer: BC Managed Care – PPO | Attending: Nurse Practitioner | Admitting: Nurse Practitioner

## 2023-03-22 ENCOUNTER — Encounter: Payer: Self-pay | Admitting: Nurse Practitioner

## 2023-03-22 VITALS — BP 110/74 | HR 68 | Ht 66.0 in | Wt 202.4 lb

## 2023-03-22 DIAGNOSIS — I214 Non-ST elevation (NSTEMI) myocardial infarction: Secondary | ICD-10-CM

## 2023-03-22 DIAGNOSIS — E782 Mixed hyperlipidemia: Secondary | ICD-10-CM | POA: Diagnosis not present

## 2023-03-22 DIAGNOSIS — M79601 Pain in right arm: Secondary | ICD-10-CM

## 2023-03-22 DIAGNOSIS — I2510 Atherosclerotic heart disease of native coronary artery without angina pectoris: Secondary | ICD-10-CM | POA: Diagnosis not present

## 2023-03-22 DIAGNOSIS — I10 Essential (primary) hypertension: Secondary | ICD-10-CM | POA: Diagnosis not present

## 2023-03-22 MED ORDER — ASPIRIN 81 MG PO TBEC: 81.0000 mg | DELAYED_RELEASE_TABLET | Freq: Every day | ORAL | 3 refills | Status: AC

## 2023-03-22 NOTE — Patient Instructions (Signed)
Medication Instructions:   START Asprin EC one (1) tablet by mouth ( 81 mg ) daily.   *If you need a refill on your cardiac medications before your next appointment, please call your pharmacy*   Lab Work:  None ordered.  If you have labs (blood work) drawn today and your tests are completely normal, you will receive your results only by: MyChart Message (if you have MyChart) OR A paper copy in the mail If you have any lab test that is abnormal or we need to change your treatment, we will call you to review the results.   Testing/Procedures:  None ordered.   Follow-Up: At [facility], you and your health needs are our priority.  As part of our continuing mission to provide you with exceptional heart care, we have created designated Provider Care Teams.  These Care Teams include your primary Cardiologist (physician) and Advanced Practice Providers (APPs -  Physician Assistants and Nurse Practitioners) who all work together to provide you with the care you need, when you need it.  We recommend signing up for the patient portal called "MyChart".  Sign up information is provided on this After Visit Summary.  MyChart is used to connect with patients for Virtual Visits (Telemedicine).  Patients are able to view lab/test results, encounter notes, upcoming appointments, etc.  Non-urgent messages can be sent to your provider as well.   To learn more about what you can do with MyChart, go to [email or web address].    Your next appointment:   3 month(s)  Provider:   Eligha Bridegroom, NP         Other Instructions  Mediterranean Diet A Mediterranean diet refers to food and lifestyle choices that are based on the traditions of countries located on the [place]. It focuses on eating more fruits, vegetables, whole grains, beans, nuts, seeds, and heart-healthy fats, and eating less dairy, meat, eggs, and processed foods with added sugar, salt, and fat. This way of eating has  been shown to help prevent certain conditions and improve outcomes for people who have chronic diseases, like kidney disease and heart disease. What are tips for following this plan? Reading food labels Check the serving size of packaged foods. For foods such as rice and pasta, the serving size refers to the amount of cooked product, not dry. Check the total fat in packaged foods. Avoid foods that have saturated fat or trans fats. Check the ingredient list for added sugars, such as corn syrup. Shopping  Buy a variety of foods that offer a balanced diet, including: Fresh fruits and vegetables (produce). Grains, beans, nuts, and seeds. Some of these may be available in unpackaged forms or large amounts (in bulk). Fresh seafood. Poultry and eggs. Low-fat dairy products. Buy whole ingredients instead of prepackaged foods. Buy fresh fruits and vegetables in-season from local farmers markets. Buy plain frozen fruits and vegetables. If you do not have access to quality fresh seafood, buy precooked frozen shrimp or canned fish, such as tuna, salmon, or sardines. Stock your pantry so you always have certain foods on hand, such as olive oil, canned tuna, canned tomatoes, rice, pasta, and beans. Cooking Cook foods with extra-virgin olive oil instead of using butter or other vegetable oils. Have meat as a side dish, and have vegetables or grains as your main dish. This means having meat in small portions or adding small amounts of meat to foods like pasta or stew. Use beans or vegetables instead of meat in common dishes  like chili or lasagna. Experiment with different cooking methods. Try roasting, broiling, steaming, and sauting vegetables. Add frozen vegetables to soups, stews, pasta, or rice. Add nuts or seeds for added healthy fats and plant protein at each meal. You can add these to yogurt, salads, or vegetable dishes. Marinate fish or vegetables using olive oil, lemon juice, garlic, and fresh  herbs. Meal planning Plan to eat one vegetarian meal one day each week. Try to work up to two vegetarian meals, if possible. Eat seafood two or more times a week. Have healthy snacks readily available, such as: Vegetable sticks with hummus. Greek yogurt. Fruit and nut trail mix. Eat balanced meals throughout the week. This includes: Fruit: 2-3 servings a day. Vegetables: 4-5 servings a day. Low-fat dairy: 2 servings a day. Fish, poultry, or lean meat: 1 serving a day. Beans and legumes: 2 or more servings a week. Nuts and seeds: 1-2 servings a day. Whole grains: 6-8 servings a day. Extra-virgin olive oil: 3-4 servings a day. Limit red meat and sweets to only a few servings a month. Lifestyle  Cook and eat meals together with your family, when possible. Drink enough fluid to keep your urine pale yellow. Be physically active every day. This includes: Aerobic exercise like running or swimming. Leisure activities like gardening, walking, or housework. Get 7-8 hours of sleep each night. If recommended by your health care provider, drink red wine in moderation. This means 1 glass a day for nonpregnant women and 2 glasses a day for men. A glass of wine equals 5 oz (150 mL). What foods should I eat? Fruits Apples. Apricots. Avocado. Berries. Bananas. Cherries. Dates. Figs. Grapes. Lemons. Melon. Oranges. Peaches. Plums. Pomegranate. Vegetables Artichokes. Beets. Broccoli. Cabbage. Carrots. Eggplant. Green beans. Chard. Kale. Spinach. Onions. Leeks. Peas. Squash. Tomatoes. Peppers. Radishes. Grains Whole-grain pasta. Brown rice. Bulgur wheat. Polenta. Couscous. Whole-wheat bread. Orpah Cobb. Meats and other proteins Beans. Almonds. Sunflower seeds. Pine nuts. Peanuts. Cod. Salmon. Scallops. Shrimp. Tuna. Tilapia. Clams. Oysters. Eggs. Poultry without skin. Dairy Low-fat milk. Cheese. Greek yogurt. Fats and oils Extra-virgin olive oil. Avocado oil. Grapeseed oil. Beverages Water.  Red wine. Herbal tea. Sweets and desserts Greek yogurt with honey. Baked apples. Poached pears. Trail mix. Seasonings and condiments Basil. Cilantro. Coriander. Cumin. Mint. Parsley. Sage. Rosemary. Tarragon. Garlic. Oregano. Thyme. Pepper. Balsamic vinegar. Tahini. Hummus. Tomato sauce. Olives. Mushrooms. The items listed above may not be a complete list of foods and beverages you can eat. Contact a dietitian for more information. What foods should I limit? This is a list of foods that should be eaten rarely or only on special occasions. Fruits Fruit canned in syrup. Vegetables Deep-fried potatoes (french fries). Grains Prepackaged pasta or rice dishes. Prepackaged cereal with added sugar. Prepackaged snacks with added sugar. Meats and other proteins Beef. Pork. Lamb. Poultry with skin. Hot dogs. Tomasa Blase. Dairy Ice cream. Sour cream. Whole milk. Fats and oils Butter. Canola oil. Vegetable oil. Beef fat (tallow). Lard. Beverages Juice. Sugar-sweetened soft drinks. Beer. Liquor and spirits. Sweets and desserts Cookies. Cakes. Pies. Candy. Seasonings and condiments Mayonnaise. Pre-made sauces and marinades. The items listed above may not be a complete list of foods and beverages you should limit. Contact a dietitian for more information. Summary The Mediterranean diet includes both food and lifestyle choices. Eat a variety of fresh fruits and vegetables, beans, nuts, seeds, and whole grains. Limit the amount of red meat and sweets that you eat. If recommended by your health care provider, drink red wine  in moderation. This means 1 glass a day for nonpregnant women and 2 glasses a day for men. A glass of wine equals 5 oz (150 mL). This information is not intended to replace advice given to you by your health care provider. Make sure you discuss any questions you have with your health care provider. Document Revised: 11/15/2019 Document Reviewed: 09/12/2019 Elsevier Patient Education   2023 [employer] Adopting a Healthy Lifestyle.   Weight: Know what a healthy weight is for you (roughly BMI <25) and aim to maintain this. You can calculate your body mass index on your smart phone  Diet: Aim for 7+ servings of fruits and vegetables daily Limit animal fats in diet for cholesterol and heart health - choose grass fed whenever available Avoid highly processed foods (fast food burgers, tacos, fried chicken, pizza, hot dogs, french fries)  Saturated fat comes in the form of butter, lard, coconut oil, margarine, partially hydrogenated oils, and fat in meat. These increase your risk of cardiovascular disease.  Use healthy plant oils, such as olive, canola, soy, corn, sunflower and peanut.  Whole foods such as fruits, vegetables and whole grains have fiber  Men need > 38 grams of fiber per day Women need > 25 grams of fiber per day  Load up on vegetables and fruits - one-half of your plate: Aim for color and variety, and remember that potatoes dont count. Go for whole grains - one-quarter of your plate: Whole wheat, barley, wheat berries, quinoa, oats, brown rice, and foods made with them. If you want pasta, go with whole wheat pasta. Protein power - one-quarter of your plate: Fish, chicken, beans, and nuts are all healthy, versatile protein sources. Limit red meat. You need carbohydrates for energy! The type of carbohydrate is more important than the amount. Choose carbohydrates such as vegetables, fruits, whole grains, beans, and nuts in the place of white rice, white pasta, potatoes (baked or fried), macaroni and cheese, cakes, cookies, and donuts.  If youre thirsty, drink water. Coffee and tea are good in moderation, but skip sugary drinks and limit milk and dairy products to one or two daily servings. Keep sugar intake at 6 teaspoons or 24 grams or LESS       Exercise: Aim for 150 min of moderate intensity exercise weekly for heart health, and weights twice weekly for bone  health Stay active - any steps are better than no steps! Aim for 7-9 hours of sleep daily

## 2023-03-23 ENCOUNTER — Telehealth: Payer: Self-pay | Admitting: *Deleted

## 2023-03-23 DIAGNOSIS — E782 Mixed hyperlipidemia: Secondary | ICD-10-CM

## 2023-03-23 NOTE — Telephone Encounter (Signed)
-----   Message from Levi Aland, NP sent at 03/22/2023  4:31 PM EDT ----- Please call her and have her come in for fasting lipid/cmet a few days prior to the August appointment.  Thanks!

## 2023-03-23 NOTE — Telephone Encounter (Signed)
Call placed to pt regarding staff message below.  Pt scheduled 05/24/23 for fasting LIPID / CMET

## 2023-03-24 NOTE — Addendum Note (Signed)
Addended by: Eden Lathe on: 03/24/2023 08:11 AM   Modules accepted: Orders

## 2023-03-25 NOTE — Discharge Summary (Signed)
Physician Discharge Summary   Patient: Angela Knight MRN: [id number] DOB: 1971/05/01  Admit date:     03/14/2023  Discharge date: 03/18/2023  Discharge Physician: Pieter Partridge   PCP: Delorse Lek, MD   Recommendations at discharge:    Follow up with cardiology as scheduled   Discharge Diagnoses: Principal Problem:   NSTEMI (non-ST elevated myocardial infarction) (HCC) Active Problems:   Type 2 diabetes mellitus (HCC)   Hypertension   Hyperlipidemia  Resolved Problems:   * No resolved hospital problems. *  Hospital Course: 52 y.o. female with medical history significant for hypertension, hyperlipidemia, and type 2 diabetes mellitus presented for evaluation of discomfort in her shoulder and chest, as well as torsional dyspnea. Patient reports developing an ache involving bilateral shoulders and a heavy sensation in her chest beginning the night of 03/12/2023.  The following day, she became profoundly fatigued and dyspneic while walking down the hall at work.  This was very unusual for her, she felt better after resting for several minutes, but has had recurrence in the symptoms with exertion. [facility] where: Hemodynamically stable afebrile not hypoxic EKG with inferior T wave inversions CTA chest negative for PE troponin elevated 57 to 80 to 94. Cardiology was consulted suspected to have NSTEMI given aspirin 324 mg placed on heparin and admitted to [facility] with risk factors of DM and unstable anginal symptoms with exertion PTA/ S/P LHC 5/22: Prox-mid LAD disease and D1 bifurcation; mid RCA disease.   Assessment and Plan:   NSTEMI CAD and HLD - s/p PCI to LAD and RCA - anatomy: Prox-mid LAD disease and D1 bifurcation; mid RCA disease - continue ASA 81 - increaseing statin, goal LDL < 55 with elevated Tgs, outpatient lab follow up - will continue low dose BB; if fatigue in the setting of reduce AMI trial and normal LVEF will discontinu in  outpatient - no nitrates needed - continue ARB (for renal protection largely) - discussed post cath red flag symptomatic at length   Consultants: Cardiology Procedures performed: Cardiac Catheterization 5/24    Mid RCA lesion is 90% stenosed.  A drug-eluting stent was successfully placed using a SYNERGY XD 3.50X20, postdilated to greater than 3.75 mm and optimized with intravascular ultrasound.   Post intervention, there is a 0% residual stenosis.   Mid LAD-1 lesion is 90% stenosed.  A drug-eluting stent was successfully placed using a SYNERGY XD 2.75X16, postdilated to greater than 3 mm and optimized with intravascular ultrasound.   Post intervention, there is a 0% residual stenosis.   Mid LAD-2 lesion is 20% stenosed.   2nd Diag lesion is 35% stenosed.  Jailed diagonal maintained TIMI-3 flow.   LV end diastolic pressure is low.   There is no aortic valve stenosis. Successful two-vessel PCI of the RCA and LAD.  Continue dual antiplatelet therapy for 12 months.  If there are bleeding issues, could stop aspirin early.  Continue aggressive secondary prevention including diabetes control and lipid-lowering therapy.  Mynx closure device was deployed.  Of note, the patient reported severe right arm pain during her diagnostic cath 2 days ago.  I suspect she had severe right radial spasm.  That is why we used the femoral approach. Results conveyed to her husband, Francee Piccolo.    Disposition: Home Diet recommendation:  Discharge Diet Orders (From admission, onward)     Start     Ordered   03/18/23 0000  Diet - low sodium heart healthy  03/18/23 1314   03/18/23 0000  Diet Carb Modified        03/18/23 1314           Cardiac diet DISCHARGE MEDICATION: Allergies as of 03/18/2023       Reactions   Codeine Nausea And Vomiting   Coffee Bean Extract [coffea Arabica] Other (See Comments)   Religious dietary preference: no coffee, chocolate OK   Other    All narcotics causes extreme vomitting    Lisinopril Cough   Pseudoephedrine Palpitations   Surgical Lubricant Itching, Rash   Surgical glue        Medication List     STOP taking these medications    rosuvastatin 5 MG tablet Commonly known as: CRESTOR       TAKE these medications    atorvastatin 80 MG tablet Commonly known as: LIPITOR Take 1 tablet (80 mg total) by mouth at bedtime.   Brilinta 90 MG Tabs tablet Generic drug: ticagrelor Take 1 tablet (90 mg total) by mouth 2 (two) times daily.   estradiol 0.0375 MG/24HR Commonly known as: VIVELLE-DOT Place 1 patch onto the skin 2 (two) times a week.   famotidine-calcium carbonate-magnesium hydroxide 10-800-165 MG chewable tablet Commonly known as: PEPCID COMPLETE Chew 1 tablet by mouth every evening.   glipiZIDE 5 MG 24 hr tablet Commonly known as: GLUCOTROL XL Take 5 mg by mouth daily.   losartan 25 MG tablet Commonly known as: COZAAR Take 0.5 tablets by mouth daily.   metFORMIN 500 MG 24 hr tablet Commonly known as: GLUCOPHAGE-XR Take 500 mg by mouth 2 (two) times daily.   metoprolol tartrate 25 MG tablet Commonly known as: LOPRESSOR Take 0.5 tablets (12.5 mg total) by mouth 2 (two) times daily.   nitroGLYCERIN 0.4 MG SL tablet Commonly known as: NITROSTAT Place 1 tablet (0.4 mg total) under the tongue every 5 (five) minutes x 3 doses as needed for chest pain.   Ozempic (0.25 or 0.5 MG/DOSE) 2 MG/3ML Sopn Generic drug: Semaglutide(0.25 or 0.5MG /DOS) Inject 0.25 mg into the skin once a week.   Vibegron 75 MG Tabs Take 1 tablet by mouth daily.        Follow-up Information     Swinyer, Zachary George, NP Follow up on 03/22/2023.   Specialty: Cardiology Why: 2:45 for post cath follow up Contact information: [address] [place] [address] [phone]                Discharge Exam: Ceasar Mons Weights   03/17/23 0220 03/17/23 0848 03/18/23 0200  Weight: 91.5 kg 91.5 kg 91.4 kg   Physical Exam: Blood pressure 134/67,  pulse 64, temperature 98.5 F (36.9 C), temperature source Oral, resp. rate 19, height 5\' 6"  (1.676 m), weight 91.4 kg, last menstrual period 04/27/2017, SpO2 97 %. Gen: Well appearing female sitting up in bed requesting to go home, in [place] with attentive husband at bedside  Eyes: sclera anicteric, conjuctiva mildly injected bilaterally CVS: S1-S2, regulary, no gallops Respiratory:  decreased air entry likely secondary to decreased inspiratory effort GI: NABS, soft, NT  LE: No edema. No cyanosis Neuro: A/O x 3, Moving all extremities equally with normal strength, CN 3-12 intact, grossly nonfocal.  Psych: patient is logical and coherent, judgement and insight appear normal, mood and affect appropriate to situation. Skin: no rashes or lesions or ulcers,    Condition at discharge: stable  The results of significant diagnostics from this hospitalization (including imaging, microbiology, ancillary and laboratory) are listed  below for reference.   Imaging Studies: CARDIAC CATHETERIZATION  Result Date: 03/17/2023   Mid RCA lesion is 90% stenosed.  A drug-eluting stent was successfully placed using a SYNERGY XD 3.50X20, postdilated to greater than 3.75 mm and optimized with intravascular ultrasound.   Post intervention, there is a 0% residual stenosis.   Mid LAD-1 lesion is 90% stenosed.  A drug-eluting stent was successfully placed using a SYNERGY XD 2.75X16, postdilated to greater than 3 mm and optimized with intravascular ultrasound.   Post intervention, there is a 0% residual stenosis.   Mid LAD-2 lesion is 20% stenosed.   2nd Diag lesion is 35% stenosed.  Jailed diagonal maintained TIMI-3 flow.   LV end diastolic pressure is low.   There is no aortic valve stenosis. Successful two-vessel PCI of the RCA and LAD.  Continue dual antiplatelet therapy for 12 months.  If there are bleeding issues, could stop aspirin early.  Continue aggressive secondary prevention including diabetes control and  lipid-lowering therapy.  Mynx closure device was deployed.  Of note, the patient reported severe right arm pain during her diagnostic cath 2 days ago.  I suspect she had severe right radial spasm.  That is why we used the femoral approach. Results conveyed to her husband, Francee Piccolo.   CARDIAC CATHETERIZATION  Result Date: 03/15/2023   Mid LAD-1 lesion is 90% stenosed.   2nd Diag lesion is 35% stenosed.   Prox RCA lesion is 95% stenosed.   Mid LAD-2 lesion is 20% stenosed.   Non-stenotic Prox RCA to Mid RCA lesion.   The left ventricular systolic function is normal.   LV end diastolic pressure is mildly elevated.   The left ventricular ejection fraction is 55-65% by visual estimate. 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 Normal left circumflex coronary artery. Normal global LV function with EF estimate approximately 50 - 55%.  LVEDP mildly elevated at 21 mmHg. RECOMMENDATION: Consider surgical consultation in light of complex large LAD/diagonal bifurcation stenosis and high-grade large dominant RCA.  Will have colleagues review.  Will reinitiate heparin several hours post TR band removal.   ECHOCARDIOGRAM COMPLETE  Result Date: 03/15/2023    ECHOCARDIOGRAM REPORT   Patient Name:   ABERNATHY SAFFOLD Date of Exam: 03/15/2023 Medical Rec #:  [id number]     Height:       66.0 in Accession #:    [id number]    Weight:       205.7 lb Date of Birth:  01-27-1971     BSA:          2.024 m Patient Age:    51 years      BP:           130/79 mmHg Patient Gender: F             HR:           59 bpm. Exam Location:  Inpatient Procedure: 2D Echo, Color Doppler and Cardiac Doppler Indications:    NSTEMI  History:        Patient has no prior history of Echocardiogram examinations.                 Risk  Factors:Hypertension, Diabetes and Dyslipidemia.  Sonographer:    Irving Burton Senior RDCS Referring Phys: [id number] TIMOTHY S OPYD IMPRESSIONS  1. Left ventricular ejection fraction, by estimation, is 65 to 70%. The left ventricle has normal function. The left ventricle has no regional wall motion abnormalities. There is mild concentric left ventricular hypertrophy. Left ventricular diastolic parameters were normal.  2. Right ventricular systolic function is normal. The right ventricular size is mildly enlarged. Tricuspid regurgitation signal is inadequate for assessing PA pressure.  3. The mitral valve is normal in structure. Trivial mitral valve regurgitation.  4. The aortic valve is tricuspid. Aortic valve regurgitation is not visualized.  5. The inferior vena cava is normal in size with greater than 50% respiratory variability, suggesting right atrial pressure of 3 mmHg. FINDINGS  Left Ventricle: Left ventricular ejection fraction, by estimation, is 65 to 70%. The left ventricle has normal function. The left ventricle has no regional wall motion abnormalities. The left ventricular internal cavity size was normal in size. There is  mild concentric left ventricular hypertrophy. Left ventricular diastolic parameters were normal. Right Ventricle: The right ventricular size is mildly enlarged. No increase in right ventricular wall thickness. Right ventricular systolic function is normal. Tricuspid regurgitation signal is inadequate for assessing PA pressure. Left Atrium: Left atrial size was normal in size. Right Atrium: Right atrial size was normal in size. Pericardium: Trivial pericardial effusion is present. Mitral Valve: The mitral valve is normal in structure. Trivial mitral valve regurgitation. Tricuspid Valve: The tricuspid valve is normal in structure. Tricuspid valve regurgitation is trivial. Aortic Valve: The aortic valve is tricuspid. Aortic valve regurgitation is not visualized. Pulmonic Valve: The pulmonic valve  was normal in structure. Pulmonic valve regurgitation is trivial. Aorta: The aortic root and ascending aorta are structurally normal, with no evidence of dilitation. Venous: The inferior vena cava is normal in size with greater than 50% respiratory variability, suggesting right atrial pressure of 3 mmHg. IAS/Shunts: No atrial level shunt detected by color flow Doppler.  LEFT VENTRICLE PLAX 2D LVIDd:         4.80 cm   Diastology LVIDs:         2.60 cm   LV e' medial:    9.95 cm/s LV PW:         1.20 cm   LV E/e' medial:  8.0 LV IVS:        1.00 cm   LV e' lateral:   14.00 cm/s LVOT diam:     1.70 cm   LV E/e' lateral: 5.7 LV SV:         47 LV SV Index:   23 LVOT Area:     2.27 cm  RIGHT VENTRICLE RV S prime:     8.39 cm/s TAPSE (M-mode): 2.2 cm LEFT ATRIUM             Index        RIGHT ATRIUM           Index LA diam:        2.20 cm 1.09 cm/m   RA Area:     19.30 cm LA Vol (A2C):   36.9 ml 18.23 ml/m  RA Volume:   54.70 ml  27.03 ml/m LA Vol (A4C):   35.2 ml 17.39 ml/m LA Biplane Vol: 39.2 ml 19.37 ml/m  AORTIC VALVE LVOT Vmax:   99.40 cm/s LVOT Vmean:  69.400 cm/s LVOT VTI:    0.205 m  AORTA Ao Root diam: 3.10 cm Ao Asc diam:  2.80 cm MITRAL VALVE MV Area (PHT): 2.96 cm    SHUNTS MV Decel Time: 256 msec  Systemic VTI:  0.20 m MV E velocity: 79.20 cm/s  Systemic Diam: 1.70 cm MV A velocity: 69.70 cm/s MV E/A ratio:  1.14 Donato Schultz MD Electronically signed by Donato Schultz MD Signature Date/Time: 03/15/2023/11:04:05 AM    Final    CT Angio Chest PE W and/or Wo Contrast  Result Date: 03/14/2023 CLINICAL DATA:  Bilateral shoulder and neck pain with heaviness in chest. EXAM: CT ANGIOGRAPHY CHEST WITH CONTRAST TECHNIQUE: Multidetector CT imaging of the chest was performed using the standard protocol during bolus administration of intravenous contrast. Multiplanar CT image reconstructions and MIPs were obtained to evaluate the vascular anatomy. RADIATION DOSE REDUCTION: This exam was performed according to the  departmental dose-optimization program which includes automated exposure control, adjustment of the mA and/or kV according to patient size and/or use of iterative reconstruction technique. CONTRAST:  75mL OMNIPAQUE IOHEXOL 350 MG/ML SOLN COMPARISON:  Chest x-ray earlier 03/14/2023 FINDINGS: Cardiovascular: Heart is nonenlarged. No pericardial effusion. The thoracic aorta has a normal course and caliber minimal atherosclerotic change. No segmental or larger pulmonary embolism identified. Mediastinum/Nodes: No enlarged mediastinal, hilar, or axillary lymph nodes. Thyroid gland, trachea, and esophagus demonstrate no significant findings. Small hiatal hernia. Lungs/Pleura: There is some mild breathing motion identified. No consolidation, pneumothorax or effusion. Azygous fissure. Upper Abdomen: No acute abnormality.  Fatty liver infiltration. Musculoskeletal: Mild curvature and degenerative changes along the spine. Review of the MIP images confirms the above findings. IMPRESSION: No pulmonary embolism identified. Small hiatal hernia. Aortic Atherosclerosis (ICD10-I70.0). Electronically Signed   By: Karen Kays M.D.   On: 03/14/2023 17:39   DG Chest Portable 1 View  Result Date: 03/14/2023 CLINICAL DATA:  Neck and bilateral shoulder pain with chest heaviness for 2 days. EXAM: PORTABLE CHEST 1 VIEW COMPARISON:  None Available. FINDINGS: The heart size and mediastinal contours are normal. The lungs are clear. There is no pleural effusion or pneumothorax. No acute osseous findings are identified. There are degenerative changes in the thoracic spine. There are calcifications lateral to the left humeral head which could reflect hydroxyapatite deposition. IMPRESSION: No evidence of acute cardiopulmonary process. Possible hydroxyapatite deposition adjacent to the left humeral head. Electronically Signed   By: Carey Bullocks M.D.   On: 03/14/2023 15:30    Microbiology: Results for orders placed or performed in visit on  09/17/19  Novel Coronavirus, NAA ([facility])     Status: None   Collection Time: 09/17/19 12:00 AM   Specimen: Nasopharyngeal(NP) swabs in vial transport medium   NASOPHARYNGE  TESTING  Result Value Ref Range Status   SARS-CoV-2, NAA Not Detected Not Detected Final    Comment: This nucleic acid amplification test was developed and its performance characteristics determined by [employer]. Nucleic acid amplification tests include PCR and TMA. This test has not been FDA cleared or approved. This test has been authorized by FDA under an Emergency Use Authorization (EUA). This test is only authorized for the duration of time the declaration that circumstances exist justifying the authorization of the emergency use of in vitro diagnostic tests for detection of SARS-CoV-2 virus and/or diagnosis of COVID-19 infection under section 564(b)(1) of the Act, 21 U.S.C. 782NFA-2(Z) (1), unless the authorization is terminated or revoked sooner. When diagnostic testing is negative, the possibility of a false negative result should be considered in the context of a patient's recent exposures and the presence of clinical signs and symptoms consistent with COVID-19. An individual without symptoms of COVID-19 and who is not shedding SARS-CoV-2 virus would  expect to  have a negative (not detected) result in this assay.     Labs: CBC: No results for input(s): "WBC", "NEUTROABS", "HGB", "HCT", "MCV", "PLT" in the last 168 hours. Basic Metabolic Panel: No results for input(s): "NA", "K", "CL", "CO2", "GLUCOSE", "BUN", "CREATININE", "CALCIUM", "MG", "PHOS" in the last 168 hours. Liver Function Tests: No results for input(s): "AST", "ALT", "ALKPHOS", "BILITOT", "PROT", "ALBUMIN" in the last 168 hours. CBG: No results for input(s): "GLUCAP" in the last 168 hours.  Discharge time spent: greater than 30 minutes.  Signed: Pieter Partridge, MD [facility] 03/25/2023

## 2023-03-28 ENCOUNTER — Telehealth: Payer: Self-pay | Admitting: Internal Medicine

## 2023-03-28 ENCOUNTER — Telehealth (HOSPITAL_COMMUNITY): Payer: Self-pay

## 2023-03-28 NOTE — Telephone Encounter (Signed)
STAT if patient feels like he/she is going to faint   Are you dizzy now? No   Do you feel faint or have you passed out? No   Do you have any other symptoms? Low blood sugar, nausea, rapid HR, light headedness.   Have you checked your HR and BP (record if available)?    06/03 - BP 139/78 HR 63  Had stints put in two weeks ago.

## 2023-03-28 NOTE — Telephone Encounter (Signed)
Spoke with the patient who states that yesterday around 4pm she started to feel like her blood sugar was dropping. She reports feeling shaky and lightheaded. Her blood sugar was at 90. She sat down and ate a fruit snack to try and get her sugar up a little bit. She states that it came up to 93 about an hour later. She then developed some nausea, chills and tingling in her shoulder and head. She states that she continued to feel back throughout the evening, however blood sugar did eventually come up some more. She reports vital signs last night were stable with blood pressure 120-130s/70s and HR in the 60s. She denies any fever. She reports eating and drinking per her normal routine yesterday. She took all of her medications has prescribed. She denies having any shortness of breath or chest pain.  She states that she rested throughout the evening and slept last night. She states this morning she is feeling better. She called to make [place] aware of her episode last night. Advised to continue to monitor and let us know if any new or worsening symptoms return.

## 2023-03-28 NOTE — Telephone Encounter (Signed)
Called patient to see if she was interested in participating in the Cardiac Rehab Program. Patient stated yes. Patient will come in for orientation on 03/30/23 @ 8:30AM and will attend the 8:15AM exercise class.   Pensions consultant.

## 2023-03-29 ENCOUNTER — Telehealth (HOSPITAL_COMMUNITY): Payer: Self-pay

## 2023-03-29 NOTE — Telephone Encounter (Signed)
Reviewed with patient the Cardiac Rehab Cardiac Risk [place] Nursing Assessment. Patient knows office location, and to wear comfortable clothing/closed-toed shoes. Recommended to patient to eat, take medications, and if diabetic, to check blood sugar before coming to classes. Explained orientation may take 2 hours.  

## 2023-03-30 ENCOUNTER — Encounter (HOSPITAL_COMMUNITY)
Admission: RE | Admit: 2023-03-30 | Discharge: 2023-03-30 | Disposition: A | Discharge status: Home / Self Care | Payer: BC Managed Care – PPO | Source: Ambulatory Visit | Attending: Internal Medicine | Admitting: Internal Medicine

## 2023-03-30 VITALS — BP 98/60 | HR 65 | Ht 67.0 in | Wt 201.3 lb

## 2023-03-30 DIAGNOSIS — Z955 Presence of coronary angioplasty implant and graft: Secondary | ICD-10-CM | POA: Diagnosis present

## 2023-03-30 DIAGNOSIS — I214 Non-ST elevation (NSTEMI) myocardial infarction: Secondary | ICD-10-CM | POA: Insufficient documentation

## 2023-03-30 LAB — GLUCOSE, CAPILLARY: Glucose-Capillary: 144 mg/dL — ABNORMAL HIGH (ref 70–99)

## 2023-03-30 NOTE — Progress Notes (Signed)
Cardiac Rehab Medication Review   Does the patient  feel that his/her medications are working for him/her?  yes  Has the patient been experiencing any side effects to the medications prescribed?  yes  Does the patient measure his/her own blood pressure or blood glucose at home?  yes   Does the patient have any problems obtaining medications due to transportation or finances?   no  Understanding of regimen: excellent Understanding of indications: excellent Potential of compliance: excellent    Comments: Pt checks her blood glucose and blood pressure at home daily. Pt reports a low blood sugar last week which is a result of her diabetes medications.     Lorin Picket 03/30/2023 8:33 AM

## 2023-03-30 NOTE — Progress Notes (Signed)
Cardiac Individual Treatment Plan  Patient Details  Name: Angela Knight MRN: [id number] Date of Birth: 03-Dec-1970 Referring Provider:   Flowsheet Row INTENSIVE CARDIAC REHAB ORIENT from 03/30/2023 in 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 Swallow whole., Disp: 90 tablet, Rfl: 3   atorvastatin (LIPITOR) 80 MG tablet, Take 1 tablet (80 mg total) by mouth at bedtime., Disp: 30 tablet, Rfl: 0   famotidine-calcium carbonate-magnesium hydroxide (PEPCID COMPLETE) 10-800-165 MG chewable tablet, Chew 1 tablet by mouth every evening., Disp: , Rfl:    glipiZIDE (GLUCOTROL XL) 5 MG 24 hr tablet, Take 5 mg by mouth daily., Disp: , Rfl:    losartan (COZAAR) 25 MG tablet, Take 0.5 tablets by mouth daily., Disp: , Rfl:    metFORMIN (GLUCOPHAGE-XR) 500 MG 24 hr tablet, Take 500 mg by mouth 2 (two) times daily., Disp: , Rfl:    metoprolol tartrate (LOPRESSOR) 25 MG tablet, Take 0.5 tablets (12.5 mg total) by mouth 2 (two) times daily., Disp: 30 tablet, Rfl: 0   nitroGLYCERIN (NITROSTAT) 0.4 MG SL tablet, Place 1 tablet (0.4 mg total) under the tongue every 5 (five) minutes x 3 doses as needed for chest pain., Disp: 30 tablet, Rfl: 0   OMEPRAZOLE PO, Take 1 tablet by mouth daily. Pt unsure of strength, Disp: , Rfl:    ticagrelor (BRILINTA) 90 MG TABS tablet, Take 1 tablet (90 mg total) by mouth 2 (two) times daily., Disp: 60 tablet, Rfl: 0   estradiol (VIVELLE-DOT) 0.0375 MG/24HR, Place 1 patch  onto the skin 2 (two) times a week. (Patient not taking: Reported on 03/30/2023), Disp: , Rfl:    OZEMPIC, 0.25 OR 0.5 MG/DOSE, 2 MG/3ML SOPN, Inject 0.25 mg into the skin once a week. (Patient not taking: Reported on 03/22/2023), Disp: , Rfl:    Vibegron 75 MG TABS, Take 1 tablet by mouth daily. (Patient not taking: Reported on 03/22/2023), Disp: , Rfl:   Past Medical History: Past Medical History:  Diagnosis Date   Car sickness    Complication of anesthesia    severe   Family history of adverse reaction to anesthesia    PONV   GERD (gastroesophageal reflux disease)    occ. OTC med used   History of esophageal stricture    stenosis dilatation 05-02-2016   History of gastric polyp    benign 05-02-2016   PONV (postoperative nausea and vomiting)    SVD (spontaneous vaginal delivery)    x 6   Tubulovillous adenoma of rectum    multiple recurrent's   Wears glasses     Tobacco Use: Social History   Tobacco Use  Smoking Status Never  Smokeless Tobacco Never    Labs: Review Flowsheet       Latest Ref Rng & Units 03/15/2023  Labs for ITP Cardiac and Pulmonary Rehab  Cholestrol 0 - 200 mg/dL 440   LDL (calc) 0 - 99 mg/dL 87   HDL-C >34 mg/dL 43   Trlycerides <  150 mg/dL 161   Hemoglobin W9U 4.8 - 5.6 % 6.8     Capillary Blood Glucose: Lab Results  Component Value Date   GLUCAP 144 (H) 03/30/2023   GLUCAP 181 (H) 03/18/2023   GLUCAP 163 (H) 03/18/2023   GLUCAP 116 (H) 03/17/2023   GLUCAP 127 (H) 03/17/2023     Exercise Target Goals: Exercise Program Goal: Individual exercise prescription set using results from initial 6 min walk test and THRR while considering  patient's activity barriers and safety.   Exercise Prescription Goal: Initial exercise prescription builds to 30-45 minutes a day of aerobic activity, 2-3 days per week.  Home exercise guidelines will be given to patient during program as part of exercise prescription that the participant will  acknowledge.  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 HR 82 bpm     Max Ex. BP 110/72     2 Minute Post BP 104/62              Oxygen Initial Assessment:   Oxygen Re-Evaluation:   Oxygen Discharge (Final Oxygen Re-Evaluation):   Initial Exercise Prescription:  Initial Exercise Prescription - 03/30/23 0800       Date of Initial Exercise RX and Referring Provider   Date 03/30/23    Referring Provider Ruthe Mannan, MD    Expected Discharge Date 06/09/23      Recumbant Bike   Level 1    RPM 60    Watts 25    Minutes 15    METs 3.6      NuStep   Level 2    SPM 75    Minutes 15    METs 3.6      Prescription Details   Frequency (times per week) 3    Duration Progress to 30 minutes of continuous aerobic without signs/symptoms of physical distress      Intensity   THRR 40-80% of Max Heartrate 67-134    Ratings of Perceived Exertion 11-13    Perceived Dyspnea 0-4      Progression   Progression Continue progressive overload as per policy without signs/symptoms or physical distress.      Resistance Training   Training Prescription Yes     Weight  3 lbs    Reps 10-15             Perform Capillary Blood Glucose checks as needed.  Exercise Prescription Changes:   Exercise Comments:   Exercise Goals and Review:   Exercise Goals     Row Name 03/30/23 0825             Exercise Goals   Increase Physical Activity Yes       Intervention Provide advice, education, support and counseling about physical activity/exercise needs.;Develop an individualized exercise prescription for aerobic and resistive training based on initial evaluation findings, risk stratification, comorbidities and participant's personal goals.       Expected Outcomes Short Term: Attend rehab on a regular basis to increase amount of physical activity.;Long Term: Add in home exercise to make exercise part of routine and to increase amount of physical activity.;Long Term: Exercising regularly at least 3-5 days a week.       Increase Strength and Stamina Yes       Intervention Provide advice, education, support and counseling about physical activity/exercise needs.;Develop an individualized exercise prescription for aerobic and resistive training based on initial evaluation findings, risk stratification, comorbidities and participant's personal goals.       Expected Outcomes Short Term: Increase workloads from initial exercise prescription for resistance, speed, and METs.;Short Term: Perform resistance training exercises routinely during rehab and add in resistance training at home;Long Term: Improve cardiorespiratory fitness, muscular endurance and strength as measured by increased METs and functional capacity ( )       Able to understand and use rate of perceived exertion (RPE) scale Yes       Intervention Provide education and explanation on how to use RPE scale       Expected Outcomes Short Term: Able to use RPE daily in rehab to express subjective intensity level;Long Term:  Able to use RPE to guide intensity level when exercising independently        Knowledge and understanding of Target Heart Rate Range (THRR) Yes       Intervention Provide education and explanation of THRR including how the numbers were predicted and where they are located for reference       Expected Outcomes Short Term: Able to state/look up THRR;Short Term: Able to use daily as guideline for intensity in rehab;Long Term: Able to use THRR to govern intensity when exercising independently       Understanding of Exercise Prescription Yes       Intervention Provide education, explanation, and written materials on patient's individual exercise prescription       Expected Outcomes Short Term: Able to explain program exercise prescription;Long Term: Able to explain home exercise prescription to exercise independently                Exercise Goals Re-Evaluation :   Discharge Exercise Prescription (Final Exercise Prescription Changes):   Nutrition:  Target Goals: Understanding of nutrition guidelines, daily intake of sodium 1500mg , cholesterol 200mg , calories 30% from fat and 7% or less from saturated fats, daily to have 5 or more servings of fruits and vegetables.  Biometrics:  Pre Biometrics - 03/30/23 0900       Pre Biometrics   Waist Circumference 39.5 inches    Hip Circumference 43 inches    Waist to Hip Ratio 0.92 %    Triceps Skinfold 35 mm    % Body Fat 42.1 %    Grip Strength 35 kg    Flexibility 17.5 in  Single Leg Stand 30 seconds              Nutrition Therapy Plan and Nutrition Goals:   Nutrition Assessments:  MEDIFICTS Score Key: ?70 Need to make dietary changes  40-70 Heart Healthy Diet ? 40 Therapeutic Level Cholesterol Diet    Picture Your Plate Scores: <78 Unhealthy dietary pattern with much room for improvement. 41-50 Dietary pattern unlikely to meet recommendations for good health and room for improvement. 51-60 More healthful dietary pattern, with some room for improvement.  >60 Healthy dietary pattern, although there  may be some specific behaviors that could be improved.    Nutrition Goals Re-Evaluation:   Nutrition Goals Re-Evaluation:   Nutrition Goals Discharge (Final Nutrition Goals Re-Evaluation):   Psychosocial: Target Goals: Acknowledge presence or absence of significant depression and/or stress, maximize coping skills, provide positive support system. Participant is able to verbalize types and ability to use techniques and skills needed for reducing stress and depression.  Initial Review & Psychosocial Screening:  Initial Psych Review & Screening - 03/30/23 0840       Initial Review   Current issues with None Identified      Family Dynamics   Good Support System? Yes    Comments Pt has spouse, 6 sons, 5 siblings and her church family for support      Barriers   Psychosocial barriers to participate in program The patient should benefit from training in stress management and relaxation.      Screening Interventions   Interventions Encouraged to exercise             Quality of Life Scores:  Quality of Life - 03/30/23 1141       Quality of Life   Select Quality of Life      Quality of Life Scores   Health/Function Pre 21.43 %    Socioeconomic Pre 28.13 %    Psych/Spiritual Pre 23.21 %    Family Pre 27.6 %    GLOBAL Pre 24.2 %            Scores of 19 and below usually indicate a poorer quality of life in these areas.  A difference of  2-3 points is a clinically meaningful difference.  A difference of 2-3 points in the total score of the Quality of Life Index has been associated with significant improvement in overall quality of life, self-image, physical symptoms, and general health in studies assessing change in quality of life.  PHQ-9: Review Flowsheet       03/30/2023  Depression screen PHQ 2/9  Decreased Interest 0  Down, Depressed, Hopeless 1  PHQ - 2 Score 1  Altered sleeping 1  Tired, decreased energy 1  Change in appetite 0  Feeling bad or failure  about yourself  0  Trouble concentrating 0  Moving slowly or fidgety/restless 0  Suicidal thoughts 0  PHQ-9 Score 3  Difficult doing work/chores Somewhat difficult   Interpretation of Total Score  Total Score Depression Severity:  1-4 = Minimal depression, 5-9 = Mild depression, 10-14 = Moderate depression, 15-19 = Moderately severe depression, 20-27 = Severe depression   Psychosocial Evaluation and Intervention:   Psychosocial Re-Evaluation:   Psychosocial Discharge (Final Psychosocial Re-Evaluation):   Vocational Rehabilitation: Provide vocational rehab assistance to qualifying candidates.   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 Participant will state understanding/return demonstration of topics presented.     Core Videos: Exercise    Move It!  Clinical staff conducted group or individual video education with verbal and written material and guidebook.  Patient learns the recommended Pritikin exercise program. Exercise with the goal of living a long, healthy life. Some of the health benefits of exercise include controlled diabetes, healthier blood pressure levels, improved cholesterol levels, improved heart and lung capacity, improved sleep, and better body composition. Everyone should speak with their doctor before starting or changing an exercise routine.  Biomechanical Limitations Clinical staff conducted group or individual video education with verbal and written material and guidebook.  Patient learns how biomechanical limitations can impact exercise and how we can mitigate and possibly overcome limitations to have an impactful  and balanced exercise routine.  Body Composition Clinical staff conducted group or individual video education with verbal and written material and guidebook.  Patient learns that body composition (ratio of muscle mass to fat mass) is a key component to assessing overall fitness, rather than body weight alone. Increased fat mass, especially visceral belly fat, can put [place] at increased risk for metabolic syndrome, type 2 diabetes, heart disease, and even death. It is recommended to combine diet and exercise (cardiovascular and resistance training) to improve your body composition. Seek guidance from your physician and exercise physiologist before implementing an exercise routine.  Exercise Action Plan Clinical staff conducted group or individual video education with verbal and written material and guidebook.  Patient learns the recommended strategies to achieve and enjoy long-term exercise adherence, including variety, self-motivation, self-efficacy, and positive decision making. Benefits of exercise include fitness, good health, weight management, more energy, better sleep, less stress, and overall well-being.  Medical   Heart Disease Risk Reduction Clinical staff conducted group or individual video education with verbal and written material and guidebook.  Patient learns our heart is our most vital organ as it circulates oxygen, nutrients, white blood cells, and hormones throughout the entire body, and carries waste away. Data supports a plant-based eating plan like the Pritikin Program for its effectiveness in slowing progression of and reversing heart disease. The video provides a number of recommendations to address heart disease.   Metabolic Syndrome and Belly Fat  Clinical staff conducted group or individual video education with verbal and written material and guidebook.  Patient learns what metabolic syndrome is, how it leads to heart disease, and how one can reverse it and keep it from coming  back. You have metabolic syndrome if you have 3 of the following 5 criteria: abdominal obesity, high blood pressure, high triglycerides, low HDL cholesterol, and high blood sugar.  Hypertension and Heart Disease Clinical staff conducted group or individual video education with verbal and written material and guidebook.  Patient learns that high blood pressure, or hypertension, is very common in the [place]. Hypertension is largely due to excessive salt intake, but other important risk factors include being overweight, physical inactivity, drinking too much alcohol, smoking, and not eating enough potassium from fruits and vegetables. High blood pressure is a leading risk factor for heart attack, stroke, congestive heart failure, dementia, kidney failure, and premature death. Long-term effects of excessive salt intake include stiffening of the arteries and thickening of heart muscle and organ damage.  Recommendations include ways to reduce hypertension and the risk of heart disease.  Diseases of Our Time - Focusing on Diabetes Clinical staff conducted group or individual video education with verbal and written material and guidebook.  Patient learns why the best way to stop diseases of our time is prevention, through food and other lifestyle changes. Medicine (such as prescription pills and surgeries) is often only a Band-Aid on the problem, not a long-term solution. Most common diseases of our time include obesity, type 2 diabetes, hypertension, heart disease, and cancer. The Pritikin Program is recommended and has been proven to help reduce, reverse, and/or prevent the damaging effects of metabolic syndrome.  Nutrition   Overview of the Pritikin Eating Plan  Clinical staff conducted group or individual video education with verbal and written material and guidebook.  Patient learns about the Pritikin Eating Plan for disease risk reduction. The Pritikin Eating Plan emphasizes a wide variety of  unrefined, minimally-processed carbohydrates, like fruits, vegetables, whole grains, and legumes. Go, Caution, and Stop food choices are explained. Plant-based and lean animal proteins are emphasized. Rationale provided for low sodium intake for blood pressure control, low added sugars for blood sugar stabilization, and low added fats and oils for coronary artery disease risk reduction and weight management.  Calorie Density  Clinical staff conducted group or individual video education with verbal and written material and guidebook.  Patient learns about calorie density and how it impacts the Pritikin Eating Plan. Knowing the characteristics of the food you choose will help you decide whether those foods will lead to weight gain or weight loss, and whether you want to consume more or less of them. Weight loss is usually a side effect of the Pritikin Eating Plan because of its focus on low calorie-dense foods.  Label Reading  Clinical staff conducted group or individual video education with verbal and written material and guidebook.  Patient learns about the Pritikin recommended label reading guidelines and corresponding recommendations regarding calorie density, added sugars, sodium content, and whole grains.  Dining Out - Part 1  Clinical staff conducted group or individual video education with verbal and written material and guidebook.  Patient learns that restaurant meals can be sabotaging because they can be so high in calories, fat, sodium, and/or sugar. Patient learns recommended strategies on how to positively address this and avoid unhealthy pitfalls.  Facts on Fats  Clinical staff conducted group or individual video education with verbal and written material and guidebook.  Patient learns that lifestyle modifications can be just as effective, if not more so, as many medications for lowering your risk of heart disease. A Pritikin lifestyle can help to reduce your risk of inflammation and  atherosclerosis (cholesterol build-up, or plaque, in the artery walls). Lifestyle interventions such as dietary choices and physical activity address the cause of atherosclerosis. A review of the types of fats and their impact on blood cholesterol levels, along with dietary recommendations to reduce fat intake is also included.  Nutrition Action Plan  Clinical staff conducted group or individual video education with verbal and written material and guidebook.  Patient learns how to incorporate Pritikin recommendations into their lifestyle. Recommendations include planning and keeping personal health goals in mind as an important part of their success.  Healthy Mind-Set    Healthy Minds, Bodies, Hearts  Clinical staff conducted group or individual video education with verbal and written material and guidebook.  Patient learns how to identify when they are stressed. Video will discuss the impact of  that stress, as well as the many benefits of stress management. Patient will also be introduced to stress management techniques. The way we think, act, and feel has an impact on our hearts.  How Our Thoughts Can Heal Our Hearts  Clinical staff conducted group or individual video education with verbal and written material and guidebook.  Patient learns that negative thoughts can cause depression and anxiety. This can result in negative lifestyle behavior and serious health problems. Cognitive behavioral therapy is an effective method to help control our thoughts in order to change and improve our emotional outlook.  Additional Videos:  Exercise    Improving Performance  Clinical staff conducted group or individual video education with verbal and written material and guidebook.  Patient learns to use a non-linear approach by alternating intensity levels and lengths of time spent exercising to help burn more calories and lose more body fat. Cardiovascular exercise helps improve heart health, metabolism,  hormonal balance, blood sugar control, and recovery from fatigue. Resistance training improves strength, endurance, balance, coordination, reaction time, metabolism, and muscle mass. Flexibility exercise improves circulation, posture, and balance. Seek guidance from your physician and exercise physiologist before implementing an exercise routine and learn your capabilities and proper form for all exercise.  Introduction to Yoga  Clinical staff conducted group or individual video education with verbal and written material and guidebook.  Patient learns about yoga, a discipline of the coming together of mind, breath, and body. The benefits of yoga include improved flexibility, improved range of motion, better posture and core strength, increased lung function, weight loss, and positive self-image. Yoga's heart health benefits include lowered blood pressure, healthier heart rate, decreased cholesterol and triglyceride levels, improved immune function, and reduced stress. Seek guidance from your physician and exercise physiologist before implementing an exercise routine and learn your capabilities and proper form for all exercise.  Medical   Aging: Enhancing Your Quality of Life  Clinical staff conducted group or individual video education with verbal and written material and guidebook.  Patient learns key strategies and recommendations to stay in good physical health and enhance quality of life, such as prevention strategies, having an advocate, securing a Health Care Proxy and Power of Attorney, and keeping a list of medications and system for tracking them. It also discusses how to avoid risk for bone loss.  Biology of Weight Control  Clinical staff conducted group or individual video education with verbal and written material and guidebook.  Patient learns that weight gain occurs because we consume more calories than we burn (eating more, moving less). Even if your body weight is normal, you may have  higher ratios of fat compared to muscle mass. Too much body fat puts you at increased risk for cardiovascular disease, heart attack, stroke, type 2 diabetes, and obesity-related cancers. In addition to exercise, following the Pritikin Eating Plan can help reduce your risk.  Decoding Lab Results  Clinical staff conducted group or individual video education with verbal and written material and guidebook.  Patient learns that lab test reflects one measurement whose values change over time and are influenced by many factors, including medication, stress, sleep, exercise, food, hydration, pre-existing medical conditions, and more. It is recommended to use the knowledge from this video to become more involved with your lab results and evaluate your numbers to speak with your doctor.   Diseases of Our Time - Overview  Clinical staff conducted group or individual video education with verbal and written material and guidebook.  Patient learns  that according to the CDC, 50% to 70% of chronic diseases (such as obesity, type 2 diabetes, elevated lipids, hypertension, and heart disease) are avoidable through lifestyle improvements including healthier food choices, listening to satiety cues, and increased physical activity.  Sleep Disorders Clinical staff conducted group or individual video education with verbal and written material and guidebook.  Patient learns how good quality and duration of sleep are important to overall health and well-being. Patient also learns about sleep disorders and how they impact health along with recommendations to address them, including discussing with a physician.  Nutrition  Dining Out - Part 2 Clinical staff conducted group or individual video education with verbal and written material and guidebook.  Patient learns how to plan ahead and communicate in order to maximize their dining experience in a healthy and nutritious manner. Included are recommended food choices based on  the type of restaurant the patient is visiting.   Fueling a Banker conducted group or individual video education with verbal and written material and guidebook.  There is a strong connection between our food choices and our health. Diseases like obesity and type 2 diabetes are very prevalent and are in large-part due to lifestyle choices. The Pritikin Eating Plan provides plenty of food and hunger-curbing satisfaction. It is easy to follow, affordable, and helps reduce health risks.  Menu Workshop  Clinical staff conducted group or individual video education with verbal and written material and guidebook.  Patient learns that restaurant meals can sabotage health goals because they are often packed with calories, fat, sodium, and sugar. Recommendations include strategies to plan ahead and to communicate with the manager, chef, or server to help order a healthier meal.  Planning Your Eating Strategy  Clinical staff conducted group or individual video education with verbal and written material and guidebook.  Patient learns about the Pritikin Eating Plan and its benefit of reducing the risk of disease. The Pritikin Eating Plan does not focus on calories. Instead, it emphasizes high-quality, nutrient-rich foods. By knowing the characteristics of the foods, we choose, we can determine their calorie density and make informed decisions.  Targeting Your Nutrition Priorities  Clinical staff conducted group or individual video education with verbal and written material and guidebook.  Patient learns that lifestyle habits have a tremendous impact on disease risk and progression. This video provides eating and physical activity recommendations based on your personal health goals, such as reducing LDL cholesterol, losing weight, preventing or controlling type 2 diabetes, and reducing high blood pressure.  Vitamins and Minerals  Clinical staff conducted group or individual video education  with verbal and written material and guidebook.  Patient learns different ways to obtain key vitamins and minerals, including through a recommended healthy diet. It is important to discuss all supplements you take with your doctor.   Healthy Mind-Set    Smoking Cessation  Clinical staff conducted group or individual video education with verbal and written material and guidebook.  Patient learns that cigarette smoking and tobacco addiction pose a serious health risk which affects millions of people. Stopping smoking will significantly reduce the risk of heart disease, lung disease, and many forms of cancer. Recommended strategies for quitting are covered, including working with your doctor to develop a successful plan.  Culinary   Becoming a Set designer conducted group or individual video education with verbal and written material and guidebook.  Patient learns that cooking at home can be healthy, cost-effective, quick, and puts them  in control. Keys to cooking healthy recipes will include looking at your recipe, assessing your equipment needs, planning ahead, making it simple, choosing cost-effective seasonal ingredients, and limiting the use of added fats, salts, and sugars.  Cooking - Breakfast and Snacks  Clinical staff conducted group or individual video education with verbal and written material and guidebook.  Patient learns how important breakfast is to satiety and nutrition through the entire day. Recommendations include key foods to eat during breakfast to help stabilize blood sugar levels and to prevent overeating at meals later in the day. Planning ahead is also a key component.  Cooking - Educational psychologist conducted group or individual video education with verbal and written material and guidebook.  Patient learns eating strategies to improve overall health, including an approach to cook more at home. Recommendations include thinking of animal protein  as a side on your plate rather than center stage and focusing instead on lower calorie dense options like vegetables, fruits, whole grains, and plant-based proteins, such as beans. Making sauces in large quantities to freeze for later and leaving the skin on your vegetables are also recommended to maximize your experience.  Cooking - Healthy Salads and Dressing Clinical staff conducted group or individual video education with verbal and written material and guidebook.  Patient learns that vegetables, fruits, whole grains, and legumes are the foundations of the Pritikin Eating Plan. Recommendations include how to incorporate each of these in flavorful and healthy salads, and how to create homemade salad dressings. Proper handling of ingredients is also covered. Cooking - Soups and [employer] - Soups and Desserts Clinical staff conducted group or individual video education with verbal and written material and guidebook.  Patient learns that Pritikin soups and desserts make for easy, nutritious, and delicious snacks and meal components that are low in sodium, fat, sugar, and calorie density, while high in vitamins, minerals, and filling fiber. Recommendations include simple and healthy ideas for soups and desserts.   Overview     The Pritikin Solution Program Overview Clinical staff conducted group or individual video education with verbal and written material and guidebook.  Patient learns that the results of the Pritikin Program have been documented in more than 100 articles published in peer-reviewed journals, and the benefits include reducing risk factors for (and, in some cases, even reversing) high cholesterol, high blood pressure, type 2 diabetes, obesity, and more! An overview of the three key pillars of the Pritikin Program will be covered: eating well, doing regular exercise, and having a healthy mind-set.  WORKSHOPS  Exercise: Exercise Basics: Building Your Action Plan Clinical staff  led group instruction and group discussion with PowerPoint presentation and patient guidebook. To enhance the learning environment the use of posters, models and videos may be added. At the conclusion of this workshop, patients will comprehend the difference between physical activity and exercise, as well as the benefits of incorporating both, into their routine. Patients will understand the FITT (Frequency, Intensity, Time, and Type) principle and how to use it to build an exercise action plan. In addition, safety concerns and other considerations for exercise and cardiac rehab will be addressed by the presenter. The purpose of this lesson is to promote a comprehensive and effective weekly exercise routine in order to improve patients' overall level of fitness.   Managing Heart Disease: Your Path to a Healthier Heart Clinical staff led group instruction and group discussion with PowerPoint presentation and patient guidebook. To enhance the learning environment  the use of posters, models and videos may be added.At the conclusion of this workshop, patients will understand the anatomy and physiology of the heart. Additionally, they will understand how Pritikin's three pillars impact the risk factors, the progression, and the management of heart disease.  The purpose of this lesson is to provide a high-level overview of the heart, heart disease, and how the Pritikin lifestyle positively impacts risk factors.  Exercise Biomechanics Clinical staff led group instruction and group discussion with PowerPoint presentation and patient guidebook. To enhance the learning environment the use of posters, models and videos may be added. Patients will learn how the structural parts of their bodies function and how these functions impact their daily activities, movement, and exercise. Patients will learn how to promote a neutral spine, learn how to manage pain, and identify ways to improve their physical movement  in order to promote healthy living. The purpose of this lesson is to expose patients to common physical limitations that impact physical activity. Participants will learn practical ways to adapt and manage aches and pains, and to minimize their effect on regular exercise. Patients will learn how to maintain good posture while sitting, walking, and lifting.  Balance Training and Fall Prevention  Clinical staff led group instruction and group discussion with PowerPoint presentation and patient guidebook. To enhance the learning environment the use of posters, models and videos may be added. At the conclusion of this workshop, patients will understand the importance of their sensorimotor skills (vision, proprioception, and the vestibular system) in maintaining their ability to balance as they age. Patients will apply a variety of balancing exercises that are appropriate for their current level of function. Patients will understand the common causes for poor balance, possible solutions to these problems, and ways to modify their physical environment in order to minimize their fall risk. The purpose of this lesson is to teach patients about the importance of maintaining balance as they age and ways to minimize their risk of falling.  WORKSHOPS   Nutrition:  Fueling a Ship broker led group instruction and group discussion with PowerPoint presentation and patient guidebook. To enhance the learning environment the use of posters, models and videos may be added. Patients will review the foundational principles of the Pritikin Eating Plan and understand what constitutes a serving size in each of the food groups. Patients will also learn Pritikin-friendly foods that are better choices when away from home and review make-ahead meal and snack options. Calorie density will be reviewed and applied to three nutrition priorities: weight maintenance, weight loss, and weight gain. The purpose of this  lesson is to reinforce (in a group setting) the key concepts around what patients are recommended to eat and how to apply these guidelines when away from home by planning and selecting Pritikin-friendly options. Patients will understand how calorie density may be adjusted for different weight management goals.  Mindful Eating  Clinical staff led group instruction and group discussion with PowerPoint presentation and patient guidebook. To enhance the learning environment the use of posters, models and videos may be added. Patients will briefly review the concepts of the Pritikin Eating Plan and the importance of low-calorie dense foods. The concept of mindful eating will be introduced as well as the importance of paying attention to internal hunger signals. Triggers for non-hunger eating and techniques for dealing with triggers will be explored. The purpose of this lesson is to provide patients with the opportunity to review the basic principles of the Pritikin  Eating Plan, discuss the value of eating mindfully and how to measure internal cues of hunger and fullness using the Hunger Scale. Patients will also discuss reasons for non-hunger eating and learn strategies to use for controlling emotional eating.  Targeting Your Nutrition Priorities Clinical staff led group instruction and group discussion with PowerPoint presentation and patient guidebook. To enhance the learning environment the use of posters, models and videos may be added. Patients will learn how to determine their genetic susceptibility to disease by reviewing their family history. Patients will gain insight into the importance of diet as part of an overall healthy lifestyle in mitigating the impact of genetics and other environmental insults. The purpose of this lesson is to provide patients with the opportunity to assess their personal nutrition priorities by looking at their family history, their own health history and current risk factors.  Patients will also be able to discuss ways of prioritizing and modifying the Pritikin Eating Plan for their highest risk areas  Menu  Clinical staff led group instruction and group discussion with PowerPoint presentation and patient guidebook. To enhance the learning environment the use of posters, models and videos may be added. Using menus brought in from [employer], or printed from [employer], patients will apply the Pritikin dining out guidelines that were presented in the [employer] video. Patients will also be able to practice these guidelines in a variety of provided scenarios. The purpose of this lesson is to provide patients with the opportunity to practice hands-on learning of the Pritikin Dining Out guidelines with actual menus and practice scenarios.  Label Reading Clinical staff led group instruction and group discussion with PowerPoint presentation and patient guidebook. To enhance the learning environment the use of posters, models and videos may be added. Patients will review and discuss the Pritikin label reading guidelines presented in Pritikin's Label Reading Educational series video. Using fool labels brought in from local grocery stores and markets, patients will apply the label reading guidelines and determine if the packaged food meet the Pritikin guidelines. The purpose of this lesson is to provide patients with the opportunity to review, discuss, and practice hands-on learning of the Pritikin Label Reading guidelines with actual packaged food labels. Cooking School  Pritikin's [employer] are designed to teach patients ways to prepare quick, simple, and affordable recipes at home. The importance of nutrition's role in chronic disease risk reduction is reflected in its emphasis in the overall Pritikin program. By learning how to prepare essential core Pritikin Eating Plan recipes, patients will increase control over what they eat; be able to  customize the flavor of foods without the use of added salt, sugar, or fat; and improve the quality of the food they consume. By learning a set of core recipes which are easily assembled, quickly prepared, and affordable, patients are more likely to prepare more healthy foods at home. These workshops focus on convenient breakfasts, simple entres, side dishes, and desserts which can be prepared with minimal effort and are consistent with nutrition recommendations for cardiovascular risk reduction. Cooking [employer] are taught by a Armed forces logistics/support/administrative officer (RD) who has been trained by the [employer]. The chef or RD has a clear understanding of the importance of minimizing - if not completely eliminating - added fat, sugar, and sodium in recipes. Throughout the series of Cooking School Workshop sessions, patients will learn about healthy ingredients and efficient methods of cooking to build confidence in their capability to prepare  Cooking School weekly topics:  Adding Flavor- Sodium-Free  Fast and Healthy Breakfasts  Powerhouse Plant-Based Proteins  Satisfying Salads and Dressings  Simple Sides and Sauces  International Cuisine-Spotlight on the [employer] Zones  Delicious Desserts  Savory Soups  Efficiency Cooking - Meals in a Snap  Tasty Appetizers and Snacks  Comforting Weekend Breakfasts  One-Pot Wonders   Fast Evening Meals  Landscape architect Your Pritikin Plate  WORKSHOPS   Healthy Mindset (Psychosocial):  Focused Goals, Sustainable Changes Clinical staff led group instruction and group discussion with PowerPoint presentation and patient guidebook. To enhance the learning environment the use of posters, models and videos may be added. Patients will be able to apply effective goal setting strategies to establish at least one personal goal, and then take consistent, meaningful action toward that goal. They will learn to identify common barriers to  achieving personal goals and develop strategies to overcome them. Patients will also gain an understanding of how our mind-set can impact our ability to achieve goals and the importance of cultivating a positive and growth-oriented mind-set. The purpose of this lesson is to provide patients with a deeper understanding of how to set and achieve personal goals, as well as the tools and strategies needed to overcome common obstacles which may arise along the way.  From Head to Heart: The Power of a Healthy Outlook  Clinical staff led group instruction and group discussion with PowerPoint presentation and patient guidebook. To enhance the learning environment the use of posters, models and videos may be added. Patients will be able to recognize and describe the impact of emotions and mood on physical health. They will discover the importance of self-care and explore self-care practices which may work for them. Patients will also learn how to utilize the 4 C's to cultivate a healthier outlook and better manage stress and challenges. The purpose of this lesson is to demonstrate to patients how a healthy outlook is an essential part of maintaining good health, especially as they continue their cardiac rehab journey.  Healthy Sleep for a Healthy Heart Clinical staff led group instruction and group discussion with PowerPoint presentation and patient guidebook. To enhance the learning environment the use of posters, models and videos may be added. At the conclusion of this workshop, patients will be able to demonstrate knowledge of the importance of sleep to overall health, well-being, and quality of life. They will understand the symptoms of, and treatments for, common sleep disorders. Patients will also be able to identify daytime and nighttime behaviors which impact sleep, and they will be able to apply these tools to help manage sleep-related challenges. The purpose of this lesson is to provide patients with a  general overview of sleep and outline the importance of quality sleep. Patients will learn about a few of the most common sleep disorders. Patients will also be introduced to the concept of "sleep hygiene," and discover ways to self-manage certain sleeping problems through simple daily behavior changes. Finally, the workshop will motivate patients by clarifying the links between quality sleep and their goals of heart-healthy living.   Recognizing and Reducing Stress Clinical staff led group instruction and group discussion with PowerPoint presentation and patient guidebook. To enhance the learning environment the use of posters, models and videos may be added. At the conclusion of this workshop, patients will be able to understand the types of stress reactions, differentiate between acute and chronic stress, and recognize the impact that chronic stress has on their health. They will  also be able to apply different coping mechanisms, such as reframing negative self-talk. Patients will have the opportunity to practice a variety of stress management techniques, such as deep abdominal breathing, progressive muscle relaxation, and/or guided imagery.  The purpose of this lesson is to educate patients on the role of stress in their lives and to provide healthy techniques for coping with it.  Learning Barriers/Preferences:  Learning Barriers/Preferences - 03/30/23 1336       Learning Barriers/Preferences   Learning Barriers Sight   wears contacts/glasses   Learning Preferences Audio;Verbal Instruction;Computer/Internet;Video;Group Instruction;Written Material;Individual Instruction;Pictoral;Skilled Demonstration             Education Topics:  Knowledge Questionnaire Score:  Knowledge Questionnaire Score - 03/30/23 1142       Knowledge Questionnaire Score   Pre Score 23/24             Core Components/Risk Factors/Patient Goals at Admission:  Personal Goals and Risk Factors at Admission -  03/30/23 1336       Core Components/Risk Factors/Patient Goals on Admission   Diabetes Yes    Intervention Provide education about signs/symptoms and action to take for hypo/hyperglycemia.;Provide education about proper nutrition, including hydration, and aerobic/resistive exercise prescription along with prescribed medications to achieve blood glucose in normal ranges: Fasting glucose 65-99 mg/dL    Expected Outcomes Short Term: Participant verbalizes understanding of the signs/symptoms and immediate care of hyper/hypoglycemia, proper foot care and importance of medication, aerobic/resistive exercise and nutrition plan for blood glucose control.;Long Term: Attainment of HbA1C < 7%.    Intervention Provide education on lifestyle modifcations including regular physical activity/exercise, weight management, moderate sodium restriction and increased consumption of fresh fruit, vegetables, and low fat dairy, alcohol moderation, and smoking cessation.;Monitor prescription use compliance.    Expected Outcomes Short Term: Continued assessment and intervention until BP is < 140/33mm HG in hypertensive participants. < 130/47mm HG in hypertensive participants with diabetes, heart failure or chronic kidney disease.;Long Term: Maintenance of blood pressure at goal levels.    Lipids Yes    Intervention Provide education and support for participant on nutrition & aerobic/resistive exercise along with prescribed medications to achieve LDL 70mg , HDL >40mg .    Expected Outcomes Short Term: Participant states understanding of desired cholesterol values and is compliant with medications prescribed. Participant is following exercise prescription and nutrition guidelines.;Long Term: Cholesterol controlled with medications as prescribed, with individualized exercise RX and with personalized nutrition plan. Value goals: LDL < 70mg , HDL > 40 mg.    Stress Yes    Intervention Offer individual and/or small group education and  counseling on adjustment to heart disease, stress management and health-related lifestyle change. Teach and support self-help strategies.;Refer participants experiencing significant psychosocial distress to appropriate mental health specialists for further evaluation and treatment. When possible, include family members and significant others in education/counseling sessions.    Expected Outcomes Short Term: Participant demonstrates changes in health-related behavior, relaxation and other stress management skills, ability to obtain effective social support, and compliance with psychotropic medications if prescribed.;Long Term: Emotional wellbeing is indicated by absence of clinically significant psychosocial distress or social isolation.             Core Components/Risk Factors/Patient Goals Review:    Core Components/Risk Factors/Patient Goals at Discharge (Final Review):    ITP Comments:  ITP Comments     Row Name 03/30/23 0823           ITP Comments Dr. Armanda Magic medical director. Introduction to pritikin education/ intensive cardiac  rehab. Initial orientation packet reviewed with patient.                Comments: Participant attended orientation for the cardiac rehabilitation program on  03/30/2023  to perform initial intake and exercise walk test. Patient introduced to the Pritikin Program education and orientation packet was reviewed. Completed 6-minute walk test, measurements, initial ITP, and exercise prescription. Vital signs stable. Telemetry-Sinus bradycardia normal sinus rhythm, asymptomatic.   Service time was from 8:15 to 10:17.

## 2023-03-31 ENCOUNTER — Emergency Department (HOSPITAL_COMMUNITY): Payer: BC Managed Care – PPO

## 2023-03-31 ENCOUNTER — Other Ambulatory Visit: Payer: Self-pay

## 2023-03-31 ENCOUNTER — Emergency Department (HOSPITAL_COMMUNITY)
Admission: EM | Admit: 2023-03-31 | Discharge: 2023-04-01 | Disposition: A | Discharge status: Home / Self Care | Payer: BC Managed Care – PPO | Attending: Emergency Medicine | Admitting: Emergency Medicine

## 2023-03-31 DIAGNOSIS — R202 Paresthesia of skin: Secondary | ICD-10-CM | POA: Diagnosis present

## 2023-03-31 DIAGNOSIS — Z7901 Long term (current) use of anticoagulants: Secondary | ICD-10-CM | POA: Diagnosis not present

## 2023-03-31 DIAGNOSIS — I2510 Atherosclerotic heart disease of native coronary artery without angina pectoris: Secondary | ICD-10-CM | POA: Insufficient documentation

## 2023-03-31 DIAGNOSIS — M25519 Pain in unspecified shoulder: Secondary | ICD-10-CM

## 2023-03-31 LAB — BASIC METABOLIC PANEL
Anion gap: 12 (ref 5–15)
BUN: 12 mg/dL (ref 6–20)
CO2: 21 mmol/L — ABNORMAL LOW (ref 22–32)
Calcium: 10.7 mg/dL — ABNORMAL HIGH (ref 8.9–10.3)
Chloride: 105 mmol/L (ref 98–111)
Creatinine, Ser: 0.88 mg/dL (ref 0.44–1.00)
GFR, Estimated: >60 mL/min (ref >60)
Glucose, Bld: 161 mg/dL — ABNORMAL HIGH (ref 70–99)
Potassium: 3.9 mmol/L (ref 3.5–5.1)
Sodium: 138 mmol/L (ref 135–145)

## 2023-03-31 LAB — CBC
HCT: 40.2 % (ref 36.0–46.0)
Hemoglobin: 14.5 g/dL (ref 12.0–15.0)
MCH: 31.5 pg (ref 26.0–34.0)
MCHC: 36.1 g/dL — ABNORMAL HIGH (ref 30.0–36.0)
MCV: 87.2 fL (ref 80.0–100.0)
Platelets: 360 10*3/uL (ref 150–400)
RBC: 4.61 MIL/uL (ref 3.87–5.11)
RDW: 13.2 % (ref 11.5–15.5)
WBC: 11.1 10*3/uL — ABNORMAL HIGH (ref 4.0–10.5)
nRBC: 0 % (ref 0.0–0.2)

## 2023-03-31 LAB — PROTIME-INR
INR: 1 (ref 0.8–1.2)
Prothrombin Time: 13.3 seconds (ref 11.4–15.2)

## 2023-03-31 LAB — TROPONIN I (HIGH SENSITIVITY): Troponin I (High Sensitivity): 3 ng/L (ref <18)

## 2023-03-31 NOTE — ED Triage Notes (Addendum)
Patient reports persistent pain at left lateral neck radiating to left shoulder blade this evening " tingling" with mild SOB , dizziness and lightheadedness . History of CAD with coronary stents her cardiologist is Dr. Isabel Caprice.

## 2023-04-01 LAB — TROPONIN I (HIGH SENSITIVITY): Troponin I (High Sensitivity): 3 ng/L (ref <18)

## 2023-04-01 NOTE — ED Provider Notes (Signed)
[place] EMERGENCY DEPARTMENT AT [facility] Provider Note   CSN: [id number] Arrival date & time: 03/31/23  2036     History  Chief Complaint  Patient presents with   Left Neck/Shoulder Pain ( Hx. CAD/Stents)     Angela Knight is a 52 y.o. female.  The history is provided by the patient.  Shoulder Pain Location:  Shoulder (and neck) Shoulder location:  L shoulder Injury: no   Pain details:    Quality: tingling.   Radiates to: neck.   Severity:  Moderate   Onset quality:  Sudden   Duration:  6 hours   Timing:  Constant   Progression:  Unchanged Relieved by:  Nothing Worsened by:  Nothing Ineffective treatments:  Acetaminophen Associated symptoms: neck pain   Associated symptoms: no fever and no swelling   Risk factors: no concern for non-accidental trauma   Patient with CAD with 2 episodes of tingling in the shoulder and posterior neck.  Patient had an NSTEMI with stenting.  She was concerned it might be related to glucose.  She spoke to the cardiac nurse who did not provide an answer.  Today's episode started at rest at 6 pm.  It worsened 2 hours later.  No exertional symptoms.       Home Medications Prior to Admission medications   Medication Sig Start Date End Date Taking? Authorizing Provider  aspirin EC 81 MG tablet Take 1 tablet (81 mg total) by mouth daily. Swallow whole. 03/22/23   Swinyer, Zachary George, NP  atorvastatin (LIPITOR) 80 MG tablet Take 1 tablet (80 mg total) by mouth at bedtime. 03/18/23 04/17/23  Pieter Partridge, MD  estradiol (VIVELLE-DOT) 0.0375 MG/24HR Place 1 patch onto the skin 2 (two) times a week. Patient not taking: Reported on 03/30/2023 01/12/23 01/12/24  [provider]  famotidine-calcium carbonate-magnesium hydroxide (PEPCID COMPLETE) 10-800-165 MG chewable tablet Chew 1 tablet by mouth every evening.    [provider]  glipiZIDE (GLUCOTROL XL) 5 MG 24 hr tablet Take 5 mg by mouth daily.    [provider]  losartan (COZAAR) 25 MG tablet Take 0.5 tablets by mouth daily. 02/01/23 02/01/24  [provider]  metFORMIN (GLUCOPHAGE-XR) 500 MG 24 hr tablet Take 500 mg by mouth 2 (two) times daily.    [provider]  metoprolol tartrate (LOPRESSOR) 25 MG tablet Take 0.5 tablets (12.5 mg total) by mouth 2 (two) times daily. 03/18/23 04/17/23  Pieter Partridge, MD  nitroGLYCERIN (NITROSTAT) 0.4 MG SL tablet Place 1 tablet (0.4 mg total) under the tongue every 5 (five) minutes x 3 doses as needed for chest pain. 03/18/23   Pieter Partridge, MD  OMEPRAZOLE PO Take 1 tablet by mouth daily. Pt unsure of strength    [provider]  OZEMPIC, 0.25 OR 0.5 MG/DOSE, 2 MG/3ML SOPN Inject 0.25 mg into the skin once a week. Patient not taking: Reported on 03/22/2023 03/01/23   [provider]  ticagrelor (BRILINTA) 90 MG TABS tablet Take 1 tablet (90 mg total) by mouth 2 (two) times daily. 03/18/23 04/17/23  Pieter Partridge, MD  Vibegron 75 MG TABS Take 1 tablet by mouth daily. Patient not taking: Reported on 03/22/2023 01/12/23   [provider]      Allergies    Codeine, Coffee bean extract [coffea arabica], Other, Lisinopril, Pseudoephedrine, and Surgical lubricant    Review of Systems   Review of Systems  Constitutional:  Negative for fever.  HENT:  Negative for ear discharge.   Eyes:  Negative for photophobia.  Respiratory:  Negative for wheezing and stridor.   Cardiovascular:  Negative for chest pain.  Musculoskeletal:  Positive for arthralgias and neck pain.  Neurological:  Negative for speech difficulty and weakness.  All other systems reviewed and are negative.   Physical Exam Updated Vital Signs BP (!) 140/75 (BP Location: Right Arm)   Pulse 68   Temp 98 F (36.7 C) (Oral)   Resp 14   Ht 5\' 6"  (1.676 m)   Wt 90.7 kg   LMP 04/27/2017 (Approximate)   SpO2 100%   BMI 32.28 kg/m  Physical Exam Vitals and nursing  note reviewed.  Constitutional:      General: She is not in acute distress.    Appearance: Normal appearance. She is well-developed.  HENT:     Head: Normocephalic and atraumatic.     Nose: Nose normal.  Eyes:     Extraocular Movements: Extraocular movements intact.     Pupils: Pupils are equal, round, and reactive to light.  Cardiovascular:     Rate and Rhythm: Normal rate and regular rhythm.     Pulses: Normal pulses.     Heart sounds: Normal heart sounds.  Pulmonary:     Effort: Pulmonary effort is normal. No respiratory distress.     Breath sounds: Normal breath sounds.  Abdominal:     General: Bowel sounds are normal. There is no distension.     Palpations: Abdomen is soft.     Tenderness: There is no abdominal tenderness. There is no guarding or rebound.  Genitourinary:    Vagina: No vaginal discharge.  Musculoskeletal:        General: Normal range of motion.     Right shoulder: Normal.     Left shoulder: Normal.     Cervical back: Normal, normal range of motion and neck supple.     Thoracic back: Normal.     Lumbar back: Normal.     Right lower leg: No edema.     Left lower leg: No edema.     Comments: Negative NEERS test of B shoulder   Skin:    General: Skin is warm and dry.     Capillary Refill: Capillary refill takes less than 2 seconds.     Findings: No erythema or rash.  Neurological:     General: No focal deficit present.     Mental Status: She is alert and oriented to person, place, and time.     Deep Tendon Reflexes: Reflexes normal.     Comments: 5/5 throughout   Psychiatric:        Thought Content: Thought content normal.     ED Results / Procedures / Treatments   Labs (all labs ordered are listed, but only abnormal results are displayed) Results for orders placed or performed during the hospital encounter of 03/31/23  Basic metabolic panel  Result Value Ref Range   Sodium 138 135 - 145 mmol/L   Potassium 3.9 3.5 - 5.1 mmol/L   Chloride 105 98  - 111 mmol/L   CO2 21 (L) 22 - 32 mmol/L   Glucose, Bld 161 (H) 70 - 99 mg/dL   BUN 12 6 - 20 mg/dL   Creatinine, Ser 4.40 0.44 - 1.00 mg/dL   Calcium 10.2 (H) 8.9 - 10.3 mg/dL   GFR, Estimated >72 >53 mL/min   Anion gap 12 5 - 15  CBC  Result Value Ref Range   WBC  11.1 (H) 4.0 - 10.5 K/uL   RBC 4.61 3.87 - 5.11 MIL/uL   Hemoglobin 14.5 12.0 - 15.0 g/dL   HCT 16.1 09.6 - 04.5 %   MCV 87.2 80.0 - 100.0 fL   MCH 31.5 26.0 - 34.0 pg   MCHC 36.1 (H) 30.0 - 36.0 g/dL   RDW 40.9 81.1 - 91.4 %   Platelets 360 150 - 400 K/uL   nRBC 0.0 0.0 - 0.2 %  Protime-INR (order if Patient is taking Coumadin / Warfarin)  Result Value Ref Range   Prothrombin Time 13.3 11.4 - 15.2 seconds   INR 1.0 0.8 - 1.2  Troponin I (High Sensitivity)  Result Value Ref Range   Troponin I (High Sensitivity) 3 <18 ng/L  Troponin I (High Sensitivity)  Result Value Ref Range   Troponin I (High Sensitivity) 3 <18 ng/L   DG Chest 2 View  Result Date: 03/31/2023 CLINICAL DATA:  Left-sided neck pain. EXAM: CHEST - 2 VIEW COMPARISON:  Chest radiograph and CTA 03/14/2023 FINDINGS: The cardiomediastinal contours are normal. Again seen azygous fissure. The lungs are clear. Pulmonary vasculature is normal. No consolidation, pleural effusion, or pneumothorax. No acute osseous abnormalities are seen. IMPRESSION: No acute chest findings. Electronically Signed   By: Narda Rutherford M.D.   On: 03/31/2023 21:18   CARDIAC CATHETERIZATION  Result Date: 03/17/2023   Mid RCA lesion is 90% stenosed.  A drug-eluting stent was successfully placed using a SYNERGY XD 3.50X20, postdilated to greater than 3.75 mm and optimized with intravascular ultrasound.   Post intervention, there is a 0% residual stenosis.   Mid LAD-1 lesion is 90% stenosed.  A drug-eluting stent was successfully placed using a SYNERGY XD 2.75X16, postdilated to greater than 3 mm and optimized with intravascular ultrasound.   Post intervention, there is a 0% residual  stenosis.   Mid LAD-2 lesion is 20% stenosed.   2nd Diag lesion is 35% stenosed.  Jailed diagonal maintained TIMI-3 flow.   LV end diastolic pressure is low.   There is no aortic valve stenosis. Successful two-vessel PCI of the RCA and LAD.  Continue dual antiplatelet therapy for 12 months.  If there are bleeding issues, could stop aspirin early.  Continue aggressive secondary prevention including diabetes control and lipid-lowering therapy.  Mynx closure device was deployed.  Of note, the patient reported severe right arm pain during her diagnostic cath 2 days ago.  I suspect she had severe right radial spasm.  That is why we used the femoral approach. Results conveyed to her husband, Francee Piccolo.   CARDIAC CATHETERIZATION  Result Date: 03/15/2023   Mid LAD-1 lesion is 90% stenosed.   2nd Diag lesion is 35% stenosed.   Prox RCA lesion is 95% stenosed.   Mid LAD-2 lesion is 20% stenosed.   Non-stenotic Prox RCA to Mid RCA lesion.   The left ventricular systolic function is normal.   LV end diastolic pressure is mildly elevated.   The left ventricular ejection fraction is 55-65% by visual estimate. 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 Normal left circumflex coronary artery. Normal global LV function with EF estimate approximately 50 - 55%.  LVEDP mildly elevated at 21 mmHg. RECOMMENDATION: Consider surgical consultation in light of complex large LAD/diagonal bifurcation stenosis and high-grade large dominant RCA.  Will  have colleagues review.  Will reinitiate heparin several hours post TR band removal.   ECHOCARDIOGRAM COMPLETE  Result Date: 03/15/2023    ECHOCARDIOGRAM REPORT   Patient Name:   AUBRIANA SARAH Date of Exam: 03/15/2023 Medical Rec #:  [id number]     Height:       66.0 in Accession #:     [id number]    Weight:       205.7 lb Date of Birth:  22-Nov-1970     BSA:          2.024 m Patient Age:    51 years      BP:           130/79 mmHg Patient Gender: F             HR:           59 bpm. Exam Location:  Inpatient Procedure: 2D Echo, Color Doppler and Cardiac Doppler Indications:    NSTEMI  History:        Patient has no prior history of Echocardiogram examinations.                 Risk Factors:Hypertension, Diabetes and Dyslipidemia.  Sonographer:    Irving Burton Senior RDCS Referring Phys: [id number] TIMOTHY S OPYD IMPRESSIONS  1. Left ventricular ejection fraction, by estimation, is 65 to 70%. The left ventricle has normal function. The left ventricle has no regional wall motion abnormalities. There is mild concentric left ventricular hypertrophy. Left ventricular diastolic parameters were normal.  2. Right ventricular systolic function is normal. The right ventricular size is mildly enlarged. Tricuspid regurgitation signal is inadequate for assessing PA pressure.  3. The mitral valve is normal in structure. Trivial mitral valve regurgitation.  4. The aortic valve is tricuspid. Aortic valve regurgitation is not visualized.  5. The inferior vena cava is normal in size with greater than 50% respiratory variability, suggesting right atrial pressure of 3 mmHg. FINDINGS  Left Ventricle: Left ventricular ejection fraction, by estimation, is 65 to 70%. The left ventricle has normal function. The left ventricle has no regional wall motion abnormalities. The left ventricular internal cavity size was normal in size. There is  mild concentric left ventricular hypertrophy. Left ventricular diastolic parameters were normal. Right Ventricle: The right ventricular size is mildly enlarged. No increase in right ventricular wall thickness. Right ventricular systolic function is normal. Tricuspid regurgitation signal is inadequate for assessing PA pressure. Left Atrium: Left atrial size was normal in size. Right Atrium: Right  atrial size was normal in size. Pericardium: Trivial pericardial effusion is present. Mitral Valve: The mitral valve is normal in structure. Trivial mitral valve regurgitation. Tricuspid Valve: The tricuspid valve is normal in structure. Tricuspid valve regurgitation is trivial. Aortic Valve: The aortic valve is tricuspid. Aortic valve regurgitation is not visualized. Pulmonic Valve: The pulmonic valve was normal in structure. Pulmonic valve regurgitation is trivial. Aorta: The aortic root and ascending aorta are structurally normal, with no evidence of dilitation. Venous: The inferior vena cava is normal in size with greater than 50% respiratory variability, suggesting right atrial pressure of 3 mmHg. IAS/Shunts: No atrial level shunt detected by color flow Doppler.  LEFT VENTRICLE PLAX 2D LVIDd:         4.80 cm   Diastology LVIDs:         2.60 cm   LV e' medial:    9.95 cm/s LV PW:         1.20 cm   LV E/e' medial:  8.0 LV IVS:        1.00 cm   LV e' lateral:   14.00 cm/s LVOT diam:     1.70 cm   LV E/e' lateral: 5.7 LV SV:         47 LV SV Index:   23 LVOT Area:     2.27 cm  RIGHT VENTRICLE RV S prime:     8.39 cm/s TAPSE (M-mode): 2.2 cm LEFT ATRIUM             Index        RIGHT ATRIUM           Index LA diam:        2.20 cm 1.09 cm/m   RA Area:     19.30 cm LA Vol (A2C):   36.9 ml 18.23 ml/m  RA Volume:   54.70 ml  27.03 ml/m LA Vol (A4C):   35.2 ml 17.39 ml/m LA Biplane Vol: 39.2 ml 19.37 ml/m  AORTIC VALVE LVOT Vmax:   99.40 cm/s LVOT Vmean:  69.400 cm/s LVOT VTI:    0.205 m  AORTA Ao Root diam: 3.10 cm Ao Asc diam:  2.80 cm MITRAL VALVE MV Area (PHT): 2.96 cm    SHUNTS MV Decel Time: 256 msec    Systemic VTI:  0.20 m MV E velocity: 79.20 cm/s  Systemic Diam: 1.70 cm MV A velocity: 69.70 cm/s MV E/A ratio:  1.14 Donato Schultz MD Electronically signed by Donato Schultz MD Signature Date/Time: 03/15/2023/11:04:05 AM    Final    CT Angio Chest PE W and/or Wo Contrast  Result Date: 03/14/2023 CLINICAL  DATA:  Bilateral shoulder and neck pain with heaviness in chest. EXAM: CT ANGIOGRAPHY CHEST WITH CONTRAST TECHNIQUE: Multidetector CT imaging of the chest was performed using the standard protocol during bolus administration of intravenous contrast. Multiplanar CT image reconstructions and MIPs were obtained to evaluate the vascular anatomy. RADIATION DOSE REDUCTION: This exam was performed according to the departmental dose-optimization program which includes automated exposure control, adjustment of the mA and/or kV according to patient size and/or use of iterative reconstruction technique. CONTRAST:  75mL OMNIPAQUE IOHEXOL 350 MG/ML SOLN COMPARISON:  Chest x-ray earlier 03/14/2023 FINDINGS: Cardiovascular: Heart is nonenlarged. No pericardial effusion. The thoracic aorta has a normal course and caliber minimal atherosclerotic change. No segmental or larger pulmonary embolism identified. Mediastinum/Nodes: No enlarged mediastinal, hilar, or axillary lymph nodes. Thyroid gland, trachea, and esophagus demonstrate no significant findings. Small hiatal hernia. Lungs/Pleura: There is some mild breathing motion identified. No consolidation, pneumothorax or effusion. Azygous fissure. Upper Abdomen: No acute abnormality.  Fatty liver infiltration. Musculoskeletal: Mild curvature and degenerative changes along the spine. Review of the MIP images confirms the above findings. IMPRESSION: No pulmonary embolism identified. Small hiatal hernia. Aortic Atherosclerosis (ICD10-I70.0). Electronically Signed   By: Karen Kays M.D.   On: 03/14/2023 17:39   DG Chest Portable 1 View  Result Date: 03/14/2023 CLINICAL DATA:  Neck and bilateral shoulder pain with chest heaviness for 2 days. EXAM: PORTABLE CHEST 1 VIEW COMPARISON:  None Available. FINDINGS: The heart size and mediastinal contours are normal. The lungs are clear. There is no pleural effusion or pneumothorax. No acute osseous findings are identified. There are  degenerative changes in the thoracic spine. There are calcifications lateral to the left humeral head which could reflect hydroxyapatite deposition. IMPRESSION: No evidence of acute cardiopulmonary process. Possible hydroxyapatite deposition adjacent to the left humeral head. Electronically Signed   By: Carey Bullocks  M.D.   On: 03/14/2023 15:30    EKG EKG Interpretation  Date/Time:  Friday March 31 2023 21:00:25 EDT Ventricular Rate:  75 PR Interval:  164 QRS Duration: 74 QT Interval:  366 QTC Calculation: 408 R Axis:   36 Text Interpretation: Normal sinus rhythm non specific st t wave flattening Confirmed by Maliea Grandmaison ([id number]) on 04/01/2023 12:18:25 AM  Radiology DG Chest 2 View  Result Date: 03/31/2023 CLINICAL DATA:  Left-sided neck pain. EXAM: CHEST - 2 VIEW COMPARISON:  Chest radiograph and CTA 03/14/2023 FINDINGS: The cardiomediastinal contours are normal. Again seen azygous fissure. The lungs are clear. Pulmonary vasculature is normal. No consolidation, pleural effusion, or pneumothorax. No acute osseous abnormalities are seen. IMPRESSION: No acute chest findings. Electronically Signed   By: Narda Rutherford M.D.   On: 03/31/2023 21:18    Procedures Procedures    Medications Ordered in ED Medications - No data to display  ED Course/ Medical Decision Making/ A&P                             Medical Decision Making Patient with tingling in the shoulder and neck x 2 this week   Amount and/or Complexity of Data Reviewed Independent Historian: spouse    Details: See above  External Data Reviewed: labs, ECG and notes.    Details: Previous visit reviewed  Labs: ordered.    Details: All labs reviewed:  2 negative troponins 3 and 3.  Normal sodium 138, normal potassium 3.9, elevated glucose 161, normal creatinine.  White count slight elevation 11.1, normal hemoglobin 14.5, normal platelet count 360.  Normal coagulation studies  Radiology: ordered and independent interpretation  performed.    Details: Normal CXR by me  ECG/medicine tests: ordered and independent interpretation performed. Decision-making details documented in ED Course.  Risk Risk Details: Very well appearing.  Ruled out for MI in the ED with negative EKG and 2 negative troponin.  Glucose is elevated so not the source of the issue.  This is likely MSK in nature.  I have advised close follow up with PMD and cardiology for ongoing care.  Stable for discharge.      Final Clinical Impression(s) / ED Diagnoses Final diagnoses:  None   Return for intractable cough, coughing up blood, fevers > 100.4 unrelieved by medication, shortness of breath, intractable vomiting, chest pain, shortness of breath, weakness, numbness, changes in speech, facial asymmetry, abdominal pain, passing out, Inability to tolerate liquids or food, cough, altered mental status or any concerns. No signs of systemic illness or infection. The patient is nontoxic-appearing on exam and vital signs are within normal limits.  I have reviewed the triage vital signs and the nursing notes. Pertinent labs & imaging results that were available during my care of the patient were reviewed by me and considered in my medical decision making (see chart for details). After history, exam, and medical workup I feel the patient has been appropriately medically screened and is safe for discharge home. Pertinent diagnoses were discussed with the patient. Patient was given return precautions Rx / DC Orders ED Discharge Orders     None         Jontae Adebayo, MD 04/01/23 6045

## 2023-04-03 ENCOUNTER — Encounter (HOSPITAL_COMMUNITY)
Admission: RE | Admit: 2023-04-03 | Discharge: 2023-04-03 | Disposition: A | Discharge status: Home / Self Care | Payer: BC Managed Care – PPO | Source: Ambulatory Visit | Attending: Internal Medicine | Admitting: Internal Medicine

## 2023-04-03 DIAGNOSIS — Z955 Presence of coronary angioplasty implant and graft: Secondary | ICD-10-CM

## 2023-04-03 DIAGNOSIS — I214 Non-ST elevation (NSTEMI) myocardial infarction: Secondary | ICD-10-CM | POA: Diagnosis not present

## 2023-04-03 LAB — GLUCOSE, CAPILLARY
Glucose-Capillary: 150 mg/dL — ABNORMAL HIGH (ref 70–99)
Glucose-Capillary: 112 mg/dL — ABNORMAL HIGH (ref 70–99)

## 2023-04-03 NOTE — Progress Notes (Signed)
Daily Session Note  Patient Details  Name: Angela Knight MRN: [id number] Date of Birth: [birth year] Referring Provider:   Flowsheet Row INTENSIVE CARDIAC REHAB ORIENT from 03/30/2023 in [facility]  Referring Provider Ruthe Mannan, MD       Encounter Date: 04/03/2023  Check In:  Session Check In - 04/03/23 1107       Check-In   Supervising physician immediately available to respond to emergencies [facility] MD immediately available    Physician(s) Jari Favre, PA    Location [facility]    Staff Present Lorin Picket, MS, ACSM-CEP, CCRP, Exercise Physiologist;Sarah Cleophas Dunker, RN, MSN;Johnny Hale Bogus, MS, Exercise Physiologist;Olinty Peggye Pitt, MS, ACSM-CEP, Exercise Physiologist;Jill Ruppe, RN, BSN    Virtual Visit No    Medication changes reported     No    Fall or balance concerns reported    No    Tobacco Cessation No Change    Warm-up and Cool-down Performed as group-led instruction    Resistance Training Performed Yes    VAD Patient? No    PAD/SET Patient? No      Pain Assessment   Currently in Pain? No/denies    Pain Score 0-No pain    Multiple Pain Sites No             Capillary Blood Glucose: Results for orders placed or performed during the hospital encounter of 04/03/23 (from the past 24 hour(s))  Glucose, capillary     Status: Abnormal   Collection Time: 04/03/23  9:51 AM  Result Value Ref Range   Glucose-Capillary 112 (H) 70 - 99 mg/dL     Exercise Prescription Changes - 04/03/23 1100       Response to Exercise   Blood Pressure (Admit) 104/72    Blood Pressure (Exercise) 152/80    Blood Pressure (Exit) 98/64    Heart Rate (Admit) 60 bpm    Heart Rate (Exercise) 92 bpm    Heart Rate (Exit) 69 bpm    Rating of Perceived Exertion (Exercise) 11    Symptoms None    Comments Pt's first day in the CRP2 program    Duration Continue with 30 min of aerobic exercise without  signs/symptoms of physical distress.    Intensity THRR unchanged      Progression   Progression Continue to progress workloads to maintain intensity without signs/symptoms of physical distress.    Average METs 2.25      Resistance Training   Training Prescription Yes    Weight 3 lbs    Reps 10-15    Time 10 Minutes      Interval Training   Interval Training No      Recumbant Bike   Level 1    RPM 78    Watts 23    METs 2.2      NuStep   Level 2    SPM 90    Minutes 15    METs 2.3             Social History   Tobacco Use  Smoking Status Never  Smokeless Tobacco Never    Goals Met:  Exercise tolerated well No report of concerns or symptoms today Strength training completed today  Goals Unmet:  Not Applicable  Comments: Pt started cardiac rehab today.  Pt tolerated light exercise without difficulty. VSS, telemetry-Sinus Rhythm, asymptomatic.  Medication list reconciled. Pt denies barriers to medicaiton compliance.  PSYCHOSOCIAL ASSESSMENT:  PHQ-3. Pt  exhibits positive coping skills, hopeful outlook with supportive family. No psychosocial needs identified at this time, no psychosocial interventions necessary.    Pt enjoys playing the piano,ukulele,church and spending time with family, arts and crafts .   Pt oriented to exercise equipment and routine.    Understanding verbalized.Thayer Headings RN BSN    Dr. Armanda Magic is Medical Director for Cardiac Rehab at [facility].

## 2023-04-05 ENCOUNTER — Encounter (HOSPITAL_COMMUNITY)
Admission: RE | Admit: 2023-04-05 | Discharge: 2023-04-05 | Disposition: A | Discharge status: Home / Self Care | Payer: BC Managed Care – PPO | Source: Ambulatory Visit | Attending: Internal Medicine | Admitting: Internal Medicine

## 2023-04-05 DIAGNOSIS — I214 Non-ST elevation (NSTEMI) myocardial infarction: Secondary | ICD-10-CM

## 2023-04-05 DIAGNOSIS — Z955 Presence of coronary angioplasty implant and graft: Secondary | ICD-10-CM

## 2023-04-05 LAB — GLUCOSE, CAPILLARY: Glucose-Capillary: 99 mg/dL (ref 70–99)

## 2023-04-07 ENCOUNTER — Encounter (HOSPITAL_COMMUNITY)
Admission: RE | Admit: 2023-04-07 | Discharge: 2023-04-07 | Disposition: A | Discharge status: Home / Self Care | Payer: BC Managed Care – PPO | Source: Ambulatory Visit | Attending: Internal Medicine | Admitting: Internal Medicine

## 2023-04-07 DIAGNOSIS — I214 Non-ST elevation (NSTEMI) myocardial infarction: Secondary | ICD-10-CM | POA: Diagnosis not present

## 2023-04-07 DIAGNOSIS — Z955 Presence of coronary angioplasty implant and graft: Secondary | ICD-10-CM

## 2023-04-07 LAB — GLUCOSE, CAPILLARY
Glucose-Capillary: 164 mg/dL — ABNORMAL HIGH (ref 70–99)
Glucose-Capillary: 134 mg/dL — ABNORMAL HIGH (ref 70–99)

## 2023-04-10 ENCOUNTER — Encounter (HOSPITAL_COMMUNITY)
Admission: RE | Admit: 2023-04-10 | Discharge: 2023-04-10 | Disposition: A | Discharge status: Home / Self Care | Payer: BC Managed Care – PPO | Source: Ambulatory Visit | Attending: Internal Medicine | Admitting: Internal Medicine

## 2023-04-10 DIAGNOSIS — I214 Non-ST elevation (NSTEMI) myocardial infarction: Secondary | ICD-10-CM

## 2023-04-10 DIAGNOSIS — Z955 Presence of coronary angioplasty implant and graft: Secondary | ICD-10-CM

## 2023-04-12 ENCOUNTER — Encounter (HOSPITAL_COMMUNITY)
Admission: RE | Admit: 2023-04-12 | Discharge: 2023-04-12 | Disposition: A | Discharge status: Home / Self Care | Payer: BC Managed Care – PPO | Source: Ambulatory Visit | Attending: Internal Medicine | Admitting: Internal Medicine

## 2023-04-12 DIAGNOSIS — I214 Non-ST elevation (NSTEMI) myocardial infarction: Secondary | ICD-10-CM | POA: Diagnosis not present

## 2023-04-12 DIAGNOSIS — Z955 Presence of coronary angioplasty implant and graft: Secondary | ICD-10-CM

## 2023-04-14 ENCOUNTER — Encounter (HOSPITAL_COMMUNITY)
Admission: RE | Admit: 2023-04-14 | Discharge: 2023-04-14 | Disposition: A | Discharge status: Home / Self Care | Payer: BC Managed Care – PPO | Source: Ambulatory Visit | Attending: Internal Medicine | Admitting: Internal Medicine

## 2023-04-14 DIAGNOSIS — I214 Non-ST elevation (NSTEMI) myocardial infarction: Secondary | ICD-10-CM | POA: Diagnosis not present

## 2023-04-14 DIAGNOSIS — Z955 Presence of coronary angioplasty implant and graft: Secondary | ICD-10-CM

## 2023-04-17 ENCOUNTER — Other Ambulatory Visit: Payer: Self-pay

## 2023-04-17 ENCOUNTER — Encounter (HOSPITAL_COMMUNITY)
Admission: RE | Admit: 2023-04-17 | Discharge: 2023-04-17 | Disposition: A | Discharge status: Home / Self Care | Payer: BC Managed Care – PPO | Source: Ambulatory Visit | Attending: Internal Medicine | Admitting: Internal Medicine

## 2023-04-17 DIAGNOSIS — I214 Non-ST elevation (NSTEMI) myocardial infarction: Secondary | ICD-10-CM | POA: Diagnosis not present

## 2023-04-17 DIAGNOSIS — Z955 Presence of coronary angioplasty implant and graft: Secondary | ICD-10-CM

## 2023-04-17 MED ORDER — METOPROLOL TARTRATE 25 MG PO TABS: 12.5000 mg | ORAL_TABLET | Freq: Two times a day (BID) | ORAL | 3 refills | Status: DC

## 2023-04-17 MED ORDER — TICAGRELOR 90 MG PO TABS: 90.0000 mg | ORAL_TABLET | Freq: Two times a day (BID) | ORAL | 3 refills | Status: DC

## 2023-04-17 MED ORDER — ATORVASTATIN CALCIUM 80 MG PO TABS: 80.0000 mg | ORAL_TABLET | Freq: Every day | ORAL | 3 refills | Status: DC

## 2023-04-17 MED ORDER — LOSARTAN POTASSIUM 25 MG PO TABS: 12.5000 mg | ORAL_TABLET | Freq: Every day | ORAL | 3 refills | Status: DC

## 2023-04-17 NOTE — Telephone Encounter (Signed)
Pt's medications were sent to pt's pharmacy as requested. Confirmation received.  

## 2023-04-19 ENCOUNTER — Encounter (HOSPITAL_COMMUNITY)
Admission: RE | Admit: 2023-04-19 | Discharge: 2023-04-19 | Disposition: A | Discharge status: Home / Self Care | Payer: BC Managed Care – PPO | Source: Ambulatory Visit | Attending: Internal Medicine | Admitting: Internal Medicine

## 2023-04-19 DIAGNOSIS — Z955 Presence of coronary angioplasty implant and graft: Secondary | ICD-10-CM

## 2023-04-19 DIAGNOSIS — I214 Non-ST elevation (NSTEMI) myocardial infarction: Secondary | ICD-10-CM | POA: Diagnosis not present

## 2023-04-21 ENCOUNTER — Encounter (HOSPITAL_COMMUNITY)
Admission: RE | Admit: 2023-04-21 | Discharge: 2023-04-21 | Disposition: A | Discharge status: Home / Self Care | Payer: BC Managed Care – PPO | Source: Ambulatory Visit | Attending: Internal Medicine | Admitting: Internal Medicine

## 2023-04-21 DIAGNOSIS — I214 Non-ST elevation (NSTEMI) myocardial infarction: Secondary | ICD-10-CM

## 2023-04-21 DIAGNOSIS — Z955 Presence of coronary angioplasty implant and graft: Secondary | ICD-10-CM

## 2023-04-24 ENCOUNTER — Encounter (HOSPITAL_COMMUNITY)
Admission: RE | Admit: 2023-04-24 | Discharge: 2023-04-24 | Disposition: A | Discharge status: Home / Self Care | Payer: BC Managed Care – PPO | Source: Ambulatory Visit | Attending: Internal Medicine | Admitting: Internal Medicine

## 2023-04-24 DIAGNOSIS — I252 Old myocardial infarction: Secondary | ICD-10-CM | POA: Insufficient documentation

## 2023-04-24 DIAGNOSIS — Z955 Presence of coronary angioplasty implant and graft: Secondary | ICD-10-CM | POA: Diagnosis present

## 2023-04-24 DIAGNOSIS — Z48812 Encounter for surgical aftercare following surgery on the circulatory system: Secondary | ICD-10-CM | POA: Insufficient documentation

## 2023-04-24 DIAGNOSIS — I214 Non-ST elevation (NSTEMI) myocardial infarction: Secondary | ICD-10-CM

## 2023-04-25 NOTE — Progress Notes (Signed)
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 Swallow whole., Disp: 90 tablet, Rfl: 3   atorvastatin (LIPITOR) 80 MG tablet, Take 1 tablet (80 mg total) by mouth at bedtime., Disp: 90 tablet, Rfl: 3   estradiol (VIVELLE-DOT) 0.0375 MG/24HR, Place 1 patch onto the skin 2 (two) times a week. (Patient not taking: Reported on 03/30/2023), Disp: , Rfl:    famotidine-calcium carbonate-magnesium hydroxide (PEPCID COMPLETE) 10-800-165 MG chewable tablet, Chew 1 tablet by mouth every evening., Disp: , Rfl:    glipiZIDE (GLUCOTROL XL) 5 MG 24 hr tablet, Take 5 mg by mouth daily., Disp: , Rfl:    losartan (COZAAR) 25 MG tablet, Take 0.5 tablets (12.5 mg total) by mouth daily., Disp: 45 tablet, Rfl: 3   metFORMIN (GLUCOPHAGE-XR) 500 MG 24 hr tablet, Take 500 mg by mouth 2 (two) times daily., Disp: , Rfl:    metoprolol tartrate (LOPRESSOR) 25 MG tablet, Take 0.5 tablets (12.5 mg total) by mouth 2 (two) times daily., Disp: 90 tablet, Rfl: 3   nitroGLYCERIN (NITROSTAT) 0.4 MG SL tablet, Place 1 tablet (0.4 mg total) under the tongue every 5 (five) minutes x 3 doses as needed for chest pain., Disp: 30 tablet, Rfl: 0   OMEPRAZOLE PO, Take 1 tablet by mouth daily. Pt unsure of strength, Disp: , Rfl:    OZEMPIC, 0.25 OR 0.5  MG/DOSE, 2 MG/3ML SOPN, Inject 0.25 mg into the skin once a week. (Patient not taking: Reported on 03/22/2023), Disp: , Rfl:    ticagrelor (BRILINTA) 90 MG TABS tablet, Take 1 tablet (90 mg total) by mouth 2 (two) times daily., Disp: 180 tablet, Rfl: 3   Vibegron 75 MG TABS, Take 1 tablet by mouth daily. (Patient not taking: Reported on 03/22/2023), Disp: , Rfl:   Past Medical History: Past Medical History:  Diagnosis Date   Car sickness    Complication of anesthesia    severe   Family history of adverse reaction to anesthesia    PONV   GERD (gastroesophageal reflux disease)    occ. OTC med used   History of esophageal stricture    stenosis dilatation 05-02-2016   History of gastric polyp    benign 05-02-2016   PONV (postoperative nausea and vomiting)    SVD (spontaneous vaginal delivery)    x 6   Tubulovillous adenoma of rectum    multiple recurrent's   Wears glasses     Tobacco Use: Social History   Tobacco Use  Smoking Status Never  Smokeless Tobacco Never    Labs: Review Flowsheet       Latest Ref Rng & Units 03/15/2023  Labs for ITP Cardiac and Pulmonary Rehab  Cholestrol 0 - 200 mg/dL 409   LDL (calc) 0 - 99 mg/dL 87   HDL-C >81 mg/dL 43   Trlycerides <  150 mg/dL 409   Hemoglobin W1X 4.8 - 5.6 % 6.8     Capillary Blood Glucose: Lab Results  Component Value Date   GLUCAP 134 (H) 04/07/2023   GLUCAP 164 (H) 04/07/2023   GLUCAP 99 04/05/2023   GLUCAP 112 (H) 04/03/2023   GLUCAP 150 (H) 04/03/2023     Exercise Target Goals: Exercise Program Goal: Individual exercise prescription set using results from initial 6 min walk test and THRR while considering  patient's activity barriers and safety.   Exercise Prescription Goal: Initial exercise prescription builds to 30-45 minutes a day of aerobic activity, 2-3 days per week.  Home exercise guidelines will be given to patient during program as part of exercise prescription that the participant will  acknowledge.  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 HR 82 bpm     Max Ex. BP 110/72     2 Minute Post BP 104/62              Oxygen Initial Assessment:   Oxygen Re-Evaluation:   Oxygen Discharge (Final Oxygen Re-Evaluation):   Initial Exercise Prescription:  Initial Exercise Prescription - 03/30/23 0800       Date of Initial Exercise RX and Referring Provider   Date 03/30/23    Referring Provider Ruthe Mannan, MD    Expected Discharge Date 06/09/23      Recumbant Bike   Level 1    RPM 60    Watts 25    Minutes 15    METs 3.6      NuStep   Level 2    SPM 75    Minutes 15    METs 3.6      Prescription Details   Frequency (times per week) 3    Duration Progress to 30 minutes of continuous aerobic without signs/symptoms of physical distress      Intensity   THRR 40-80% of Max Heartrate 67-134    Ratings of Perceived Exertion 11-13    Perceived Dyspnea 0-4      Progression   Progression Continue progressive overload as per policy without signs/symptoms or physical distress.      Resistance Training   Training Prescription Yes     Weight 3  lbs    Reps 10-15             Perform Capillary Blood Glucose checks as needed.  Exercise Prescription Changes:   Exercise Prescription Changes     Row Name 04/03/23 1100 04/17/23 1100 04/21/23 1200         Response to Exercise   Blood Pressure (Admit) 104/72 112/66 100/60     Blood Pressure (Exercise) 152/80 138/70 130/74     Blood Pressure (Exit) 98/64 98/60 96/60      Heart Rate (Admit) 60 bpm 61 bpm 58 bpm     Heart Rate (Exercise) 92 bpm 86 bpm 92 bpm     Heart Rate (Exit) 69 bpm 67 bpm 57 bpm     Rating of Perceived Exertion (Exercise) 11 11 11      Symptoms None None None     Comments Pt's first day in the CRP2 program Reviewed METs Reviewed Home exercise Rx     Duration Continue with 30 min of aerobic exercise without signs/symptoms of physical distress. Continue with 30 min of aerobic exercise without signs/symptoms of physical distress. Continue with 30 min of aerobic exercise without signs/symptoms of physical distress.     Intensity THRR unchanged THRR unchanged THRR unchanged       Progression   Progression Continue to progress workloads to maintain intensity without signs/symptoms of physical distress. Continue to progress workloads to maintain intensity without signs/symptoms of physical distress. Continue to progress workloads to maintain intensity without signs/symptoms of physical distress.     Average METs 2.25 2.5 2.95       Resistance Training   Training Prescription Yes Yes Yes     Weight 3 lbs 3 lbs 3 lbs     Reps 10-15 10-15 10-15     Time 10 Minutes 10 Minutes 10 Minutes       Interval Training   Interval Training No No No       Recumbant Bike   Level 1 2 2      RPM 78 63 79     Watts 23 23 34     Minutes -- 15 15     METs 2.2 2.4 2.6       NuStep   Level 2 3 3      SPM 90 103 117     Minutes 15 15 15      METs 2.3 2.6 3.3       Home Exercise Plan   Plans to continue exercise at -- -- Home (comment)     Frequency -- -- Add 2  additional days to program exercise sessions.     Initial Home Exercises Provided -- -- 04/21/23              Exercise Comments:   Exercise Comments     Row Name 04/03/23 1120 04/17/23 1103 04/21/23 1209       Exercise Comments Pt's first day in the CRP2 program. Pt exercised with no complaints. Reviewed METs. Pt is making good progress. Pt does not wish to make any changes at this time. Reviewed home exercise Rx. Pt is currently walking at home 2x/week with her spouse for 30 minutes. Pt is at goal for 150 minutes/week of exercise. Pt verbalized understanding of the home exericse Rx and was provided a copy.              Exercise Goals and Review:   Exercise Goals     Row Name 03/30/23 [phone]  Exercise Goals   Increase Physical Activity Yes       Intervention Provide advice, education, support and counseling about physical activity/exercise needs.;Develop an individualized exercise prescription for aerobic and resistive training based on initial evaluation findings, risk stratification, comorbidities and participant's personal goals.       Expected Outcomes Short Term: Attend rehab on a regular basis to increase amount of physical activity.;Long Term: Add in home exercise to make exercise part of routine and to increase amount of physical activity.;Long Term: Exercising regularly at least 3-5 days a week.       Increase Strength and Stamina Yes       Intervention Provide advice, education, support and counseling about physical activity/exercise needs.;Develop an individualized exercise prescription for aerobic and resistive training based on initial evaluation findings, risk stratification, comorbidities and participant's personal goals.       Expected Outcomes Short Term: Increase workloads from initial exercise prescription for resistance, speed, and METs.;Short Term: Perform resistance training exercises routinely during rehab and add in resistance training at  home;Long Term: Improve cardiorespiratory fitness, muscular endurance and strength as measured by increased METs and functional capacity ( )       Able to understand and use rate of perceived exertion (RPE) scale Yes       Intervention Provide education and explanation on how to use RPE scale       Expected Outcomes Short Term: Able to use RPE daily in rehab to express subjective intensity level;Long Term:  Able to use RPE to guide intensity level when exercising independently       Knowledge and understanding of Target Heart Rate Range (THRR) Yes       Intervention Provide education and explanation of THRR including how the numbers were predicted and where they are located for reference       Expected Outcomes Short Term: Able to state/look up THRR;Short Term: Able to use daily as guideline for intensity in rehab;Long Term: Able to use THRR to govern intensity when exercising independently       Understanding of Exercise Prescription Yes       Intervention Provide education, explanation, and written materials on patient's individual exercise prescription       Expected Outcomes Short Term: Able to explain program exercise prescription;Long Term: Able to explain home exercise prescription to exercise independently                Exercise Goals Re-Evaluation :  Exercise Goals Re-Evaluation     Row Name 04/03/23 1119             Exercise Goal Re-Evaluation   Exercise Goals Review Increase Physical Activity;Increase Strength and Stamina;Able to understand and use rate of perceived exertion (RPE) scale;Knowledge and understanding of Target Heart Rate Range (THRR);Understanding of Exercise Prescription       Comments Pt's first day in the CRP2 program. Pt understands the exercise Rx, THRR, and RPE scale.       Expected Outcomes Will continue to monitor patient and progress exericse workloads as tolerated.                Discharge Exercise Prescription (Final Exercise Prescription  Changes):  Exercise Prescription Changes - 04/21/23 1200       Response to Exercise   Blood Pressure (Admit) 100/60    Blood Pressure (Exercise) 130/74    Blood Pressure (Exit) 96/60    Heart Rate (Admit) 58 bpm    Heart Rate (Exercise) 92 bpm  Heart Rate (Exit) 57 bpm    Rating of Perceived Exertion (Exercise) 11    Symptoms None    Comments Reviewed Home exercise Rx    Duration Continue with 30 min of aerobic exercise without signs/symptoms of physical distress.    Intensity THRR unchanged      Progression   Progression Continue to progress workloads to maintain intensity without signs/symptoms of physical distress.    Average METs 2.95      Resistance Training   Training Prescription Yes    Weight 3 lbs    Reps 10-15    Time 10 Minutes      Interval Training   Interval Training No      Recumbant Bike   Level 2    RPM 79    Watts 34    Minutes 15    METs 2.6      NuStep   Level 3    SPM 117    Minutes 15    METs 3.3      Home Exercise Plan   Plans to continue exercise at Home (comment)    Frequency Add 2 additional days to program exercise sessions.    Initial Home Exercises Provided 04/21/23             Nutrition:  Target Goals: Understanding of nutrition guidelines, daily intake of sodium 1500mg , cholesterol 200mg , calories 30% from fat and 7% or less from saturated fats, daily to have 5 or more servings of fruits and vegetables.  Biometrics:  Pre Biometrics - 03/30/23 0900       Pre Biometrics   Waist Circumference 39.5 inches    Hip Circumference 43 inches    Waist to Hip Ratio 0.92 %    Triceps Skinfold 35 mm    % Body Fat 42.1 %    Grip Strength 35 kg    Flexibility 17.5 in    Single Leg Stand 30 seconds              Nutrition Therapy Plan and Nutrition Goals:  Nutrition Therapy & Goals - 04/03/23 0952       Nutrition Therapy   Diet Heart Healthy/Carbohydrate Consistent diet    Drug/Food Interactions Statins/Certain  Fruits      Personal Nutrition Goals   Nutrition Goal Patient to identify strategies for reducing cardiovascular risk by attending the [employer] education and nutrition series weekly.    Personal Goal #2 Patient to improve diet quality by using the plate method as a guide for meal planning to include lean protein/plant protein, fruits, vegetables, whole grains, nonfat dairy as part of a well-balanced diet.    Comments Macarena reports motivation to make dietary changes to aid with blood sugar control and heart health. She reports history of gestational diabetes and DM2; she did try Ozempic but was unable to tolerate side effects. Her husband is supportive of making lifestyle changes. Patient will benefit from participation in intensive cardiac rehab for nutrition, exercise, and lifestyle modification.      Intervention Plan   Intervention Prescribe, educate and counsel regarding individualized specific dietary modifications aiming towards targeted core components such as weight, hypertension, lipid management, diabetes, heart failure and other comorbidities.;Nutrition handout(s) given to patient.    Expected Outcomes Short Term Goal: Understand basic principles of dietary content, such as calories, fat, sodium, cholesterol and nutrients.;Long Term Goal: Adherence to prescribed nutrition plan.             Nutrition Assessments:  Nutrition Assessments -  04/03/23 1414       Rate Your Plate Scores   Pre Score 58            MEDIFICTS Score Key: ?70 Need to make dietary changes  40-70 Heart Healthy Diet ? 40 Therapeutic Level Cholesterol Diet   Flowsheet Row INTENSIVE CARDIAC REHAB from 04/03/2023 in [facility] for Heart, Vascular, & Lung Health  Picture Your Plate Total Score on Admission 58      Picture Your Plate Scores: <78 Unhealthy dietary pattern with much room for improvement. 41-50 Dietary pattern unlikely to meet recommendations for good health and room  for improvement. 51-60 More healthful dietary pattern, with some room for improvement.  >60 Healthy dietary pattern, although there may be some specific behaviors that could be improved.    Nutrition Goals Re-Evaluation:  Nutrition Goals Re-Evaluation     Row Name 04/03/23 0952             Goals   Current Weight 201 lb 1 oz (91.2 kg)       Comment triglycerides 200, A1c 6.8, LipoproteinA WNL       Expected Outcome Kamori reports motivation to make dietary changes to aid with blood sugar control and heart health. She reports history of gestational diabetes and DM2; she did try Ozempic but was unable to tolerate side effects. Her husband is supportive of making lifestyle changes. Patient will benefit from participation in intensive cardiac rehab for nutrition, exercise, and lifestyle modification.                Nutrition Goals Re-Evaluation:  Nutrition Goals Re-Evaluation     Row Name 04/03/23 0952             Goals   Current Weight 201 lb 1 oz (91.2 kg)       Comment triglycerides 200, A1c 6.8, LipoproteinA WNL       Expected Outcome Emalie reports motivation to make dietary changes to aid with blood sugar control and heart health. She reports history of gestational diabetes and DM2; she did try Ozempic but was unable to tolerate side effects. Her husband is supportive of making lifestyle changes. Patient will benefit from participation in intensive cardiac rehab for nutrition, exercise, and lifestyle modification.                Nutrition Goals Discharge (Final Nutrition Goals Re-Evaluation):  Nutrition Goals Re-Evaluation - 04/03/23 0952       Goals   Current Weight 201 lb 1 oz (91.2 kg)    Comment triglycerides 200, A1c 6.8, LipoproteinA WNL    Expected Outcome Mckinnley reports motivation to make dietary changes to aid with blood sugar control and heart health. She reports history of gestational diabetes and DM2; she did try Ozempic but was unable to tolerate side  effects. Her husband is supportive of making lifestyle changes. Patient will benefit from participation in intensive cardiac rehab for nutrition, exercise, and lifestyle modification.             Psychosocial: Target Goals: Acknowledge presence or absence of significant depression and/or stress, maximize coping skills, provide positive support system. Participant is able to verbalize types and ability to use techniques and skills needed for reducing stress and depression.  Initial Review & Psychosocial Screening:  Initial Psych Review & Screening - 03/30/23 0840       Initial Review   Current issues with None Identified      Family Dynamics   Good Support  System? Yes    Comments Pt has spouse, 6 sons, 5 siblings and her church family for support      Barriers   Psychosocial barriers to participate in program The patient should benefit from training in stress management and relaxation.      Screening Interventions   Interventions Encouraged to exercise             Quality of Life Scores:  Quality of Life - 03/30/23 1141       Quality of Life   Select Quality of Life      Quality of Life Scores   Health/Function Pre 21.43 %    Socioeconomic Pre 28.13 %    Psych/Spiritual Pre 23.21 %    Family Pre 27.6 %    GLOBAL Pre 24.2 %            Scores of 19 and below usually indicate a poorer quality of life in these areas.  A difference of  2-3 points is a clinically meaningful difference.  A difference of 2-3 points in the total score of the Quality of Life Index has been associated with significant improvement in overall quality of life, self-image, physical symptoms, and general health in studies assessing change in quality of life.  PHQ-9: Review Flowsheet       03/30/2023  Depression screen PHQ 2/9  Decreased Interest 0  Down, Depressed, Hopeless 1  PHQ - 2 Score 1  Altered sleeping 1  Tired, decreased energy 1  Change in appetite 0  Feeling bad or failure  about yourself  0  Trouble concentrating 0  Moving slowly or fidgety/restless 0  Suicidal thoughts 0  PHQ-9 Score 3  Difficult doing work/chores Somewhat difficult   Interpretation of Total Score  Total Score Depression Severity:  1-4 = Minimal depression, 5-9 = Mild depression, 10-14 = Moderate depression, 15-19 = Moderately severe depression, 20-27 = Severe depression   Psychosocial Evaluation and Intervention:   Psychosocial Re-Evaluation:  Psychosocial Re-Evaluation     Row Name 04/04/23 1053 04/25/23 1723           Psychosocial Re-Evaluation   Current issues with None Identified Current Stress Concerns;History of Depression      Comments Will review PHQ-9 in the upcoming week Shawny says that 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 Sarafina still reports having difficulty sleeping as she has problems with frequent urination      Expected Outcomes -- Airianna will have decreased or controlled depression/ stressors upon completion of intensive cardiac rehab      Interventions Encouraged to attend Cardiac Rehabilitation for the exercise Encouraged to attend Cardiac Rehabilitation for the exercise      Continue Psychosocial Services  Follow up required by staff Follow up required by staff        Initial Review   Source of Stress Concerns -- Chronic Illness      Comments -- Will continnue to monitor and offeer support as needed               Psychosocial Discharge (Final Psychosocial Re-Evaluation):  Psychosocial Re-Evaluation - 04/25/23 1723       Psychosocial Re-Evaluation   Current issues with Current Stress Concerns;History of Depression    Comments Jazmene says that 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 Ocean still reports having difficulty sleeping as she has problems with frequent urination    Expected Outcomes Kalinda will have decreased or controlled depression/ stressors upon completion  of intensive cardiac rehab     Interventions Encouraged to attend Cardiac Rehabilitation for the exercise    Continue Psychosocial Services  Follow up required by staff      Initial Review   Source of Stress Concerns Chronic Illness    Comments Will continnue to monitor and offeer support as needed             Vocational Rehabilitation: Provide vocational rehab assistance to qualifying candidates.   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 Participant will state understanding/return demonstration of topics presented.    Education     Row Name 04/03/23 1100     Education   Cardiac Education Topics [employer]   [employer] Workshops     Workshops   Educator Exercise Physiologist   Select Psychosocial   Psychosocial Workshop Recognizing and Reducing Stress   Instruction Review Code 1- Verbalizes Understanding   Class Start Time [phone]   Class Stop Time 0847   Class Time Calculation (min) 35 min    Row Name 04/05/23 1000     Education   Cardiac Education Topics [employer]in   Secondary school teacher School   Educator Dietitian   Weekly Topic Personalizing Your Pritikin Plate   Instruction Review Code 1- Verbalizes Understanding   Class Start Time 0815   Class Stop Time [phone]   Class Time Calculation (min) 37 min    Row Name 04/07/23 0900     Education   Cardiac Education Topics Pritikin   Select Core Videos     Core Videos   Educator Exercise Physiologist   Select Psychosocial   Psychosocial Healthy Minds, Bodies, Hearts   Instruction Review Code 1- Verbalizes Understanding   Class Start Time 0815   Class Stop Time 0850   Class Time Calculation (min)  35 min    Row Name 04/10/23 0900     Education   Cardiac Education Topics Pritikin   Glass blower/designer Nutrition   Instruction Review Code 1- Verbalizes Understanding   Class Start Time 0815   Class Stop Time 0858   Class Time Calculation (min) 43 min    Row Name 04/12/23 1400     Education   Cardiac Education Topics Pritikin   Secondary school teacher School   Educator Dietitian   Weekly Topic Delicious Desserts   Instruction Review Code 1- Verbalizes Understanding   Class Start Time [phone]   Class Stop Time 0845   Class Time Calculation (min) 35 min    Row Name 04/14/23 0900     Education   Cardiac Education Topics Pritikin   Select Core Videos     Core Videos   Educator Dietitian   Select Nutrition   Instruction Review Code 1- Verbalizes Understanding   Class Start Time 0810   Class Stop Time 0852   Class Time Calculation (min) 42 min    Row Name 04/17/23 0900     Education   Cardiac Education Topics [employer]in   [employer]  Workshops   Biomedical scientist Exercise   Exercise Workshop Exercise Basics: Diplomatic Services operational officer   Instruction Review Code 1- Verbalizes Understanding   Class Start Time [phone]   Class Stop Time 0854   Class Time Calculation (min) 46 min    Row Name 04/19/23 0900     Education   Cardiac Education Topics Pritikin   Secondary school teacher School   Educator Dietitian   Weekly Topic Tasty Appetizers and Snacks   Instruction Review Code 1- Verbalizes Understanding   Class Start Time [phone]   Class Stop Time 0845   Class Time Calculation (min) 31 min    Row Name 04/21/23 0800     Education   Cardiac Education Topics Pritikin   Select Core Videos     Core Videos   Educator Exercise Physiologist   Select Nutrition   Nutrition Nutrition Action Plan   Instruction Review Code 1- Verbalizes Understanding   Class Start Time 0813    Class Stop Time 0843   Class Time Calculation (min) 30 min    Row Name 04/24/23 0900     Education   Cardiac Education Topics Pritikin   Select Core Videos     Core Videos   Educator Dietitian   Select Nutrition   Nutrition Calorie Density   Instruction Review Code 1- Verbalizes Understanding   Class Start Time 0815   Class Stop Time 0857   Class Time Calculation (min) 42 min            Core Videos: Exercise    Move It!  Clinical staff conducted group or individual video education with verbal and written material and guidebook.  Patient learns the recommended Pritikin exercise program. Exercise with the goal of living a long, healthy life. Some of the health benefits of exercise include controlled diabetes, healthier blood pressure levels, improved cholesterol levels, improved heart and lung capacity, improved sleep, and better body composition. Everyone should speak with their doctor before starting or changing an exercise routine.  Biomechanical Limitations Clinical staff conducted group or individual video education with verbal and written material and guidebook.  Patient learns how biomechanical limitations can impact exercise and how we can mitigate and possibly overcome limitations to have an impactful and balanced exercise routine.  Body Composition Clinical staff conducted group or individual video education with verbal and written material and guidebook.  Patient learns that body composition (ratio of muscle mass to fat mass) is a key component to assessing overall fitness, rather than body weight alone. Increased fat mass, especially visceral belly fat, can put [place] at increased risk for metabolic syndrome, type 2 diabetes, heart disease, and even death. It is recommended to combine diet and exercise (cardiovascular and resistance training) to improve your body composition. Seek guidance from your physician and exercise physiologist before implementing an exercise  routine.  Exercise Action Plan Clinical staff conducted group or individual video education with verbal and written material and guidebook.  Patient learns the recommended strategies to achieve and enjoy long-term exercise adherence, including variety, self-motivation, self-efficacy, and positive decision making. Benefits of exercise include fitness, good health, weight management, more energy, better sleep, less stress, and overall well-being.  Medical   Heart Disease Risk Reduction Clinical staff conducted group or individual video education with verbal and written material and guidebook.  Patient learns our heart is our most vital organ as it circulates oxygen, nutrients, white blood cells, and hormones throughout the  entire body, and carries waste away. Data supports a plant-based eating plan like the Pritikin Program for its effectiveness in slowing progression of and reversing heart disease. The video provides a number of recommendations to address heart disease.   Metabolic Syndrome and Belly Fat  Clinical staff conducted group or individual video education with verbal and written material and guidebook.  Patient learns what metabolic syndrome is, how it leads to heart disease, and how one can reverse it and keep it from coming back. You have metabolic syndrome if you have 3 of the following 5 criteria: abdominal obesity, high blood pressure, high triglycerides, low HDL cholesterol, and high blood sugar.  Hypertension and Heart Disease Clinical staff conducted group or individual video education with verbal and written material and guidebook.  Patient learns that high blood pressure, or hypertension, is very common in the [place]. Hypertension is largely due to excessive salt intake, but other important risk factors include being overweight, physical inactivity, drinking too much alcohol, smoking, and not eating enough potassium from fruits and vegetables. High blood pressure is a  leading risk factor for heart attack, stroke, congestive heart failure, dementia, kidney failure, and premature death. Long-term effects of excessive salt intake include stiffening of the arteries and thickening of heart muscle and organ damage. Recommendations include ways to reduce hypertension and the risk of heart disease.  Diseases of Our Time - Focusing on Diabetes Clinical staff conducted group or individual video education with verbal and written material and guidebook.  Patient learns why the best way to stop diseases of our time is prevention, through food and other lifestyle changes. Medicine (such as prescription pills and surgeries) is often only a Band-Aid on the problem, not a long-term solution. Most common diseases of our time include obesity, type 2 diabetes, hypertension, heart disease, and cancer. The Pritikin Program is recommended and has been proven to help reduce, reverse, and/or prevent the damaging effects of metabolic syndrome.  Nutrition   Overview of the Pritikin Eating Plan  Clinical staff conducted group or individual video education with verbal and written material and guidebook.  Patient learns about the [place] for disease risk reduction. The [place] emphasizes a wide variety of unrefined, minimally-processed carbohydrates, like fruits, vegetables, whole grains, and legumes. Go, Caution, and Stop food choices are explained. Plant-based and lean animal proteins are emphasized. Rationale provided for low sodium intake for blood pressure control, low added sugars for blood sugar stabilization, and low added fats and oils for coronary artery disease risk reduction and weight management.  Calorie Density  Clinical staff conducted group or individual video education with verbal and written material and guidebook.  Patient learns about calorie density and how it impacts the Pritikin Eating Plan. Knowing the characteristics of the food you choose will  help you decide whether those foods will lead to weight gain or weight loss, and whether you want to consume more or less of them. Weight loss is usually a side effect of the Pritikin Eating Plan because of its focus on low calorie-dense foods.  Label Reading  Clinical staff conducted group or individual video education with verbal and written material and guidebook.  Patient learns about the Pritikin recommended label reading guidelines and corresponding recommendations regarding calorie density, added sugars, sodium content, and whole grains.  Dining Out - Part 1  Clinical staff conducted group or individual video education with verbal and written material and guidebook.  Patient learns that restaurant meals can be sabotaging  because they can be so high in calories, fat, sodium, and/or sugar. Patient learns recommended strategies on how to positively address this and avoid unhealthy pitfalls.  Facts on Fats  Clinical staff conducted group or individual video education with verbal and written material and guidebook.  Patient learns that lifestyle modifications can be just as effective, if not more so, as many medications for lowering your risk of heart disease. A Pritikin lifestyle can help to reduce your risk of inflammation and atherosclerosis (cholesterol build-up, or plaque, in the artery walls). Lifestyle interventions such as dietary choices and physical activity address the cause of atherosclerosis. A review of the types of fats and their impact on blood cholesterol levels, along with dietary recommendations to reduce fat intake is also included.  Nutrition Action Plan  Clinical staff conducted group or individual video education with verbal and written material and guidebook.  Patient learns how to incorporate Pritikin recommendations into their lifestyle. Recommendations include planning and keeping personal health goals in mind as an important part of their success.  Healthy Mind-Set     Healthy Minds, Bodies, Hearts  Clinical staff conducted group or individual video education with verbal and written material and guidebook.  Patient learns how to identify when they are stressed. Video will discuss the impact of that stress, as well as the many benefits of stress management. Patient will also be introduced to stress management techniques. The way we think, act, and feel has an impact on our hearts.  How Our Thoughts Can Heal Our Hearts  Clinical staff conducted group or individual video education with verbal and written material and guidebook.  Patient learns that negative thoughts can cause depression and anxiety. This can result in negative lifestyle behavior and serious health problems. Cognitive behavioral therapy is an effective method to help control our thoughts in order to change and improve our emotional outlook.  Additional Videos:  Exercise    Improving Performance  Clinical staff conducted group or individual video education with verbal and written material and guidebook.  Patient learns to use a non-linear approach by alternating intensity levels and lengths of time spent exercising to help burn more calories and lose more body fat. Cardiovascular exercise helps improve heart health, metabolism, hormonal balance, blood sugar control, and recovery from fatigue. Resistance training improves strength, endurance, balance, coordination, reaction time, metabolism, and muscle mass. Flexibility exercise improves circulation, posture, and balance. Seek guidance from your physician and exercise physiologist before implementing an exercise routine and learn your capabilities and proper form for all exercise.  Introduction to Yoga  Clinical staff conducted group or individual video education with verbal and written material and guidebook.  Patient learns about yoga, a discipline of the coming together of mind, breath, and body. The benefits of yoga include improved flexibility,  improved range of motion, better posture and core strength, increased lung function, weight loss, and positive self-image. Yoga's heart health benefits include lowered blood pressure, healthier heart rate, decreased cholesterol and triglyceride levels, improved immune function, and reduced stress. Seek guidance from your physician and exercise physiologist before implementing an exercise routine and learn your capabilities and proper form for all exercise.  Medical   Aging: Enhancing Your Quality of Life  Clinical staff conducted group or individual video education with verbal and written material and guidebook.  Patient learns key strategies and recommendations to stay in good physical health and enhance quality of life, such as prevention strategies, having an advocate, securing a Health Care Proxy and Power of  Attorney, and keeping a list of medications and system for tracking them. It also discusses how to avoid risk for bone loss.  Biology of Weight Control  Clinical staff conducted group or individual video education with verbal and written material and guidebook.  Patient learns that weight gain occurs because we consume more calories than we burn (eating more, moving less). Even if your body weight is normal, you may have higher ratios of fat compared to muscle mass. Too much body fat puts you at increased risk for cardiovascular disease, heart attack, stroke, type 2 diabetes, and obesity-related cancers. In addition to exercise, following the [place] can help reduce your risk.  Decoding Lab Results  Clinical staff conducted group or individual video education with verbal and written material and guidebook.  Patient learns that lab test reflects one measurement whose values change over time and are influenced by many factors, including medication, stress, sleep, exercise, food, hydration, pre-existing medical conditions, and more. It is recommended to use the knowledge from this  video to become more involved with your lab results and evaluate your numbers to speak with your doctor.   Diseases of Our Time - Overview  Clinical staff conducted group or individual video education with verbal and written material and guidebook.  Patient learns that according to the CDC, 50% to 70% of chronic diseases (such as obesity, type 2 diabetes, elevated lipids, hypertension, and heart disease) are avoidable through lifestyle improvements including healthier food choices, listening to satiety cues, and increased physical activity.  Sleep Disorders Clinical staff conducted group or individual video education with verbal and written material and guidebook.  Patient learns how good quality and duration of sleep are important to overall health and well-being. Patient also learns about sleep disorders and how they impact health along with recommendations to address them, including discussing with a physician.  Nutrition  Dining Out - Part 2 Clinical staff conducted group or individual video education with verbal and written material and guidebook.  Patient learns how to plan ahead and communicate in order to maximize their dining experience in a healthy and nutritious manner. Included are recommended food choices based on the type of restaurant the patient is visiting.   Fueling a Best boy conducted group or individual video education with verbal and written material and guidebook.  There is a strong connection between our food choices and our health. Diseases like obesity and type 2 diabetes are very prevalent and are in large-part due to lifestyle choices. The Pritikin Eating Plan provides plenty of food and hunger-curbing satisfaction. It is easy to follow, affordable, and helps reduce health risks.  Menu Workshop  Clinical staff conducted group or individual video education with verbal and written material and guidebook.  Patient learns that restaurant meals can  sabotage health goals because they are often packed with calories, fat, sodium, and sugar. Recommendations include strategies to plan ahead and to communicate with the manager, chef, or server to help order a healthier meal.  Planning Your Eating Strategy  Clinical staff conducted group or individual video education with verbal and written material and guidebook.  Patient learns about the [place] and its benefit of reducing the risk of disease. The [place] does not focus on calories. Instead, it emphasizes high-quality, nutrient-rich foods. By knowing the characteristics of the foods, we choose, we can determine their calorie density and make informed decisions.  Targeting Your Nutrition Priorities  Clinical staff conducted group or individual  video education with verbal and written material and guidebook.  Patient learns that lifestyle habits have a tremendous impact on disease risk and progression. This video provides eating and physical activity recommendations based on your personal health goals, such as reducing LDL cholesterol, losing weight, preventing or controlling type 2 diabetes, and reducing high blood pressure.  Vitamins and Minerals  Clinical staff conducted group or individual video education with verbal and written material and guidebook.  Patient learns different ways to obtain key vitamins and minerals, including through a recommended healthy diet. It is important to discuss all supplements you take with your doctor.   Healthy Mind-Set    Smoking Cessation  Clinical staff conducted group or individual video education with verbal and written material and guidebook.  Patient learns that cigarette smoking and tobacco addiction pose a serious health risk which affects millions of people. Stopping smoking will significantly reduce the risk of heart disease, lung disease, and many forms of cancer. Recommended strategies for quitting are covered, including  working with your doctor to develop a successful plan.  Culinary   Becoming a Financial trader conducted group or individual video education with verbal and written material and guidebook.  Patient learns that cooking at home can be healthy, cost-effective, quick, and puts them in control. Keys to cooking healthy recipes will include looking at your recipe, assessing your equipment needs, planning ahead, making it simple, choosing cost-effective seasonal ingredients, and limiting the use of added fats, salts, and sugars.  Cooking - Breakfast and Snacks  Clinical staff conducted group or individual video education with verbal and written material and guidebook.  Patient learns how important breakfast is to satiety and nutrition through the entire day. Recommendations include key foods to eat during breakfast to help stabilize blood sugar levels and to prevent overeating at meals later in the day. Planning ahead is also a key component.  Cooking - Human resources officer conducted group or individual video education with verbal and written material and guidebook.  Patient learns eating strategies to improve overall health, including an approach to cook more at home. Recommendations include thinking of animal protein as a side on your plate rather than center stage and focusing instead on lower calorie dense options like vegetables, fruits, whole grains, and plant-based proteins, such as beans. Making sauces in large quantities to freeze for later and leaving the skin on your vegetables are also recommended to maximize your experience.  Cooking - Healthy Salads and Dressing Clinical staff conducted group or individual video education with verbal and written material and guidebook.  Patient learns that vegetables, fruits, whole grains, and legumes are the foundations of the [place]. Recommendations include how to incorporate each of these in flavorful and healthy  salads, and how to create homemade salad dressings. Proper handling of ingredients is also covered. Cooking - Soups and [employer] - Soups and Desserts Clinical staff conducted group or individual video education with verbal and written material and guidebook.  Patient learns that Pritikin soups and desserts make for easy, nutritious, and delicious snacks and meal components that are low in sodium, fat, sugar, and calorie density, while high in vitamins, minerals, and filling fiber. Recommendations include simple and healthy ideas for soups and desserts.   Overview     The Pritikin Solution Program Overview Clinical staff conducted group or individual video education with verbal and written material and guidebook.  Patient learns that the results of the Pritikin Program  have been documented in more than 100 articles published in peer-reviewed journals, and the benefits include reducing risk factors for (and, in some cases, even reversing) high cholesterol, high blood pressure, type 2 diabetes, obesity, and more! An overview of the three key pillars of the Pritikin Program will be covered: eating well, doing regular exercise, and having a healthy mind-set.  WORKSHOPS  Exercise: Exercise Basics: Building Your Action Plan Clinical staff led group instruction and group discussion with PowerPoint presentation and patient guidebook. To enhance the learning environment the use of posters, models and videos may be added. At the conclusion of this workshop, patients will comprehend the difference between physical activity and exercise, as well as the benefits of incorporating both, into their routine. Patients will understand the FITT (Frequency, Intensity, Time, and Type) principle and how to use it to build an exercise action plan. In addition, safety concerns and other considerations for exercise and cardiac rehab will be addressed by the presenter. The purpose of this lesson is to promote a  comprehensive and effective weekly exercise routine in order to improve patients' overall level of fitness.   Managing Heart Disease: Your Path to a Healthier Heart Clinical staff led group instruction and group discussion with PowerPoint presentation and patient guidebook. To enhance the learning environment the use of posters, models and videos may be added.At the conclusion of this workshop, patients will understand the anatomy and physiology of the heart. Additionally, they will understand how Pritikin's three pillars impact the risk factors, the progression, and the management of heart disease.  The purpose of this lesson is to provide a high-level overview of the heart, heart disease, and how the Pritikin lifestyle positively impacts risk factors.  Exercise Biomechanics Clinical staff led group instruction and group discussion with PowerPoint presentation and patient guidebook. To enhance the learning environment the use of posters, models and videos may be added. Patients will learn how the structural parts of their bodies function and how these functions impact their daily activities, movement, and exercise. Patients will learn how to promote a neutral spine, learn how to manage pain, and identify ways to improve their physical movement in order to promote healthy living. The purpose of this lesson is to expose patients to common physical limitations that impact physical activity. Participants will learn practical ways to adapt and manage aches and pains, and to minimize their effect on regular exercise. Patients will learn how to maintain good posture while sitting, walking, and lifting.  Balance Training and Fall Prevention  Clinical staff led group instruction and group discussion with PowerPoint presentation and patient guidebook. To enhance the learning environment the use of posters, models and videos may be added. At the conclusion of this workshop, patients will understand the  importance of their sensorimotor skills (vision, proprioception, and the vestibular system) in maintaining their ability to balance as they age. Patients will apply a variety of balancing exercises that are appropriate for their current level of function. Patients will understand the common causes for poor balance, possible solutions to these problems, and ways to modify their physical environment in order to minimize their fall risk. The purpose of this lesson is to teach patients about the importance of maintaining balance as they age and ways to minimize their risk of falling.  WORKSHOPS   Nutrition:  Fueling a Scientist, research (physical sciences) led group instruction and group discussion with PowerPoint presentation and patient guidebook. To enhance the learning environment the use of posters, models and videos may  be added. Patients will review the foundational principles of the [place] and understand what constitutes a serving size in each of the food groups. Patients will also learn Pritikin-friendly foods that are better choices when away from home and review make-ahead meal and snack options. Calorie density will be reviewed and applied to three nutrition priorities: weight maintenance, weight loss, and weight gain. The purpose of this lesson is to reinforce (in a group setting) the key concepts around what patients are recommended to eat and how to apply these guidelines when away from home by planning and selecting Pritikin-friendly options. Patients will understand how calorie density may be adjusted for different weight management goals.  Mindful Eating  Clinical staff led group instruction and group discussion with PowerPoint presentation and patient guidebook. To enhance the learning environment the use of posters, models and videos may be added. Patients will briefly review the concepts of the [place] and the importance of low-calorie dense foods. The concept of mindful  eating will be introduced as well as the importance of paying attention to internal hunger signals. Triggers for non-hunger eating and techniques for dealing with triggers will be explored. The purpose of this lesson is to provide patients with the opportunity to review the basic principles of the [place], discuss the value of eating mindfully and how to measure internal cues of hunger and fullness using the Hunger Scale. Patients will also discuss reasons for non-hunger eating and learn strategies to use for controlling emotional eating.  Targeting Your Nutrition Priorities Clinical staff led group instruction and group discussion with PowerPoint presentation and patient guidebook. To enhance the learning environment the use of posters, models and videos may be added. Patients will learn how to determine their genetic susceptibility to disease by reviewing their family history. Patients will gain insight into the importance of diet as part of an overall healthy lifestyle in mitigating the impact of genetics and other environmental insults. The purpose of this lesson is to provide patients with the opportunity to assess their personal nutrition priorities by looking at their family history, their own health history and current risk factors. Patients will also be able to discuss ways of prioritizing and modifying the [place] for their highest risk areas  Menu  Clinical staff led group instruction and group discussion with PowerPoint presentation and patient guidebook. To enhance the learning environment the use of posters, models and videos may be added. Using menus brought in from [employer], or printed from [employer], patients will apply the [employer] dining out guidelines that were presented in the [employer] video. Patients will also be able to practice these guidelines in a variety of provided scenarios. The purpose of this lesson is to provide  patients with the opportunity to practice hands-on learning of the [place] with actual menus and practice scenarios.  Label Reading Clinical staff led group instruction and group discussion with PowerPoint presentation and patient guidebook. To enhance the learning environment the use of posters, models and videos may be added. Patients will review and discuss the [employer] label reading guidelines presented in [employer]'s Label Reading Educational series video. Using fool labels brought in from local grocery stores and markets, patients will apply the label reading guidelines and determine if the packaged food meet the [employer] guidelines. The purpose of this lesson is to provide patients with the opportunity to review, discuss, and practice hands-on learning of the [employer] Label Reading guidelines with  actual packaged food labels. [place] Workshops are designed to teach patients ways to prepare quick, simple, and affordable recipes at home. The importance of nutrition's role in chronic disease risk reduction is reflected in its emphasis in the overall [employer] program. By learning how to prepare essential core [employer] Eating Plan recipes, patients will increase control over what they eat; be able to customize the flavor of foods without the use of added salt, sugar, or fat; and improve the quality of the food they consume. By learning a set of core recipes which are easily assembled, quickly prepared, and affordable, patients are more likely to prepare more healthy foods at home. These workshops focus on convenient breakfasts, simple entres, side dishes, and desserts which can be prepared with minimal effort and are consistent with nutrition recommendations for cardiovascular risk reduction. Cooking [employer] are taught by a Engineer, materials (RD) who has been trained by the [employer]. The chef or RD has a clear  understanding of the importance of minimizing - if not completely eliminating - added fat, sugar, and sodium in recipes. Throughout the series of [place] Workshop sessions, patients will learn about healthy ingredients and efficient methods of cooking to build confidence in their capability to prepare    Cooking School weekly topics:  Adding Flavor- Sodium-Free  Fast and Healthy Breakfasts  Powerhouse Plant-Based Proteins  Satisfying Salads and Dressings  Simple Sides and Sauces  International Cuisine-Spotlight on the [place] Zones  Delicious Desserts  Savory Soups  [employer] - Meals in a Agricultural consultant Appetizers and Snacks  Comforting Weekend Breakfasts  One-Pot Wonders   Fast Evening Meals  Contractor Your [employer] Plate  WORKSHOPS   Healthy Mindset (Psychosocial):  Focused Goals, Sustainable Changes Clinical staff led group instruction and group discussion with PowerPoint presentation and patient guidebook. To enhance the learning environment the use of posters, models and videos may be added. Patients will be able to apply effective goal setting strategies to establish at least one personal goal, and then take consistent, meaningful action toward that goal. They will learn to identify common barriers to achieving personal goals and develop strategies to overcome them. Patients will also gain an understanding of how our mind-set can impact our ability to achieve goals and the importance of cultivating a positive and growth-oriented mind-set. The purpose of this lesson is to provide patients with a deeper understanding of how to set and achieve personal goals, as well as the tools and strategies needed to overcome common obstacles which may arise along the way.  From Head to Heart: The Power of a Healthy Outlook  Clinical staff led group instruction and group discussion with PowerPoint presentation and patient guidebook. To enhance the learning  environment the use of posters, models and videos may be added. Patients will be able to recognize and describe the impact of emotions and mood on physical health. They will discover the importance of self-care and explore self-care practices which may work for them. Patients will also learn how to utilize the 4 C's to cultivate a healthier outlook and better manage stress and challenges. The purpose of this lesson is to demonstrate to patients how a healthy outlook is an essential part of maintaining good health, especially as they continue their cardiac rehab journey.  Healthy Sleep for a Healthy Heart Clinical staff led group instruction and group discussion with PowerPoint presentation and patient guidebook. To enhance the learning environment  the use of posters, models and videos may be added. At the conclusion of this workshop, patients will be able to demonstrate knowledge of the importance of sleep to overall health, well-being, and quality of life. They will understand the symptoms of, and treatments for, common sleep disorders. Patients will also be able to identify daytime and nighttime behaviors which impact sleep, and they will be able to apply these tools to help manage sleep-related challenges. The purpose of this lesson is to provide patients with a general overview of sleep and outline the importance of quality sleep. Patients will learn about a few of the most common sleep disorders. Patients will also be introduced to the concept of "sleep hygiene," and discover ways to self-manage certain sleeping problems through simple daily behavior changes. Finally, the workshop will motivate patients by clarifying the links between quality sleep and their goals of heart-healthy living.   Recognizing and Reducing Stress Clinical staff led group instruction and group discussion with PowerPoint presentation and patient guidebook. To enhance the learning environment the use of posters, models and videos  may be added. At the conclusion of this workshop, patients will be able to understand the types of stress reactions, differentiate between acute and chronic stress, and recognize the impact that chronic stress has on their health. They will also be able to apply different coping mechanisms, such as reframing negative self-talk. Patients will have the opportunity to practice a variety of stress management techniques, such as deep abdominal breathing, progressive muscle relaxation, and/or guided imagery.  The purpose of this lesson is to educate patients on the role of stress in their lives and to provide healthy techniques for coping with it.  Learning Barriers/Preferences:  Learning Barriers/Preferences - 03/30/23 1336       Learning Barriers/Preferences   Learning Barriers Sight   wears contacts/glasses   Learning Preferences Audio;Verbal Instruction;Computer/Internet;Video;Group Instruction;Written Material;Individual Instruction;Pictoral;Skilled Demonstration             Education Topics:  Knowledge Questionnaire Score:  Knowledge Questionnaire Score - 03/30/23 1142       Knowledge Questionnaire Score   Pre Score 23/24             Core Components/Risk Factors/Patient Goals at Admission:  Personal Goals and Risk Factors at Admission - 03/30/23 1336       Core Components/Risk Factors/Patient Goals on Admission   Diabetes Yes    Intervention Provide education about signs/symptoms and action to take for hypo/hyperglycemia.;Provide education about proper nutrition, including hydration, and aerobic/resistive exercise prescription along with prescribed medications to achieve blood glucose in normal ranges: Fasting glucose 65-99 mg/dL    Expected Outcomes Short Term: Participant verbalizes understanding of the signs/symptoms and immediate care of hyper/hypoglycemia, proper foot care and importance of medication, aerobic/resistive exercise and nutrition plan for blood glucose  control.;Long Term: Attainment of HbA1C < 7%.    Intervention Provide education on lifestyle modifcations including regular physical activity/exercise, weight management, moderate sodium restriction and increased consumption of fresh fruit, vegetables, and low fat dairy, alcohol moderation, and smoking cessation.;Monitor prescription use compliance.    Expected Outcomes Short Term: Continued assessment and intervention until BP is < 140/70mm HG in hypertensive participants. < 130/52mm HG in hypertensive participants with diabetes, heart failure or chronic kidney disease.;Long Term: Maintenance of blood pressure at goal levels.    Lipids Yes    Intervention Provide education and support for participant on nutrition & aerobic/resistive exercise along with prescribed medications to achieve LDL 70mg , HDL >40mg .  Expected Outcomes Short Term: Participant states understanding of desired cholesterol values and is compliant with medications prescribed. Participant is following exercise prescription and nutrition guidelines.;Long Term: Cholesterol controlled with medications as prescribed, with individualized exercise RX and with personalized nutrition plan. Value goals: LDL < 70mg , HDL > 40 mg.    Stress Yes    Intervention Offer individual and/or small group education and counseling on adjustment to heart disease, stress management and health-related lifestyle change. Teach and support self-help strategies.;Refer participants experiencing significant psychosocial distress to appropriate mental health specialists for further evaluation and treatment. When possible, include family members and significant others in education/counseling sessions.    Expected Outcomes Short Term: Participant demonstrates changes in health-related behavior, relaxation and other stress management skills, ability to obtain effective social support, and compliance with psychotropic medications if prescribed.;Long Term: Emotional  wellbeing is indicated by absence of clinically significant psychosocial distress or social isolation.             Core Components/Risk Factors/Patient Goals Review:   Goals and Risk Factor Review     Row Name 04/25/23 1726             Core Components/Risk Factors/Patient Goals Review   Personal Goals Review Weight Management/Obesity;Stress;Hypertension;Lipids;Diabetes       Review Catiana has been doing well with exercise at intensive cardiac rehab. Vital signs and CBG's have been stable. Rolla was recently started on Januvia       Expected Outcomes Rhya will continnue to participate in intensive cardiac rehab for exercise, nutrition and lifestyle modifications                Core Components/Risk Factors/Patient Goals at Discharge (Final Review):   Goals and Risk Factor Review - 04/25/23 1726       Core Components/Risk Factors/Patient Goals Review   Personal Goals Review Weight Management/Obesity;Stress;Hypertension;Lipids;Diabetes    Review Babbette has been doing well with exercise at intensive cardiac rehab. Vital signs and CBG's have been stable. Heidie was recently started on Januvia    Expected Outcomes Calesha will continnue to participate in intensive cardiac rehab for exercise, nutrition and lifestyle modifications             ITP Comments:  ITP Comments     Row Name 03/30/23 0823 04/04/23 1047 04/25/23 1721       ITP Comments Dr. Armanda Magic medical director. Introduction to pritikin education/ intensive cardiac rehab. Initial orientation packet reviewed with patient. 30 Day ITP Review. Kimberely started intensive cardiac rehab on 04/03/23 and did well with exercise 30 Day ITP Review. Michalla is doing well with exercise at cardiac rehab. Jolene will be out of town nest week to visit her new grand child!              Comments: See ITP comments.

## 2023-04-26 ENCOUNTER — Encounter (HOSPITAL_COMMUNITY)
Admission: RE | Admit: 2023-04-26 | Discharge: 2023-04-26 | Disposition: A | Discharge status: Home / Self Care | Payer: BC Managed Care – PPO | Source: Ambulatory Visit | Attending: Internal Medicine | Admitting: Internal Medicine

## 2023-04-26 DIAGNOSIS — I214 Non-ST elevation (NSTEMI) myocardial infarction: Secondary | ICD-10-CM

## 2023-04-26 DIAGNOSIS — Z955 Presence of coronary angioplasty implant and graft: Secondary | ICD-10-CM | POA: Diagnosis not present

## 2023-04-28 ENCOUNTER — Encounter (HOSPITAL_COMMUNITY): Payer: BC Managed Care – PPO

## 2023-05-01 ENCOUNTER — Encounter (HOSPITAL_COMMUNITY): Payer: BC Managed Care – PPO

## 2023-05-03 ENCOUNTER — Encounter (HOSPITAL_COMMUNITY): Payer: BC Managed Care – PPO

## 2023-05-05 ENCOUNTER — Encounter (HOSPITAL_COMMUNITY): Payer: BC Managed Care – PPO

## 2023-05-08 ENCOUNTER — Encounter (HOSPITAL_COMMUNITY)
Admission: RE | Admit: 2023-05-08 | Discharge: 2023-05-08 | Disposition: A | Discharge status: Home / Self Care | Payer: BC Managed Care – PPO | Source: Ambulatory Visit | Attending: Internal Medicine | Admitting: Internal Medicine

## 2023-05-08 DIAGNOSIS — Z955 Presence of coronary angioplasty implant and graft: Secondary | ICD-10-CM

## 2023-05-08 DIAGNOSIS — I214 Non-ST elevation (NSTEMI) myocardial infarction: Secondary | ICD-10-CM

## 2023-05-10 ENCOUNTER — Encounter (HOSPITAL_COMMUNITY)
Admission: RE | Admit: 2023-05-10 | Discharge: 2023-05-10 | Disposition: A | Discharge status: Home / Self Care | Payer: BC Managed Care – PPO | Source: Ambulatory Visit | Attending: Internal Medicine | Admitting: Internal Medicine

## 2023-05-10 DIAGNOSIS — Z955 Presence of coronary angioplasty implant and graft: Secondary | ICD-10-CM

## 2023-05-10 DIAGNOSIS — I214 Non-ST elevation (NSTEMI) myocardial infarction: Secondary | ICD-10-CM

## 2023-05-12 ENCOUNTER — Telehealth (HOSPITAL_COMMUNITY): Payer: Self-pay

## 2023-05-12 ENCOUNTER — Encounter (HOSPITAL_COMMUNITY)
Admission: RE | Admit: 2023-05-12 | Discharge: 2023-05-12 | Disposition: A | Discharge status: Home / Self Care | Payer: BC Managed Care – PPO | Source: Ambulatory Visit | Attending: Internal Medicine | Admitting: Internal Medicine

## 2023-05-12 DIAGNOSIS — Z955 Presence of coronary angioplasty implant and graft: Secondary | ICD-10-CM

## 2023-05-12 DIAGNOSIS — I214 Non-ST elevation (NSTEMI) myocardial infarction: Secondary | ICD-10-CM

## 2023-05-12 NOTE — Telephone Encounter (Signed)
Called to inform pt of no CRP2 due to telemetry downtime. No answer, VM full.   Jonna Coup, MS, ACSM-CEP 05/12/2023 7:42 AM

## 2023-05-15 ENCOUNTER — Encounter (HOSPITAL_COMMUNITY): Admission: RE | Admit: 2023-05-15 | Payer: BC Managed Care – PPO | Source: Ambulatory Visit

## 2023-05-15 DIAGNOSIS — Z955 Presence of coronary angioplasty implant and graft: Secondary | ICD-10-CM | POA: Diagnosis not present

## 2023-05-15 DIAGNOSIS — I214 Non-ST elevation (NSTEMI) myocardial infarction: Secondary | ICD-10-CM

## 2023-05-17 ENCOUNTER — Encounter (HOSPITAL_COMMUNITY): Admission: RE | Admit: 2023-05-17 | Payer: BC Managed Care – PPO | Source: Ambulatory Visit

## 2023-05-17 DIAGNOSIS — I214 Non-ST elevation (NSTEMI) myocardial infarction: Secondary | ICD-10-CM

## 2023-05-17 DIAGNOSIS — Z955 Presence of coronary angioplasty implant and graft: Secondary | ICD-10-CM

## 2023-05-19 ENCOUNTER — Encounter (HOSPITAL_COMMUNITY)
Admission: RE | Admit: 2023-05-19 | Discharge: 2023-05-19 | Disposition: A | Discharge status: Home / Self Care | Payer: BC Managed Care – PPO | Source: Ambulatory Visit | Attending: Internal Medicine | Admitting: Internal Medicine

## 2023-05-19 DIAGNOSIS — Z955 Presence of coronary angioplasty implant and graft: Secondary | ICD-10-CM | POA: Diagnosis not present

## 2023-05-19 DIAGNOSIS — I214 Non-ST elevation (NSTEMI) myocardial infarction: Secondary | ICD-10-CM

## 2023-05-19 NOTE — Progress Notes (Signed)
Cardiology Office Note:    Date:  05/29/2023   ID:  Angela Knight, DOB [birth year], MRN [id number]  PCP:  Delorse Lek, MD   [facility] Providers Cardiologist:  Christell Constant, MD     Referring MD: Delorse Lek, MD   Chief Complaint: post hospital follow-up s/p PCI  History of Present Illness:    Angela Knight is a very pleasant 52 y.o. female with a hx of CAD, HTN, type II DM, and HLD.  She reports family history of diabetes, hypertension, and hyperlipidemia but neither parent had cardiac events.   Presented to ED on 03/14/23 for evaluation of chest pain.  She started having retrosternal pressure-like sensation radiating to her left shoulder a few days prior to admission.  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  She had pressure with shortness of breath.  The discomfort was triggered by exertion and relieved with rest, lasting 5 to 10 minutes. Hs trop 57 >>94 >> 463. EKG revealed inferior TWI. Echo 03/15/23 revealed normal LVEF 65-70%, no rwma, normal diastolic parameters, normal RV, no significant valve disease. Cardiac cath 03/15/23 revealed severe multivessel CAD with complex LAD/diagonal bifurcation stenosis in very large LAD and large diagonal vessel both extending to LV apex, high grade 95% mRCA stenosis followed by aneursymal dilatation of RCA in dominant RCA, normal LCx, consider surgical consultation. She returned to cath lab on 03/17/23 and underwent DES to [facility] and DES to mLAD-1 lesion with residual mid LAD-2 lesion 20%, and 2nd diag 35%. Jailed diagonal maintained TIMI-3 flow. Plan for DAPT x 12 months. She was discharged on recent atorvastatin 80 mg, low-dose metoprolol, losartan, Brilinta, and aspirin.  Seen by me for post cath follow-up on 03/22/23, accompanied by her husband. She reported right arm pain since first cath on 03/15/23. Has bruising on lateral aspect of right arm. Had pain during the cath and it has persisted since that time. Has been  gradually increasing activity at home. No chest pain, shortness of breath, dyspnea, palpitations, orthopnea, PND, presyncope, syncope. Does not have a BP monitor at home. Lengthy discussion about lifestyle changes for secondary prevention. Reports slight cough over the past few days. Previously intolerant of lisinopril due to cough.  Her discharge instructions unfortunately did not include aspirin.  She was advised to start aspirin 81 mg daily.  She was cleared to proceed with cardiac rehab.   Today, she is here for 3 month follow-up. Reports she is feeling well.  Continues to attend cardiac rehab, will complete the course soon. Is now on [facility] which has been difficult to adjust to due to side effects, nausea, gas pain, however her A1c has significantly improved and so she is trying to push through. No specific cardiac concerns. She denies chest pain, shortness of breath, lower extremity edema, fatigue, palpitations, melena, presyncope, syncope, orthopnea, and PND. She and her husband have significantly improved diet and are walking on a regular basis.    Past Medical History:  Diagnosis Date   Car sickness    Complication of anesthesia    severe   Family history of adverse reaction to anesthesia    PONV   GERD (gastroesophageal reflux disease)    occ. OTC med used   History of esophageal stricture    stenosis dilatation 05-02-2016   History of gastric polyp    benign 05-02-2016   PONV (postoperative nausea and vomiting)    SVD (spontaneous vaginal delivery)    x 6  Tubulovillous adenoma of rectum    multiple recurrent's   Wears glasses     Past Surgical History:  Procedure Laterality Date   ANTERIOR AND POSTERIOR REPAIR N/A 05/08/2017   Procedure: ANTERIOR Colporrhaphy (CYSTOCELE) AND POSTERIOR REPAIR (RECTOCELE), PERINEALORRHAPHY;  Surgeon: Olivia Mackie, MD;  Location: [facility];  Service: Gynecology;  Laterality: N/A;   COLONOSCOPY WITH PROPOFOL N/A 05/02/2016   Procedure:  COLONOSCOPY WITH PROPOFOL;  Surgeon: Charolett Bumpers, MD;  Location: [facility];  Service: Endoscopy;  Laterality: N/A;   CORONARY STENT INTERVENTION N/A 03/17/2023   Procedure: CORONARY STENT INTERVENTION;  Surgeon: Corky Crafts, MD;  Location: [facility] INVASIVE CV LAB;  Service: Cardiovascular;  Laterality: N/A;   CORONARY ULTRASOUND/IVUS N/A 03/17/2023   Procedure: Coronary Ultrasound/IVUS;  Surgeon: Corky Crafts, MD;  Location: [facility] INVASIVE CV LAB;  Service: Cardiovascular;  Laterality: N/A;   ESOPHAGOGASTRODUODENOSCOPY (EGD) WITH PROPOFOL N/A 05/02/2016   Procedure: ESOPHAGOGASTRODUODENOSCOPY (EGD) WITH PROPOFOL;  Surgeon: Charolett Bumpers, MD;  Location: [facility];  Service: Endoscopy;  Laterality: N/A;   LEFT HEART CATH AND CORONARY ANGIOGRAPHY N/A 03/15/2023   Procedure: LEFT HEART CATH AND CORONARY ANGIOGRAPHY;  Surgeon: Lennette Bihari, MD;  Location: [facility] INVASIVE CV LAB;  Service: Cardiovascular;  Laterality: N/A;   LEFT HEART CATH AND CORONARY ANGIOGRAPHY N/A 03/17/2023   Procedure: LEFT HEART CATH AND CORONARY ANGIOGRAPHY;  Surgeon: Corky Crafts, MD;  Location: [facility] INVASIVE CV LAB;  Service: Cardiovascular;  Laterality: N/A;   ROBOTIC ASSISTED TOTAL HYSTERECTOMY WITH SALPINGECTOMY Bilateral 05/08/2017   Procedure: ROBOTIC ASSISTED TOTAL HYSTERECTOMY WITH SALPINGECTOMY And Uterosacral Ligament Suspension;  Surgeon: Olivia Mackie, MD;  Location: [facility];  Service: Gynecology;  Laterality: Bilateral;   SIGMOIDOSCOPY  multiple--   TRANSANAL EXCISION OF RECTAL MASS  06-12-2003 and 05-24-2004   tubulovillious adenoma   TUMOR EXCISION N/A 07/05/2016   Procedure: TRANSANAL EXCISION OF TUBULLOVILLOUS ADENOMA OF RECTUM;  Surgeon: Avel Peace, MD;  Location: [facility];  Service: General;  Laterality: N/A;    Current Medications: Current Meds  Medication Sig   aspirin EC 81 MG tablet Take 1 tablet (81 mg total) by mouth daily. Swallow whole.   atorvastatin  (LIPITOR) 80 MG tablet Take 1 tablet (80 mg total) by mouth at bedtime.   famotidine-calcium carbonate-magnesium hydroxide (PEPCID COMPLETE) 10-800-165 MG chewable tablet Chew 1 tablet by mouth every evening.   losartan (COZAAR) 25 MG tablet Take 0.5 tablets (12.5 mg total) by mouth daily.   metFORMIN (GLUCOPHAGE-XR) 500 MG 24 hr tablet Take 500 mg by mouth 2 (two) times daily.   metoprolol tartrate (LOPRESSOR) 25 MG tablet Take 0.5 tablets (12.5 mg total) by mouth 2 (two) times daily.   MOUNJARO 2.5 MG/0.5ML Pen Inject 2.5 mg into the skin once a week.   nitroGLYCERIN (NITROSTAT) 0.4 MG SL tablet Place 1 tablet (0.4 mg total) under the tongue every 5 (five) minutes x 3 doses as needed for chest pain.   OMEPRAZOLE PO Take 1 tablet by mouth daily. Pt unsure of strength   ticagrelor (BRILINTA) 90 MG TABS tablet Take 1 tablet (90 mg total) by mouth 2 (two) times daily.     Allergies:   Codeine, Coffee bean extract [coffea arabica], Other, Lisinopril, Pseudoephedrine, and Surgical lubricant   Social History   Socioeconomic History   Marital status: Married    Spouse name: Not on file   Number of children: Not on file   Years of education: Not on file   Highest education  level: Not on file  Occupational History   Not on file  Tobacco Use   Smoking status: Never   Smokeless tobacco: Never  Vaping Use   Vaping status: Never Used  Substance and Sexual Activity   Alcohol use: No   Drug use: No   Sexual activity: Yes    Birth control/protection: Condom  Other Topics Concern   Not on file  Social History Narrative   Not on file   Social Determinants of Health   Financial Resource Strain: Not on file  Food Insecurity: No Food Insecurity (03/15/2023)   Hunger Vital Sign    Worried About Running Out of Food in the Last Year: Never true    Ran Out of Food in the Last Year: Never true  Transportation Needs: No Transportation Needs (03/15/2023)   PRAPARE - Scientist, research (physical sciences) (Medical): No    Lack of Transportation (Non-Medical): No  Physical Activity: Not on file  Stress: Not on file  Social Connections: Not on file     Family History: The patient's family history includes Cancer in her father and mother; Diabetes in her mother and sister; High blood pressure in her mother.  ROS:   Please see the history of present illness.   All other systems reviewed and are negative.  Labs/Other Studies Reviewed:    The following studies were reviewed today:  LHC and Coronary Intervention 03/17/23   Mid RCA lesion is 90% stenosed.  A drug-eluting stent was successfully placed using a SYNERGY XD 3.50X20, postdilated to greater than 3.75 mm and optimized with intravascular ultrasound.   Post intervention, there is a 0% residual stenosis.   Mid LAD-1 lesion is 90% stenosed.  A drug-eluting stent was successfully placed using a SYNERGY XD 2.75X16, postdilated to greater than 3 mm and optimized with intravascular ultrasound.   Post intervention, there is a 0% residual stenosis.   Mid LAD-2 lesion is 20% stenosed.   2nd Diag lesion is 35% stenosed.  Jailed diagonal maintained TIMI-3 flow.   LV end diastolic pressure is low.   There is no aortic valve stenosis.   Successful two-vessel PCI of the RCA and LAD.  Continue dual antiplatelet therapy for 12 months.  If there are bleeding issues, could stop aspirin early.  Continue aggressive secondary prevention including diabetes control and lipid-lowering therapy.  Mynx closure device was deployed.  Of note, the patient reported severe right arm pain during her diagnostic cath 2 days ago.  I suspect she had severe right radial spasm.  That is why we used the femoral approach  LHC 03/15/23   Mid LAD-1 lesion is 90% stenosed.   2nd Diag lesion is 35% stenosed.   Prox RCA lesion is 95% stenosed.   Mid LAD-2 lesion is 20% stenosed.   Non-stenotic Prox RCA to Mid RCA lesion.   The left ventricular systolic function is  normal.   LV end diastolic pressure is mildly elevated.   The left ventricular ejection fraction is 55-65% by visual estimate.   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   Normal left circumflex coronary artery.   Normal global LV function with EF estimate approximately 50 - 55%.  LVEDP mildly elevated at 21 mmHg.   RECOMMENDATION: Consider surgical consultation in  light of complex large LAD/diagonal bifurcation stenosis and high-grade large dominant RCA.  Will have colleagues review.  Will reinitiate heparin several hours post TR band removal.   Echo 03/15/23 1. Left ventricular ejection fraction, by estimation, is 65 to 70%. The  left ventricle has normal function. The left ventricle has no regional  wall motion abnormalities. There is mild concentric left ventricular  hypertrophy. Left ventricular diastolic  parameters were normal.   2. Right ventricular systolic function is normal. The right ventricular  size is mildly enlarged. Tricuspid regurgitation signal is inadequate for  assessing PA pressure.   3. The mitral valve is normal in structure. Trivial mitral valve  regurgitation.   4. The aortic valve is tricuspid. Aortic valve regurgitation is not  visualized.   5. The inferior vena cava is normal in size with greater than 50%  respiratory variability, suggesting right atrial pressure of 3 mmHg.  Recent Labs: 03/15/2023: Magnesium 2.0 03/31/2023: Hemoglobin 14.5; Platelets 360 05/24/2023: ALT 28; BUN 12; Creatinine, Ser 0.80; Potassium 4.4; Sodium 141  Recent Lipid Panel    Component Value Date/Time   CHOL 102 05/24/2023 0740   TRIG 113 05/24/2023 0740   HDL 35 (L) 05/24/2023 0740   CHOLHDL 2.9 05/24/2023 0740   CHOLHDL 4.0 03/15/2023 1208   VLDL 40  03/15/2023 1208   LDLCALC 46 05/24/2023 0740     Risk Assessment/Calculations:           Physical Exam:    VS:  BP (!) 118/40   Pulse 87   Ht 5\' 6"  (1.676 m)   Wt 190 lb (86.2 kg)   LMP 04/27/2017 (Approximate)   SpO2 95%   BMI 30.67 kg/m     Wt Readings from Last 3 Encounters:  05/29/23 190 lb (86.2 kg)  04/01/23 200 lb (90.7 kg)  03/30/23 201 lb 4.5 oz (91.3 kg)     GEN:  Well nourished, well developed in no acute distress HEENT: Normal NECK: No JVD; No carotid bruits CARDIAC: RRR, no murmurs, rubs, gallops RESPIRATORY:  Clear to auscultation without rales, wheezing or rhonchi  ABDOMEN: Soft, non-tender, non-distended MUSCULOSKELETAL:   Bruising to lateral aspect of right arm, no erythema, swelling, or coolness. 2+ pedal/radial pulses, equal bilaterally SKIN: Warm and dry NEUROLOGIC:  Alert and oriented x 3 PSYCHIATRIC:  Normal affect   EKG:  EKG is not ordered today      Diagnoses:    1. Coronary artery disease involving native coronary artery of native heart without angina pectoris   2. Mixed hyperlipidemia   3. Primary hypertension   4. 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 Coronary intervention 03/17/23 to Mid RCA lesion 90% with DES, mid LAD lesion 90% stenosis with DES, plan for DAPT x 12 months. She is doing well and denies chest pain, dyspnea, or other symptoms concerning for angina.  No indication for further ischemic evaluation at this time. No bleeding concerns. Continue secondary prevention with healthy diet, regular exercise, and medical therapy.  Continue Brilinta, aspirin, atorvastatin, losartan,  metoprolol.  Hyperlipidemia LDL goal < 55: Lab work completed 05/24/23 with LDL improved to 46 from 87 on high dose atorvastatin.  Lp(a) not elevated.  Continue healthy diet, regular exercise, and atorvastatin.  Hypertension: BP is well controlled. No medication changes today. Renal function is stable on lab work completed 05/24/23.      Disposition: 6 months with Dr. Izora Ribas  Medication Adjustments/Labs and Tests Ordered: Current medicines are reviewed at length with the patient today.  Concerns regarding medicines are outlined above.  No orders of the defined types were placed in this encounter.  No orders of the defined types were placed in this encounter.   Patient Instructions  Medication Instructions:   Your physician recommends that you continue on your current medications as directed. Please refer to the Current Medication list given to you today.   *If you need a refill on your cardiac medications before your next appointment, please call your pharmacy*   Lab Work:  None ordered.  If you have labs (blood work) drawn today and your tests are completely normal, you will receive your results only by: MyChart Message (if you have MyChart) OR A paper copy in the mail If you have any lab test that is abnormal or we need to change your treatment, we will call you to review the results.   Testing/Procedures:  None ordered.   Follow-Up: At [facility], you and your health needs are our priority.  As part of our continuing mission to provide you with exceptional heart care, we have created designated Provider Care Teams.  These Care Teams include your primary Cardiologist (physician) and Advanced Practice Providers (APPs -  Physician Assistants and Nurse Practitioners) who all work together to provide you with the care you need, when you need it.  We recommend signing up for the patient portal called "MyChart".  Sign up information is provided on this After  Visit Summary.  MyChart is used to connect with patients for Virtual Visits (Telemedicine).  Patients are able to view lab/test results, encounter notes, upcoming appointments, etc.  Non-urgent messages can be sent to your provider as well.   To learn more about what you can do with MyChart, go to [email or web address].    Your next appointment:   6 month(s)  Provider:   Christell Constant, MD     Other Instructions  Your physician wants you to follow-up in: 6 months.  You will receive a reminder letter in the mail two months in advance. If you don't receive a letter, please call our office to schedule the follow-up appointment.     Signed, Levi Aland, NP  05/29/2023 9:39 AM    [facility]

## 2023-05-22 ENCOUNTER — Encounter (HOSPITAL_COMMUNITY)
Admission: RE | Admit: 2023-05-22 | Discharge: 2023-05-22 | Disposition: A | Discharge status: Home / Self Care | Payer: BC Managed Care – PPO | Source: Ambulatory Visit | Attending: Internal Medicine | Admitting: Internal Medicine

## 2023-05-22 DIAGNOSIS — Z955 Presence of coronary angioplasty implant and graft: Secondary | ICD-10-CM

## 2023-05-22 DIAGNOSIS — I214 Non-ST elevation (NSTEMI) myocardial infarction: Secondary | ICD-10-CM

## 2023-05-24 ENCOUNTER — Ambulatory Visit: Payer: BC Managed Care – PPO | Attending: Internal Medicine

## 2023-05-24 ENCOUNTER — Encounter (HOSPITAL_COMMUNITY)
Admission: RE | Admit: 2023-05-24 | Discharge: 2023-05-24 | Disposition: A | Discharge status: Home / Self Care | Payer: BC Managed Care – PPO | Source: Ambulatory Visit | Attending: Internal Medicine | Admitting: Internal Medicine

## 2023-05-24 DIAGNOSIS — E782 Mixed hyperlipidemia: Secondary | ICD-10-CM

## 2023-05-24 DIAGNOSIS — Z955 Presence of coronary angioplasty implant and graft: Secondary | ICD-10-CM | POA: Diagnosis not present

## 2023-05-24 DIAGNOSIS — I214 Non-ST elevation (NSTEMI) myocardial infarction: Secondary | ICD-10-CM

## 2023-05-24 NOTE — Progress Notes (Signed)
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 Swallow whole., Disp: 90 tablet, Rfl: 3   atorvastatin (LIPITOR) 80 MG tablet, Take 1 tablet (80 mg total) by mouth at bedtime., Disp: 90 tablet, Rfl: 3   estradiol (VIVELLE-DOT) 0.0375 MG/24HR, Place 1 patch onto the skin 2 (two) times a week. (Patient not taking: Reported on 03/30/2023), Disp: , Rfl:    famotidine-calcium carbonate-magnesium hydroxide (PEPCID COMPLETE) 10-800-165 MG chewable tablet, Chew 1 tablet by mouth every evening., Disp: , Rfl:    glipiZIDE (GLUCOTROL XL) 5 MG 24 hr tablet, Take 5 mg by mouth daily., Disp: , Rfl:    losartan (COZAAR) 25 MG tablet, Take 0.5 tablets (12.5 mg total) by mouth daily., Disp: 45 tablet, Rfl: 3   metFORMIN (GLUCOPHAGE-XR) 500 MG 24 hr tablet, Take 500 mg by mouth 2 (two) times daily., Disp: , Rfl:    metoprolol tartrate (LOPRESSOR) 25 MG tablet, Take 0.5 tablets (12.5 mg total) by mouth 2 (two) times daily., Disp: 90 tablet, Rfl: 3   nitroGLYCERIN (NITROSTAT) 0.4 MG SL tablet, Place 1 tablet (0.4 mg total) under the tongue every 5 (five) minutes x 3 doses as needed for chest pain., Disp: 30 tablet, Rfl: 0   OMEPRAZOLE PO, Take 1 tablet by mouth daily. Pt unsure of strength, Disp: , Rfl:    OZEMPIC, 0.25 OR 0.5  MG/DOSE, 2 MG/3ML SOPN, Inject 0.25 mg into the skin once a week. (Patient not taking: Reported on 03/22/2023), Disp: , Rfl:    ticagrelor (BRILINTA) 90 MG TABS tablet, Take 1 tablet (90 mg total) by mouth 2 (two) times daily., Disp: 180 tablet, Rfl: 3   Vibegron 75 MG TABS, Take 1 tablet by mouth daily. (Patient not taking: Reported on 03/22/2023), Disp: , Rfl:   Past Medical History: Past Medical History:  Diagnosis Date   Car sickness    Complication of anesthesia    severe   Family history of adverse reaction to anesthesia    PONV   GERD (gastroesophageal reflux disease)    occ. OTC med used   History of esophageal stricture    stenosis dilatation 05-02-2016   History of gastric polyp    benign 05-02-2016   PONV (postoperative nausea and vomiting)    SVD (spontaneous vaginal delivery)    x 6   Tubulovillous adenoma of rectum    multiple recurrent's   Wears glasses     Tobacco Use: Social History   Tobacco Use  Smoking Status Never  Smokeless Tobacco Never    Labs: Review Flowsheet       Latest Ref Rng & Units 03/15/2023  Labs for ITP Cardiac and Pulmonary Rehab  Cholestrol 0 - 200 mg/dL 696   LDL (calc) 0 - 99 mg/dL 87   HDL-C >29 mg/dL 43   Trlycerides <  150 mg/dL 409   Hemoglobin W1X 4.8 - 5.6 % 6.8     Details            Capillary Blood Glucose: Lab Results  Component Value Date   GLUCAP 134 (H) 04/07/2023   GLUCAP 164 (H) 04/07/2023   GLUCAP 99 04/05/2023   GLUCAP 112 (H) 04/03/2023   GLUCAP 150 (H) 04/03/2023     Exercise Target Goals: Exercise Program Goal: Individual exercise prescription set using results from initial 6 min walk test and THRR while considering  patient's activity barriers and safety.   Exercise Prescription Goal: Initial exercise prescription builds to 30-45 minutes a day of aerobic activity, 2-3 days per week.  Home exercise guidelines will be given to patient during program as part of exercise prescription that the  participant will acknowledge.  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 HR 82 bpm     Max Ex. BP 110/72     2 Minute Post BP 104/62              Oxygen Initial Assessment:   Oxygen Re-Evaluation:   Oxygen Discharge (Final Oxygen Re-Evaluation):   Initial Exercise Prescription:  Initial Exercise Prescription - 03/30/23 0800       Date of Initial Exercise RX and Referring Provider   Date 03/30/23    Referring Provider Ruthe Mannan, MD    Expected Discharge Date 06/09/23      Recumbant Bike   Level 1    RPM 60    Watts 25    Minutes 15    METs 3.6      NuStep   Level 2    SPM 75    Minutes 15    METs 3.6      Prescription Details   Frequency (times per week) 3    Duration Progress to 30 minutes of continuous aerobic without signs/symptoms of physical distress      Intensity   THRR 40-80% of Max Heartrate 67-134    Ratings of Perceived Exertion 11-13    Perceived Dyspnea 0-4      Progression   Progression Continue progressive overload as per policy without signs/symptoms or physical distress.      Resistance  Training   Training  Prescription Yes    Weight 3 lbs    Reps 10-15             Perform Capillary Blood Glucose checks as needed.  Exercise Prescription Changes:   Exercise Prescription Changes     Row Name 04/03/23 1100 04/17/23 1100 04/21/23 1200 05/08/23 1400       Response to Exercise   Blood Pressure (Admit) 104/72 112/66 100/60 96/68    Blood Pressure (Exercise) 152/80 138/70 130/74 130/78    Blood Pressure (Exit) 98/64 98/60 96/60  98/64    Heart Rate (Admit) 60 bpm 61 bpm 58 bpm 59 bpm    Heart Rate (Exercise) 92 bpm 86 bpm 92 bpm 98 bpm    Heart Rate (Exit) 69 bpm 67 bpm 57 bpm 65 bpm    Rating of Perceived Exertion (Exercise) 11 11 11 9     Symptoms None None None None    Comments Pt's first day in the CRP2 program Reviewed METs Reviewed Home exercise Rx Reviewed METs and goals    Duration Continue with 30 min of aerobic exercise without signs/symptoms of physical distress. Continue with 30 min of aerobic exercise without signs/symptoms of physical distress. Continue with 30 min of aerobic exercise without signs/symptoms of physical distress. Continue with 30 min of aerobic exercise without signs/symptoms of physical distress.    Intensity THRR unchanged THRR unchanged THRR unchanged THRR unchanged      Progression   Progression Continue to progress workloads to maintain intensity without signs/symptoms of physical distress. Continue to progress workloads to maintain intensity without signs/symptoms of physical distress. Continue to progress workloads to maintain intensity without signs/symptoms of physical distress. Continue to progress workloads to maintain intensity without signs/symptoms of physical distress.    Average METs 2.25 2.5 2.95 3.2      Resistance Training   Training Prescription Yes Yes Yes Yes    Weight 3 lbs 3 lbs 3 lbs 4 lbs    Reps 10-15 10-15 10-15 10-15    Time 10 Minutes 10 Minutes 10 Minutes 10 Minutes      Interval Training   Interval Training No No No No       Recumbant Bike   Level 1 2 2 3     RPM 78 63 79 44    Watts 23 23 34 --    Minutes -- 15 15 15     METs 2.2 2.4 2.6 3.4      NuStep   Level 2 3 3 3     SPM 90 103 117 104    Minutes 15 15 15 15     METs 2.3 2.6 3.3 2.9      Home Exercise Plan   Plans to continue exercise at -- -- Home (comment) Home (comment)    Frequency -- -- Add 2 additional days to program exercise sessions. Add 2 additional days to program exercise sessions.    Initial Home Exercises Provided -- -- 04/21/23 04/21/23             Exercise Comments:   Exercise Comments     Row Name 04/03/23 1120 04/17/23 1103 04/21/23 1209 05/03/23 0849 05/08/23 1427   Exercise Comments Pt's first day in the CRP2 program. Pt exercised with no complaints. Reviewed METs. Pt is making good progress. Pt does not wish to make any changes at this time. Reviewed home exercise Rx. Pt is currently walking at home 2x/week with her spouse for 30 minutes. Pt is at goal  for 150 minutes/week of exercise. Pt verbalized understanding of the home exericse Rx and was provided a copy. Pt due for MET and goals review today but is out of town. Will review upon patient's return. Reviewed METs and goals. Pt is making progress. Pt will increase to level 3 on the bike next session.            Exercise Goals and Review:   Exercise Goals     Row Name 03/30/23 0825             Exercise Goals   Increase Physical Activity Yes       Intervention Provide advice, education, support and counseling about physical activity/exercise needs.;Develop an individualized exercise prescription for aerobic and resistive training based on initial evaluation findings, risk stratification, comorbidities and participant's personal goals.       Expected Outcomes Short Term: Attend rehab on a regular basis to increase amount of physical activity.;Long Term: Add in home exercise to make exercise part of routine and to increase amount of physical activity.;Long Term:  Exercising regularly at least 3-5 days a week.       Increase Strength and Stamina Yes       Intervention Provide advice, education, support and counseling about physical activity/exercise needs.;Develop an individualized exercise prescription for aerobic and resistive training based on initial evaluation findings, risk stratification, comorbidities and participant's personal goals.       Expected Outcomes Short Term: Increase workloads from initial exercise prescription for resistance, speed, and METs.;Short Term: Perform resistance training exercises routinely during rehab and add in resistance training at home;Long Term: Improve cardiorespiratory fitness, muscular endurance and strength as measured by increased METs and functional capacity ( )       Able to understand and use rate of perceived exertion (RPE) scale Yes       Intervention Provide education and explanation on how to use RPE scale       Expected Outcomes Short Term: Able to use RPE daily in rehab to express subjective intensity level;Long Term:  Able to use RPE to guide intensity level when exercising independently       Knowledge and understanding of Target Heart Rate Range (THRR) Yes       Intervention Provide education and explanation of THRR including how the numbers were predicted and where they are located for reference       Expected Outcomes Short Term: Able to state/look up THRR;Short Term: Able to use daily as guideline for intensity in rehab;Long Term: Able to use THRR to govern intensity when exercising independently       Understanding of Exercise Prescription Yes       Intervention Provide education, explanation, and written materials on patient's individual exercise prescription       Expected Outcomes Short Term: Able to explain program exercise prescription;Long Term: Able to explain home exercise prescription to exercise independently                Exercise Goals Re-Evaluation :  Exercise Goals Re-Evaluation      Row Name 04/03/23 1119 05/08/23 1425           Exercise Goal Re-Evaluation   Exercise Goals Review Increase Physical Activity;Increase Strength and Stamina;Able to understand and use rate of perceived exertion (RPE) scale;Knowledge and understanding of Target Heart Rate Range (THRR);Understanding of Exercise Prescription Increase Physical Activity;Increase Strength and Stamina;Able to understand and use rate of perceived exertion (RPE) scale;Knowledge and understanding of Target Heart Rate Range (THRR);Understanding of  Exercise Prescription      Comments Pt's first day in the CRP2 program. Pt understands the exercise Rx, THRR, and RPE scale. Reviewed METs and goals today. Pt is making good progress on her goals of increased stamina and increassed confidence to exercise. Peak METs are 3.3. Pt is walking at home for 30 minutes 3x/week in addtion to the CRP2 program.      Expected Outcomes Will continue to monitor patient and progress exericse workloads as tolerated. Will continue to monitor patient and progress exericse workloads as tolerated.               Discharge Exercise Prescription (Final Exercise Prescription Changes):  Exercise Prescription Changes - 05/08/23 1400       Response to Exercise   Blood Pressure (Admit) 96/68    Blood Pressure (Exercise) 130/78    Blood Pressure (Exit) 98/64    Heart Rate (Admit) 59 bpm    Heart Rate (Exercise) 98 bpm    Heart Rate (Exit) 65 bpm    Rating of Perceived Exertion (Exercise) 9    Symptoms None    Comments Reviewed METs and goals    Duration Continue with 30 min of aerobic exercise without signs/symptoms of physical distress.    Intensity THRR unchanged      Progression   Progression Continue to progress workloads to maintain intensity without signs/symptoms of physical distress.    Average METs 3.2      Resistance Training   Training Prescription Yes    Weight 4 lbs    Reps 10-15    Time 10 Minutes      Interval  Training   Interval Training No      Recumbant Bike   Level 3    RPM 44    Minutes 15    METs 3.4      NuStep   Level 3    SPM 104    Minutes 15    METs 2.9      Home Exercise Plan   Plans to continue exercise at Home (comment)    Frequency Add 2 additional days to program exercise sessions.    Initial Home Exercises Provided 04/21/23             Nutrition:  Target Goals: Understanding of nutrition guidelines, daily intake of sodium 1500mg , cholesterol 200mg , calories 30% from fat and 7% or less from saturated fats, daily to have 5 or more servings of fruits and vegetables.  Biometrics:  Pre Biometrics - 03/30/23 0900       Pre Biometrics   Waist Circumference 39.5 inches    Hip Circumference 43 inches    Waist to Hip Ratio 0.92 %    Triceps Skinfold 35 mm    % Body Fat 42.1 %    Grip Strength 35 kg    Flexibility 17.5 in    Single Leg Stand 30 seconds              Nutrition Therapy Plan and Nutrition Goals:  Nutrition Therapy & Goals - 05/03/23 1025       Nutrition Therapy   Diet Heart Healthy/Carbohydrate Consistent diet    Drug/Food Interactions Statins/Certain Fruits      Personal Nutrition Goals   Nutrition Goal Patient to identify strategies for reducing cardiovascular risk by attending the [employer] education and nutrition series weekly.    Personal Goal #2 Patient to improve diet quality by using the plate method as a guide for meal planning to include  lean protein/plant protein, fruits, vegetables, whole grains, nonfat dairy as part of a well-balanced diet.    Comments Goals in action. Jalana continues to attend the [employer] and nutrition series regularly. She has improved understanding of reading food labels, calorie density, and benefits of high fiber intake. She is down 5.1# since starting with our program. Her husband is supportive of making lifestyle changes. Patient will benefit from participation in intensive cardiac rehab for  nutrition, exercise, and lifestyle modification.      Intervention Plan   Intervention Prescribe, educate and counsel regarding individualized specific dietary modifications aiming towards targeted core components such as weight, hypertension, lipid management, diabetes, heart failure and other comorbidities.;Nutrition handout(s) given to patient.    Expected Outcomes Short Term Goal: Understand basic principles of dietary content, such as calories, fat, sodium, cholesterol and nutrients.;Long Term Goal: Adherence to prescribed nutrition plan.             Nutrition Assessments:  Nutrition Assessments - 04/03/23 1414       Rate Your Plate Scores   Pre Score 58            MEDIFICTS Score Key: ?70 Need to make dietary changes  40-70 Heart Healthy Diet ? 40 Therapeutic Level Cholesterol Diet   Flowsheet Row INTENSIVE CARDIAC REHAB from 04/03/2023 in [facility] for Heart, Vascular, & Lung Health  Picture Your Plate Total Score on Admission 58      Picture Your Plate Scores: <16 Unhealthy dietary pattern with much room for improvement. 41-50 Dietary pattern unlikely to meet recommendations for good health and room for improvement. 51-60 More healthful dietary pattern, with some room for improvement.  >60 Healthy dietary pattern, although there may be some specific behaviors that could be improved.    Nutrition Goals Re-Evaluation:  Nutrition Goals Re-Evaluation     Row Name 04/03/23 0952 05/03/23 1025           Goals   Current Weight 201 lb 1 oz (91.2 kg) 196 lb 3.4 oz (89 kg)      Comment triglycerides 200, A1c 6.8, LipoproteinA WNL no new labs; most recent labs triglycerides 200, A1c 6.8, LipoproteinA WNL      Expected Outcome Leonarda reports motivation to make dietary changes to aid with blood sugar control and heart health. She reports history of gestational diabetes and DM2; she did try Ozempic but was unable to tolerate side effects. Her  husband is supportive of making lifestyle changes. Patient will benefit from participation in intensive cardiac rehab for nutrition, exercise, and lifestyle modification. Goals in action. Amiyah continues to attend the [employer] and nutrition series regularly. She has improved understanding of reading food labels, calorie density, and benefits of high fiber intake. She is down 5.1# since starting with our program. Her husband is supportive of making lifestyle changes. Patient will benefit from participation in intensive cardiac rehab for nutrition, exercise, and lifestyle modification.               Nutrition Goals Re-Evaluation:  Nutrition Goals Re-Evaluation     Row Name 04/03/23 0952 05/03/23 1025           Goals   Current Weight 201 lb 1 oz (91.2 kg) 196 lb 3.4 oz (89 kg)      Comment triglycerides 200, A1c 6.8, LipoproteinA WNL no new labs; most recent labs triglycerides 200, A1c 6.8, LipoproteinA WNL      Expected Outcome Reniya reports motivation to make dietary changes  to aid with blood sugar control and heart health. She reports history of gestational diabetes and DM2; she did try Ozempic but was unable to tolerate side effects. Her husband is supportive of making lifestyle changes. Patient will benefit from participation in intensive cardiac rehab for nutrition, exercise, and lifestyle modification. Goals in action. Alya continues to attend the [employer] and nutrition series regularly. She has improved understanding of reading food labels, calorie density, and benefits of high fiber intake. She is down 5.1# since starting with our program. Her husband is supportive of making lifestyle changes. Patient will benefit from participation in intensive cardiac rehab for nutrition, exercise, and lifestyle modification.               Nutrition Goals Discharge (Final Nutrition Goals Re-Evaluation):  Nutrition Goals Re-Evaluation - 05/03/23 1025       Goals   Current  Weight 196 lb 3.4 oz (89 kg)    Comment no new labs; most recent labs triglycerides 200, A1c 6.8, LipoproteinA WNL    Expected Outcome Goals in action. Kinnley continues to attend the [employer] and nutrition series regularly. She has improved understanding of reading food labels, calorie density, and benefits of high fiber intake. She is down 5.1# since starting with our program. Her husband is supportive of making lifestyle changes. Patient will benefit from participation in intensive cardiac rehab for nutrition, exercise, and lifestyle modification.             Psychosocial: Target Goals: Acknowledge presence or absence of significant depression and/or stress, maximize coping skills, provide positive support system. Participant is able to verbalize types and ability to use techniques and skills needed for reducing stress and depression.  Initial Review & Psychosocial Screening:  Initial Psych Review & Screening - 03/30/23 0840       Initial Review   Current issues with None Identified      Family Dynamics   Good Support System? Yes    Comments Pt has spouse, 6 sons, 5 siblings and her church family for support      Barriers   Psychosocial barriers to participate in program The patient should benefit from training in stress management and relaxation.      Screening Interventions   Interventions Encouraged to exercise             Quality of Life Scores:  Quality of Life - 03/30/23 1141       Quality of Life   Select Quality of Life      Quality of Life Scores   Health/Function Pre 21.43 %    Socioeconomic Pre 28.13 %    Psych/Spiritual Pre 23.21 %    Family Pre 27.6 %    GLOBAL Pre 24.2 %            Scores of 19 and below usually indicate a poorer quality of life in these areas.  A difference of  2-3 points is a clinically meaningful difference.  A difference of 2-3 points in the total score of the Quality of Life Index has been associated with significant  improvement in overall quality of life, self-image, physical symptoms, and general health in studies assessing change in quality of life.  PHQ-9: Review Flowsheet       03/30/2023  Depression screen PHQ 2/9  Decreased Interest 0  Down, Depressed, Hopeless 1  PHQ - 2 Score 1  Altered sleeping 1  Tired, decreased energy 1  Change in appetite 0  Feeling bad or  failure about yourself  0  Trouble concentrating 0  Moving slowly or fidgety/restless 0  Suicidal thoughts 0  PHQ-9 Score 3  Difficult doing work/chores Somewhat difficult    Details           Interpretation of Total Score  Total Score Depression Severity:  1-4 = Minimal depression, 5-9 = Mild depression, 10-14 = Moderate depression, 15-19 = Moderately severe depression, 20-27 = Severe depression   Psychosocial Evaluation and Intervention:   Psychosocial Re-Evaluation:  Psychosocial Re-Evaluation     Row Name 04/04/23 1053 04/25/23 1723 05/24/23 0902         Psychosocial Re-Evaluation   Current issues with None Identified Current Stress Concerns;History of Depression History of Depression     Comments Will review PHQ-9 in the upcoming week Deni says that 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 Gerren still reports having difficulty sleeping as she has problems with frequent urination Bhavna has not voiced any increased concerns or stressors during exercise at cardiac rehab     Expected Outcomes -- Emlynn will have decreased or controlled depression/ stressors upon completion of intensive cardiac rehab Kassia will have decreased or controlled depression/ stressors upon completion of intensive cardiac rehab     Interventions Encouraged to attend Cardiac Rehabilitation for the exercise Encouraged to attend Cardiac Rehabilitation for the exercise Encouraged to attend Cardiac Rehabilitation for the exercise     Continue Psychosocial Services  Follow up required by staff Follow up required by staff No Follow  up required       Initial Review   Source of Stress Concerns -- Chronic Illness Chronic Illness     Comments -- Will continnue to monitor and offeer support as needed Will continnue to monitor and offeer support as needed              Psychosocial Discharge (Final Psychosocial Re-Evaluation):  Psychosocial Re-Evaluation - 05/24/23 0902       Psychosocial Re-Evaluation   Current issues with History of Depression    Comments Trulie has not voiced any increased concerns or stressors during exercise at cardiac rehab    Expected Outcomes Kristiane will have decreased or controlled depression/ stressors upon completion of intensive cardiac rehab    Interventions Encouraged to attend Cardiac Rehabilitation for the exercise    Continue Psychosocial Services  No Follow up required      Initial Review   Source of Stress Concerns Chronic Illness    Comments Will continnue to monitor and offeer support as needed             Vocational Rehabilitation: Provide vocational rehab assistance to qualifying candidates.   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 Participant will state understanding/return demonstration of topics presented.    Education     Row Name 04/03/23 1100     Education   Cardiac Education Topics Pritikin   Geographical information systems officer Psychosocial   Psychosocial Workshop Recognizing and Reducing Stress   Instruction Review Code 1- Verbalizes Understanding   Class Start Time [phone]  Class Stop Time 0847   Class Time Calculation (min) 35 min    Row Name 04/05/23 1000     Education    Cardiac Education Topics Pritikin   Secondary school teacher School   Educator Dietitian   Weekly Topic Personalizing Your Pritikin Plate   Instruction Review Code 1- Verbalizes Understanding   Class Start Time 0815   Class Stop Time 1610   Class Time Calculation (min) 37 min    Row Name 04/07/23 0900     Education   Cardiac Education Topics Pritikin   Select Core Videos     Core Videos   Educator Exercise Physiologist   Select Psychosocial   Psychosocial Healthy Minds, Bodies, Hearts   Instruction Review Code 1- Verbalizes Understanding   Class Start Time 0815   Class Stop Time 0850   Class Time Calculation (min) 35 min    Row Name 04/10/23 0900     Education   Cardiac Education Topics Pritikin   Glass blower/designer Nutrition   Instruction Review Code 1- Verbalizes Understanding   Class Start Time 0815   Class Stop Time 0858   Class Time Calculation (min) 43 min    Row Name 04/12/23 1400     Education   Cardiac Education Topics Pritikin   Secondary school teacher School   Educator Dietitian   Weekly Topic Delicious Desserts   Instruction Review Code 1- Verbalizes Understanding   Class Start Time [phone]   Class Stop Time 0845   Class Time Calculation (min) 35 min    Row Name 04/14/23 0900     Education   Cardiac Education Topics Pritikin   Select Core Videos     Core Videos   Educator Dietitian   Select Nutrition   Instruction Review Code 1- Verbalizes Understanding   Class Start Time 0810   Class Stop Time 0852   Class Time Calculation (min) 42 min    Row Name 04/17/23 0900     Education   Cardiac Education Topics Pritikin   Select Workshops     Workshops   Educator Exercise Physiologist   Select Exercise   Exercise Workshop Exercise Basics: Building Your Action Plan   Instruction Review Code 1- Verbalizes Understanding   Class Start Time 0808   Class Stop Time 0854   Class  Time Calculation (min) 46 min    Row Name 04/19/23 0900     Education   Cardiac Education Topics Pritikin   Secondary school teacher School   Educator Dietitian   Weekly Topic Tasty Appetizers and Snacks   Instruction Review Code 1- Verbalizes Understanding   Class Start Time [phone]   Class Stop Time 0845   Class Time Calculation (min) 31 min    Row Name 04/21/23 0800     Education   Cardiac Education Topics Pritikin   Select Core Videos     Core Videos   Educator Exercise Physiologist   Select Nutrition   Nutrition Nutrition Action Plan   Instruction Review Code 1- Verbalizes Understanding   Class Start Time 0813   Class Stop Time 0843   Class Time Calculation (min) 30 min    Row Name 04/24/23 0900     Education   Cardiac Education Topics Pritikin   Psychologist, counselling  Select Nutrition   Nutrition Calorie Density   Instruction Review Code 1- Verbalizes Understanding   Class Start Time 0815   Class Stop Time 0857   Class Time Calculation (min) 42 min    Row Name 04/26/23 0900     Education   Cardiac Education Topics Pritikin   Secondary school teacher School   Educator Dietitian   Weekly Topic Efficiency Cooking - Meals in a Snap   Instruction Review Code 1- Verbalizes Understanding   Class Start Time 0815   Class Stop Time 0850   Class Time Calculation (min) 35 min    Row Name 05/08/23 0800     Education   Select Core Videos     Core Videos   Educator Exercise Physiologist   Select Exercise Education   Exercise Education Biomechanial Limitations   Instruction Review Code 1- Verbalizes Understanding   Class Start Time 0810   Class Stop Time 0846   Class Time Calculation (min) 36 min    Row Name 05/10/23 1000     Education   Cardiac Education Topics Pritikin   Orthoptist   Educator Dietitian   Weekly Topic Comforting Weekend Breakfasts   Instruction  Review Code 1- Verbalizes Understanding   Class Start Time 0815   Class Stop Time 0846   Class Time Calculation (min) 31 min    Row Name 05/15/23 0900     Education   Cardiac Education Topics Pritikin   Select Core Videos     Core Videos   Educator Exercise Physiologist   Select Nutrition   Nutrition Facts on Fat   Instruction Review Code 1- Verbalizes Understanding   Class Start Time [phone]   Class Stop Time 0848   Class Time Calculation (min) 32 min    Row Name 05/17/23 1000     Education   Cardiac Education Topics Pritikin   Secondary school teacher School   Educator Dietitian   Weekly Topic Fast Evening Meals   Instruction Review Code 1- Verbalizes Understanding   Class Start Time [phone]   Class Stop Time 0845   Class Time Calculation (min) 33 min    Row Name 05/19/23 0900     Education   Cardiac Education Topics Pritikin   Select Core Videos     Core Videos   Educator Dietitian   Nutrition Vitamins and Minerals   Instruction Review Code 1- Verbalizes Understanding   Class Start Time 0818   Class Stop Time 0857   Class Time Calculation (min) 39 min    Row Name 05/22/23 1000     Education   Cardiac Education Topics Pritikin   Artist Psychosocial   Psychosocial Workshop Healthy Sleep for a Healthy Heart   Instruction Review Code 1- Verbalizes Understanding   Class Start Time [phone]   Class Stop Time 0900   Class Time Calculation (min) 50 min            Core Videos: Exercise    Move It!  Clinical staff conducted group or individual video education with verbal and written material and guidebook.  Patient learns the recommended Pritikin exercise program. Exercise with the goal of living a long, healthy life. Some of the health benefits of exercise include controlled diabetes, healthier blood pressure levels, improved cholesterol levels, improved heart and lung capacity,  improved sleep, and better body composition. Everyone should speak with their doctor before starting or changing an exercise routine.  Biomechanical Limitations Clinical staff conducted group or individual video education with verbal and written material and guidebook.  Patient learns how biomechanical limitations can impact exercise and how we can mitigate and possibly overcome limitations to have an impactful and balanced exercise routine.  Body Composition Clinical staff conducted group or individual video education with verbal and written material and guidebook.  Patient learns that body composition (ratio of muscle mass to fat mass) is a key component to assessing overall fitness, rather than body weight alone. Increased fat mass, especially visceral belly fat, can put [place] at increased risk for metabolic syndrome, type 2 diabetes, heart disease, and even death. It is recommended to combine diet and exercise (cardiovascular and resistance training) to improve your body composition. Seek guidance from your physician and exercise physiologist before implementing an exercise routine.  Exercise Action Plan Clinical staff conducted group or individual video education with verbal and written material and guidebook.  Patient learns the recommended strategies to achieve and enjoy long-term exercise adherence, including variety, self-motivation, self-efficacy, and positive decision making. Benefits of exercise include fitness, good health, weight management, more energy, better sleep, less stress, and overall well-being.  Medical   Heart Disease Risk Reduction Clinical staff conducted group or individual video education with verbal and written material and guidebook.  Patient learns our heart is our most vital organ as it circulates oxygen, nutrients, white blood cells, and hormones throughout the entire body, and carries waste away. Data supports a plant-based eating plan like the Pritikin Program for  its effectiveness in slowing progression of and reversing heart disease. The video provides a number of recommendations to address heart disease.   Metabolic Syndrome and Belly Fat  Clinical staff conducted group or individual video education with verbal and written material and guidebook.  Patient learns what metabolic syndrome is, how it leads to heart disease, and how one can reverse it and keep it from coming back. You have metabolic syndrome if you have 3 of the following 5 criteria: abdominal obesity, high blood pressure, high triglycerides, low HDL cholesterol, and high blood sugar.  Hypertension and Heart Disease Clinical staff conducted group or individual video education with verbal and written material and guidebook.  Patient learns that high blood pressure, or hypertension, is very common in the [place]. Hypertension is largely due to excessive salt intake, but other important risk factors include being overweight, physical inactivity, drinking too much alcohol, smoking, and not eating enough potassium from fruits and vegetables. High blood pressure is a leading risk factor for heart attack, stroke, congestive heart failure, dementia, kidney failure, and premature death. Long-term effects of excessive salt intake include stiffening of the arteries and thickening of heart muscle and organ damage. Recommendations include ways to reduce hypertension and the risk of heart disease.  Diseases of Our Time - Focusing on Diabetes Clinical staff conducted group or individual video education with verbal and written material and guidebook.  Patient learns why the best way to stop diseases of our time is prevention, through food and other lifestyle changes. Medicine (such as prescription pills and surgeries) is often only a Band-Aid on the problem, not a long-term solution. Most common diseases of our time include obesity, type 2 diabetes, hypertension, heart disease, and cancer. The Pritikin  Program is recommended and has been proven to help reduce, reverse, and/or prevent the damaging effects of metabolic syndrome.  Nutrition   Overview of the Pritikin Eating Plan  Clinical staff conducted group or individual video education with verbal and written material and guidebook.  Patient learns about the Pritikin Eating Plan for disease risk reduction. The Pritikin Eating Plan emphasizes a wide variety of unrefined, minimally-processed carbohydrates, like fruits, vegetables, whole grains, and legumes. Go, Caution, and Stop food choices are explained. Plant-based and lean animal proteins are emphasized. Rationale provided for low sodium intake for blood pressure control, low added sugars for blood sugar stabilization, and low added fats and oils for coronary artery disease risk reduction and weight management.  Calorie Density  Clinical staff conducted group or individual video education with verbal and written material and guidebook.  Patient learns about calorie density and how it impacts the Pritikin Eating Plan. Knowing the characteristics of the food you choose will help you decide whether those foods will lead to weight gain or weight loss, and whether you want to consume more or less of them. Weight loss is usually a side effect of the Pritikin Eating Plan because of its focus on low calorie-dense foods.  Label Reading  Clinical staff conducted group or individual video education with verbal and written material and guidebook.  Patient learns about the Pritikin recommended label reading guidelines and corresponding recommendations regarding calorie density, added sugars, sodium content, and whole grains.  Dining Out - Part 1  Clinical staff conducted group or individual video education with verbal and written material and guidebook.  Patient learns that restaurant meals can be sabotaging because they can be so high in calories, fat, sodium, and/or sugar. Patient learns recommended  strategies on how to positively address this and avoid unhealthy pitfalls.  Facts on Fats  Clinical staff conducted group or individual video education with verbal and written material and guidebook.  Patient learns that lifestyle modifications can be just as effective, if not more so, as many medications for lowering your risk of heart disease. A Pritikin lifestyle can help to reduce your risk of inflammation and atherosclerosis (cholesterol build-up, or plaque, in the artery walls). Lifestyle interventions such as dietary choices and physical activity address the cause of atherosclerosis. A review of the types of fats and their impact on blood cholesterol levels, along with dietary recommendations to reduce fat intake is also included.  Nutrition Action Plan  Clinical staff conducted group or individual video education with verbal and written material and guidebook.  Patient learns how to incorporate Pritikin recommendations into their lifestyle. Recommendations include planning and keeping personal health goals in mind as an important part of their success.  Healthy Mind-Set    Healthy Minds, Bodies, Hearts  Clinical staff conducted group or individual video education with verbal and written material and guidebook.  Patient learns how to identify when they are stressed. Video will discuss the impact of that stress, as well as the many benefits of stress management. Patient will also be introduced to stress management techniques. The way we think, act, and feel has an impact on our hearts.  How Our Thoughts Can Heal Our Hearts  Clinical staff conducted group or individual video education with verbal and written material and guidebook.  Patient learns that negative thoughts can cause depression and anxiety. This can result in negative lifestyle behavior and serious health problems. Cognitive behavioral therapy is an effective method to help control our thoughts in order to change and improve our  emotional outlook.  Additional Videos:  Exercise    Improving Performance  Clinical staff conducted  group or individual video education with verbal and written material and guidebook.  Patient learns to use a non-linear approach by alternating intensity levels and lengths of time spent exercising to help burn more calories and lose more body fat. Cardiovascular exercise helps improve heart health, metabolism, hormonal balance, blood sugar control, and recovery from fatigue. Resistance training improves strength, endurance, balance, coordination, reaction time, metabolism, and muscle mass. Flexibility exercise improves circulation, posture, and balance. Seek guidance from your physician and exercise physiologist before implementing an exercise routine and learn your capabilities and proper form for all exercise.  Introduction to Yoga  Clinical staff conducted group or individual video education with verbal and written material and guidebook.  Patient learns about yoga, a discipline of the coming together of mind, breath, and body. The benefits of yoga include improved flexibility, improved range of motion, better posture and core strength, increased lung function, weight loss, and positive self-image. Yoga's heart health benefits include lowered blood pressure, healthier heart rate, decreased cholesterol and triglyceride levels, improved immune function, and reduced stress. Seek guidance from your physician and exercise physiologist before implementing an exercise routine and learn your capabilities and proper form for all exercise.  Medical   Aging: Enhancing Your Quality of Life  Clinical staff conducted group or individual video education with verbal and written material and guidebook.  Patient learns key strategies and recommendations to stay in good physical health and enhance quality of life, such as prevention strategies, having an advocate, securing a Health Care Proxy and Power of Attorney,  and keeping a list of medications and system for tracking them. It also discusses how to avoid risk for bone loss.  Biology of Weight Control  Clinical staff conducted group or individual video education with verbal and written material and guidebook.  Patient learns that weight gain occurs because we consume more calories than we burn (eating more, moving less). Even if your body weight is normal, you may have higher ratios of fat compared to muscle mass. Too much body fat puts you at increased risk for cardiovascular disease, heart attack, stroke, type 2 diabetes, and obesity-related cancers. In addition to exercise, following the Pritikin Eating Plan can help reduce your risk.  Decoding Lab Results  Clinical staff conducted group or individual video education with verbal and written material and guidebook.  Patient learns that lab test reflects one measurement whose values change over time and are influenced by many factors, including medication, stress, sleep, exercise, food, hydration, pre-existing medical conditions, and more. It is recommended to use the knowledge from this video to become more involved with your lab results and evaluate your numbers to speak with your doctor.   Diseases of Our Time - Overview  Clinical staff conducted group or individual video education with verbal and written material and guidebook.  Patient learns that according to the CDC, 50% to 70% of chronic diseases (such as obesity, type 2 diabetes, elevated lipids, hypertension, and heart disease) are avoidable through lifestyle improvements including healthier food choices, listening to satiety cues, and increased physical activity.  Sleep Disorders Clinical staff conducted group or individual video education with verbal and written material and guidebook.  Patient learns how good quality and duration of sleep are important to overall health and well-being. Patient also learns about sleep disorders and how they  impact health along with recommendations to address them, including discussing with a physician.  Nutrition  Dining Out - Part 2 Clinical staff conducted group or individual video education with  verbal and written material and guidebook.  Patient learns how to plan ahead and communicate in order to maximize their dining experience in a healthy and nutritious manner. Included are recommended food choices based on the type of restaurant the patient is visiting.   Fueling a Banker conducted group or individual video education with verbal and written material and guidebook.  There is a strong connection between our food choices and our health. Diseases like obesity and type 2 diabetes are very prevalent and are in large-part due to lifestyle choices. The Pritikin Eating Plan provides plenty of food and hunger-curbing satisfaction. It is easy to follow, affordable, and helps reduce health risks.  Menu Workshop  Clinical staff conducted group or individual video education with verbal and written material and guidebook.  Patient learns that restaurant meals can sabotage health goals because they are often packed with calories, fat, sodium, and sugar. Recommendations include strategies to plan ahead and to communicate with the manager, chef, or server to help order a healthier meal.  Planning Your Eating Strategy  Clinical staff conducted group or individual video education with verbal and written material and guidebook.  Patient learns about the Pritikin Eating Plan and its benefit of reducing the risk of disease. The Pritikin Eating Plan does not focus on calories. Instead, it emphasizes high-quality, nutrient-rich foods. By knowing the characteristics of the foods, we choose, we can determine their calorie density and make informed decisions.  Targeting Your Nutrition Priorities  Clinical staff conducted group or individual video education with verbal and written material and  guidebook.  Patient learns that lifestyle habits have a tremendous impact on disease risk and progression. This video provides eating and physical activity recommendations based on your personal health goals, such as reducing LDL cholesterol, losing weight, preventing or controlling type 2 diabetes, and reducing high blood pressure.  Vitamins and Minerals  Clinical staff conducted group or individual video education with verbal and written material and guidebook.  Patient learns different ways to obtain key vitamins and minerals, including through a recommended healthy diet. It is important to discuss all supplements you take with your doctor.   Healthy Mind-Set    Smoking Cessation  Clinical staff conducted group or individual video education with verbal and written material and guidebook.  Patient learns that cigarette smoking and tobacco addiction pose a serious health risk which affects millions of people. Stopping smoking will significantly reduce the risk of heart disease, lung disease, and many forms of cancer. Recommended strategies for quitting are covered, including working with your doctor to develop a successful plan.  Culinary   Becoming a Set designer conducted group or individual video education with verbal and written material and guidebook.  Patient learns that cooking at home can be healthy, cost-effective, quick, and puts them in control. Keys to cooking healthy recipes will include looking at your recipe, assessing your equipment needs, planning ahead, making it simple, choosing cost-effective seasonal ingredients, and limiting the use of added fats, salts, and sugars.  Cooking - Breakfast and Snacks  Clinical staff conducted group or individual video education with verbal and written material and guidebook.  Patient learns how important breakfast is to satiety and nutrition through the entire day. Recommendations include key foods to eat during breakfast to  help stabilize blood sugar levels and to prevent overeating at meals later in the day. Planning ahead is also a key component.  Cooking - Educational psychologist conducted  group or individual video education with verbal and written material and guidebook.  Patient learns eating strategies to improve overall health, including an approach to cook more at home. Recommendations include thinking of animal protein as a side on your plate rather than center stage and focusing instead on lower calorie dense options like vegetables, fruits, whole grains, and plant-based proteins, such as beans. Making sauces in large quantities to freeze for later and leaving the skin on your vegetables are also recommended to maximize your experience.  Cooking - Healthy Salads and Dressing Clinical staff conducted group or individual video education with verbal and written material and guidebook.  Patient learns that vegetables, fruits, whole grains, and legumes are the foundations of the Pritikin Eating Plan. Recommendations include how to incorporate each of these in flavorful and healthy salads, and how to create homemade salad dressings. Proper handling of ingredients is also covered. Cooking - Soups and [employer] - Soups and Desserts Clinical staff conducted group or individual video education with verbal and written material and guidebook.  Patient learns that Pritikin soups and desserts make for easy, nutritious, and delicious snacks and meal components that are low in sodium, fat, sugar, and calorie density, while high in vitamins, minerals, and filling fiber. Recommendations include simple and healthy ideas for soups and desserts.   Overview     The Pritikin Solution Program Overview Clinical staff conducted group or individual video education with verbal and written material and guidebook.  Patient learns that the results of the Pritikin Program have been documented in more than 100 articles  published in peer-reviewed journals, and the benefits include reducing risk factors for (and, in some cases, even reversing) high cholesterol, high blood pressure, type 2 diabetes, obesity, and more! An overview of the three key pillars of the Pritikin Program will be covered: eating well, doing regular exercise, and having a healthy mind-set.  WORKSHOPS  Exercise: Exercise Basics: Building Your Action Plan Clinical staff led group instruction and group discussion with PowerPoint presentation and patient guidebook. To enhance the learning environment the use of posters, models and videos may be added. At the conclusion of this workshop, patients will comprehend the difference between physical activity and exercise, as well as the benefits of incorporating both, into their routine. Patients will understand the FITT (Frequency, Intensity, Time, and Type) principle and how to use it to build an exercise action plan. In addition, safety concerns and other considerations for exercise and cardiac rehab will be addressed by the presenter. The purpose of this lesson is to promote a comprehensive and effective weekly exercise routine in order to improve patients' overall level of fitness.   Managing Heart Disease: Your Path to a Healthier Heart Clinical staff led group instruction and group discussion with PowerPoint presentation and patient guidebook. To enhance the learning environment the use of posters, models and videos may be added.At the conclusion of this workshop, patients will understand the anatomy and physiology of the heart. Additionally, they will understand how Pritikin's three pillars impact the risk factors, the progression, and the management of heart disease.  The purpose of this lesson is to provide a high-level overview of the heart, heart disease, and how the Pritikin lifestyle positively impacts risk factors.  Exercise Biomechanics Clinical staff led group instruction and group  discussion with PowerPoint presentation and patient guidebook. To enhance the learning environment the use of posters, models and videos may be added. Patients will learn how the structural parts of their bodies function  and how these functions impact their daily activities, movement, and exercise. Patients will learn how to promote a neutral spine, learn how to manage pain, and identify ways to improve their physical movement in order to promote healthy living. The purpose of this lesson is to expose patients to common physical limitations that impact physical activity. Participants will learn practical ways to adapt and manage aches and pains, and to minimize their effect on regular exercise. Patients will learn how to maintain good posture while sitting, walking, and lifting.  Balance Training and Fall Prevention  Clinical staff led group instruction and group discussion with PowerPoint presentation and patient guidebook. To enhance the learning environment the use of posters, models and videos may be added. At the conclusion of this workshop, patients will understand the importance of their sensorimotor skills (vision, proprioception, and the vestibular system) in maintaining their ability to balance as they age. Patients will apply a variety of balancing exercises that are appropriate for their current level of function. Patients will understand the common causes for poor balance, possible solutions to these problems, and ways to modify their physical environment in order to minimize their fall risk. The purpose of this lesson is to teach patients about the importance of maintaining balance as they age and ways to minimize their risk of falling.  WORKSHOPS   Nutrition:  Fueling a Ship broker led group instruction and group discussion with PowerPoint presentation and patient guidebook. To enhance the learning environment the use of posters, models and videos may be added.  Patients will review the foundational principles of the Pritikin Eating Plan and understand what constitutes a serving size in each of the food groups. Patients will also learn Pritikin-friendly foods that are better choices when away from home and review make-ahead meal and snack options. Calorie density will be reviewed and applied to three nutrition priorities: weight maintenance, weight loss, and weight gain. The purpose of this lesson is to reinforce (in a group setting) the key concepts around what patients are recommended to eat and how to apply these guidelines when away from home by planning and selecting Pritikin-friendly options. Patients will understand how calorie density may be adjusted for different weight management goals.  Mindful Eating  Clinical staff led group instruction and group discussion with PowerPoint presentation and patient guidebook. To enhance the learning environment the use of posters, models and videos may be added. Patients will briefly review the concepts of the Pritikin Eating Plan and the importance of low-calorie dense foods. The concept of mindful eating will be introduced as well as the importance of paying attention to internal hunger signals. Triggers for non-hunger eating and techniques for dealing with triggers will be explored. The purpose of this lesson is to provide patients with the opportunity to review the basic principles of the Pritikin Eating Plan, discuss the value of eating mindfully and how to measure internal cues of hunger and fullness using the Hunger Scale. Patients will also discuss reasons for non-hunger eating and learn strategies to use for controlling emotional eating.  Targeting Your Nutrition Priorities Clinical staff led group instruction and group discussion with PowerPoint presentation and patient guidebook. To enhance the learning environment the use of posters, models and videos may be added. Patients will learn how to determine their  genetic susceptibility to disease by reviewing their family history. Patients will gain insight into the importance of diet as part of an overall healthy lifestyle in mitigating the impact of genetics and other environmental  insults. The purpose of this lesson is to provide patients with the opportunity to assess their personal nutrition priorities by looking at their family history, their own health history and current risk factors. Patients will also be able to discuss ways of prioritizing and modifying the Pritikin Eating Plan for their highest risk areas  Menu  Clinical staff led group instruction and group discussion with PowerPoint presentation and patient guidebook. To enhance the learning environment the use of posters, models and videos may be added. Using menus brought in from [employer], or printed from [employer], patients will apply the Pritikin dining out guidelines that were presented in the [employer] video. Patients will also be able to practice these guidelines in a variety of provided scenarios. The purpose of this lesson is to provide patients with the opportunity to practice hands-on learning of the Pritikin Dining Out guidelines with actual menus and practice scenarios.  Label Reading Clinical staff led group instruction and group discussion with PowerPoint presentation and patient guidebook. To enhance the learning environment the use of posters, models and videos may be added. Patients will review and discuss the Pritikin label reading guidelines presented in Pritikin's Label Reading Educational series video. Using fool labels brought in from local grocery stores and markets, patients will apply the label reading guidelines and determine if the packaged food meet the Pritikin guidelines. The purpose of this lesson is to provide patients with the opportunity to review, discuss, and practice hands-on learning of the Pritikin Label Reading guidelines with  actual packaged food labels. Cooking School  Pritikin's [employer] are designed to teach patients ways to prepare quick, simple, and affordable recipes at home. The importance of nutrition's role in chronic disease risk reduction is reflected in its emphasis in the overall Pritikin program. By learning how to prepare essential core Pritikin Eating Plan recipes, patients will increase control over what they eat; be able to customize the flavor of foods without the use of added salt, sugar, or fat; and improve the quality of the food they consume. By learning a set of core recipes which are easily assembled, quickly prepared, and affordable, patients are more likely to prepare more healthy foods at home. These workshops focus on convenient breakfasts, simple entres, side dishes, and desserts which can be prepared with minimal effort and are consistent with nutrition recommendations for cardiovascular risk reduction. Cooking [employer] are taught by a Armed forces logistics/support/administrative officer (RD) who has been trained by the [employer]. The chef or RD has a clear understanding of the importance of minimizing - if not completely eliminating - added fat, sugar, and sodium in recipes. Throughout the series of Cooking School Workshop sessions, patients will learn about healthy ingredients and efficient methods of cooking to build confidence in their capability to prepare    Cooking School weekly topics:  Adding Flavor- Sodium-Free  Fast and Healthy Breakfasts  Powerhouse Plant-Based Proteins  Satisfying Salads and Dressings  Simple Sides and Sauces  International Cuisine-Spotlight on the [employer] Zones  Delicious Desserts  Savory Soups  [employer] - Meals in a Astronomer Appetizers and Snacks  Comforting Weekend Breakfasts  One-Pot Wonders   Fast Evening Meals  Landscape architect Your Pritikin Plate  WORKSHOPS   Healthy Mindset (Psychosocial):  Focused  Goals, Sustainable Changes Clinical staff led group instruction and group discussion with PowerPoint presentation and patient guidebook. To enhance the learning environment the use of posters, models and videos  may be added. Patients will be able to apply effective goal setting strategies to establish at least one personal goal, and then take consistent, meaningful action toward that goal. They will learn to identify common barriers to achieving personal goals and develop strategies to overcome them. Patients will also gain an understanding of how our mind-set can impact our ability to achieve goals and the importance of cultivating a positive and growth-oriented mind-set. The purpose of this lesson is to provide patients with a deeper understanding of how to set and achieve personal goals, as well as the tools and strategies needed to overcome common obstacles which may arise along the way.  From Head to Heart: The Power of a Healthy Outlook  Clinical staff led group instruction and group discussion with PowerPoint presentation and patient guidebook. To enhance the learning environment the use of posters, models and videos may be added. Patients will be able to recognize and describe the impact of emotions and mood on physical health. They will discover the importance of self-care and explore self-care practices which may work for them. Patients will also learn how to utilize the 4 C's to cultivate a healthier outlook and better manage stress and challenges. The purpose of this lesson is to demonstrate to patients how a healthy outlook is an essential part of maintaining good health, especially as they continue their cardiac rehab journey.  Healthy Sleep for a Healthy Heart Clinical staff led group instruction and group discussion with PowerPoint presentation and patient guidebook. To enhance the learning environment the use of posters, models and videos may be added. At the conclusion of this workshop,  patients will be able to demonstrate knowledge of the importance of sleep to overall health, well-being, and quality of life. They will understand the symptoms of, and treatments for, common sleep disorders. Patients will also be able to identify daytime and nighttime behaviors which impact sleep, and they will be able to apply these tools to help manage sleep-related challenges. The purpose of this lesson is to provide patients with a general overview of sleep and outline the importance of quality sleep. Patients will learn about a few of the most common sleep disorders. Patients will also be introduced to the concept of "sleep hygiene," and discover ways to self-manage certain sleeping problems through simple daily behavior changes. Finally, the workshop will motivate patients by clarifying the links between quality sleep and their goals of heart-healthy living.   Recognizing and Reducing Stress Clinical staff led group instruction and group discussion with PowerPoint presentation and patient guidebook. To enhance the learning environment the use of posters, models and videos may be added. At the conclusion of this workshop, patients will be able to understand the types of stress reactions, differentiate between acute and chronic stress, and recognize the impact that chronic stress has on their health. They will also be able to apply different coping mechanisms, such as reframing negative self-talk. Patients will have the opportunity to practice a variety of stress management techniques, such as deep abdominal breathing, progressive muscle relaxation, and/or guided imagery.  The purpose of this lesson is to educate patients on the role of stress in their lives and to provide healthy techniques for coping with it.  Learning Barriers/Preferences:  Learning Barriers/Preferences - 03/30/23 1336       Learning Barriers/Preferences   Learning Barriers Sight   wears contacts/glasses   Learning Preferences  Audio;Verbal Instruction;Computer/Internet;Video;Group Instruction;Written Material;Individual Instruction;Pictoral;Skilled Demonstration  Education Topics:  Knowledge Questionnaire Score:  Knowledge Questionnaire Score - 03/30/23 1142       Knowledge Questionnaire Score   Pre Score 23/24             Core Components/Risk Factors/Patient Goals at Admission:  Personal Goals and Risk Factors at Admission - 03/30/23 1336       Core Components/Risk Factors/Patient Goals on Admission   Diabetes Yes    Intervention Provide education about signs/symptoms and action to take for hypo/hyperglycemia.;Provide education about proper nutrition, including hydration, and aerobic/resistive exercise prescription along with prescribed medications to achieve blood glucose in normal ranges: Fasting glucose 65-99 mg/dL    Expected Outcomes Short Term: Participant verbalizes understanding of the signs/symptoms and immediate care of hyper/hypoglycemia, proper foot care and importance of medication, aerobic/resistive exercise and nutrition plan for blood glucose control.;Long Term: Attainment of HbA1C < 7%.    Intervention Provide education on lifestyle modifcations including regular physical activity/exercise, weight management, moderate sodium restriction and increased consumption of fresh fruit, vegetables, and low fat dairy, alcohol moderation, and smoking cessation.;Monitor prescription use compliance.    Expected Outcomes Short Term: Continued assessment and intervention until BP is < 140/43mm HG in hypertensive participants. < 130/25mm HG in hypertensive participants with diabetes, heart failure or chronic kidney disease.;Long Term: Maintenance of blood pressure at goal levels.    Lipids Yes    Intervention Provide education and support for participant on nutrition & aerobic/resistive exercise along with prescribed medications to achieve LDL 70mg , HDL >40mg .    Expected Outcomes Short  Term: Participant states understanding of desired cholesterol values and is compliant with medications prescribed. Participant is following exercise prescription and nutrition guidelines.;Long Term: Cholesterol controlled with medications as prescribed, with individualized exercise RX and with personalized nutrition plan. Value goals: LDL < 70mg , HDL > 40 mg.    Stress Yes    Intervention Offer individual and/or small group education and counseling on adjustment to heart disease, stress management and health-related lifestyle change. Teach and support self-help strategies.;Refer participants experiencing significant psychosocial distress to appropriate mental health specialists for further evaluation and treatment. When possible, include family members and significant others in education/counseling sessions.    Expected Outcomes Short Term: Participant demonstrates changes in health-related behavior, relaxation and other stress management skills, ability to obtain effective social support, and compliance with psychotropic medications if prescribed.;Long Term: Emotional wellbeing is indicated by absence of clinically significant psychosocial distress or social isolation.             Core Components/Risk Factors/Patient Goals Review:   Goals and Risk Factor Review     Row Name 04/25/23 1726 05/24/23 0905           Core Components/Risk Factors/Patient Goals Review   Personal Goals Review Weight Management/Obesity;Stress;Hypertension;Lipids;Diabetes Weight Management/Obesity;Stress;Hypertension;Lipids;Diabetes      Review Chandria has been doing well with exercise at intensive cardiac rehab. Vital signs and CBG's have been stable. Harryette was recently started on Januvia Jendaya has been doing well with exercise at intensive cardiac rehab. Vital signs and CBG's have been stable. Thela has lost 4.3 kg since starting cardiac rehab      Expected Outcomes Lacandice will continnue to participate in intensive cardiac  rehab for exercise, nutrition and lifestyle modifications Tamekka will continnue to participate in intensive cardiac rehab for exercise, nutrition and lifestyle modifications               Core Components/Risk Factors/Patient Goals at Discharge (Final Review):   Goals and Risk  Factor Review - 05/24/23 0905       Core Components/Risk Factors/Patient Goals Review   Personal Goals Review Weight Management/Obesity;Stress;Hypertension;Lipids;Diabetes    Review Katrianna has been doing well with exercise at intensive cardiac rehab. Vital signs and CBG's have been stable. Darwin has lost 4.3 kg since starting cardiac rehab    Expected Outcomes Bray will continnue to participate in intensive cardiac rehab for exercise, nutrition and lifestyle modifications             ITP Comments:  ITP Comments     Row Name 03/30/23 0823 04/04/23 1047 04/25/23 1721 05/24/23 0900     ITP Comments Dr. Armanda Magic medical director. Introduction to pritikin education/ intensive cardiac rehab. Initial orientation packet reviewed with patient. 30 Day ITP Review. Laquaya started intensive cardiac rehab on 04/03/23 and did well with exercise 30 Day ITP Review. Shamia is doing well with exercise at cardiac rehab. Ladoris will be out of town nest week to visit her new grand child! 30 Day ITP Review. Enga has good attendance and participation in cardiac rehab. Makhia will complete cardiac rehab on 06/09/23             Comments: see ITP comments.Thayer Headings RN BSN

## 2023-05-26 ENCOUNTER — Encounter (HOSPITAL_COMMUNITY)
Admission: RE | Admit: 2023-05-26 | Discharge: 2023-05-26 | Disposition: A | Discharge status: Home / Self Care | Payer: BC Managed Care – PPO | Source: Ambulatory Visit | Attending: Internal Medicine | Admitting: Internal Medicine

## 2023-05-26 DIAGNOSIS — I252 Old myocardial infarction: Secondary | ICD-10-CM | POA: Insufficient documentation

## 2023-05-26 DIAGNOSIS — Z48812 Encounter for surgical aftercare following surgery on the circulatory system: Secondary | ICD-10-CM | POA: Insufficient documentation

## 2023-05-26 DIAGNOSIS — Z955 Presence of coronary angioplasty implant and graft: Secondary | ICD-10-CM | POA: Insufficient documentation

## 2023-05-26 DIAGNOSIS — I214 Non-ST elevation (NSTEMI) myocardial infarction: Secondary | ICD-10-CM | POA: Diagnosis present

## 2023-05-29 ENCOUNTER — Ambulatory Visit: Payer: BC Managed Care – PPO | Admitting: Nurse Practitioner

## 2023-05-29 ENCOUNTER — Encounter: Payer: Self-pay | Admitting: Nurse Practitioner

## 2023-05-29 VITALS — BP 118/40 | HR 87 | Ht 66.0 in | Wt 190.0 lb

## 2023-05-29 DIAGNOSIS — E782 Mixed hyperlipidemia: Secondary | ICD-10-CM | POA: Diagnosis not present

## 2023-05-29 DIAGNOSIS — I2510 Atherosclerotic heart disease of native coronary artery without angina pectoris: Secondary | ICD-10-CM | POA: Diagnosis not present

## 2023-05-29 DIAGNOSIS — I10 Essential (primary) hypertension: Secondary | ICD-10-CM

## 2023-05-29 DIAGNOSIS — E1159 Type 2 diabetes mellitus with other circulatory complications: Secondary | ICD-10-CM

## 2023-05-29 DIAGNOSIS — Z7984 Long term (current) use of oral hypoglycemic drugs: Secondary | ICD-10-CM

## 2023-05-29 NOTE — Patient Instructions (Signed)
Medication Instructions:   Your physician recommends that you continue on your current medications as directed. Please refer to the Current Medication list given to you today.   *If you need a refill on your cardiac medications before your next appointment, please call your pharmacy*   Lab Work:  None ordered.  If you have labs (blood work) drawn today and your tests are completely normal, you will receive your results only by: MyChart Message (if you have MyChart) OR A paper copy in the mail If you have any lab test that is abnormal or we need to change your treatment, we will call you to review the results.   Testing/Procedures:  None ordered.   Follow-Up: At [facility], you and your health needs are our priority.  As part of our continuing mission to provide you with exceptional heart care, we have created designated Provider Care Teams.  These Care Teams include your primary Cardiologist (physician) and Advanced Practice Providers (APPs -  Physician Assistants and Nurse Practitioners) who all work together to provide you with the care you need, when you need it.  We recommend signing up for the patient portal called "MyChart".  Sign up information is provided on this After Visit Summary.  MyChart is used to connect with patients for Virtual Visits (Telemedicine).  Patients are able to view lab/test results, encounter notes, upcoming appointments, etc.  Non-urgent messages can be sent to your provider as well.   To learn more about what you can do with MyChart, go to [email or web address].    Your next appointment:   6 month(s)  Provider:   Christell Constant, MD     Other Instructions  Your physician wants you to follow-up in: 6 months.  You will receive a reminder letter in the mail two months in advance. If you don't receive a letter, please call our office to schedule the follow-up appointment.

## 2023-05-31 ENCOUNTER — Encounter (HOSPITAL_COMMUNITY)
Admission: RE | Admit: 2023-05-31 | Discharge: 2023-05-31 | Disposition: A | Discharge status: Home / Self Care | Payer: BC Managed Care – PPO | Source: Ambulatory Visit | Attending: Internal Medicine | Admitting: Internal Medicine

## 2023-05-31 DIAGNOSIS — I214 Non-ST elevation (NSTEMI) myocardial infarction: Secondary | ICD-10-CM

## 2023-05-31 DIAGNOSIS — Z955 Presence of coronary angioplasty implant and graft: Secondary | ICD-10-CM

## 2023-06-02 ENCOUNTER — Encounter (HOSPITAL_COMMUNITY)
Admission: RE | Admit: 2023-06-02 | Discharge: 2023-06-02 | Disposition: A | Discharge status: Home / Self Care | Payer: BC Managed Care – PPO | Source: Ambulatory Visit | Attending: Internal Medicine | Admitting: Internal Medicine

## 2023-06-02 DIAGNOSIS — I214 Non-ST elevation (NSTEMI) myocardial infarction: Secondary | ICD-10-CM

## 2023-06-02 DIAGNOSIS — Z955 Presence of coronary angioplasty implant and graft: Secondary | ICD-10-CM

## 2023-06-05 ENCOUNTER — Encounter (HOSPITAL_COMMUNITY)
Admission: RE | Admit: 2023-06-05 | Discharge: 2023-06-05 | Disposition: A | Discharge status: Home / Self Care | Payer: BC Managed Care – PPO | Source: Ambulatory Visit | Attending: Internal Medicine | Admitting: Internal Medicine

## 2023-06-05 VITALS — Ht 67.0 in | Wt 187.4 lb

## 2023-06-05 DIAGNOSIS — I214 Non-ST elevation (NSTEMI) myocardial infarction: Secondary | ICD-10-CM

## 2023-06-05 DIAGNOSIS — Z955 Presence of coronary angioplasty implant and graft: Secondary | ICD-10-CM | POA: Diagnosis not present

## 2023-06-07 ENCOUNTER — Encounter (HOSPITAL_COMMUNITY)
Admission: RE | Admit: 2023-06-07 | Discharge: 2023-06-07 | Disposition: A | Discharge status: Home / Self Care | Payer: BC Managed Care – PPO | Source: Ambulatory Visit | Attending: Internal Medicine | Admitting: Internal Medicine

## 2023-06-07 DIAGNOSIS — I214 Non-ST elevation (NSTEMI) myocardial infarction: Secondary | ICD-10-CM

## 2023-06-07 DIAGNOSIS — Z955 Presence of coronary angioplasty implant and graft: Secondary | ICD-10-CM | POA: Diagnosis not present

## 2023-06-08 NOTE — Progress Notes (Signed)
Discharge Progress Report  Patient Details  Name: Angela Knight MRN: [id number] Date of Birth: 1971-06-15 Referring Provider:   Flowsheet Row INTENSIVE CARDIAC REHAB ORIENT from 03/30/2023 in [facility]  Referring Provider Ruthe Mannan, MD        Number of Visits: 73  Reason for Discharge:  Patient reached a stable level of exercise. Patient independent in their exercise. Patient has met program and personal goals.  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  ADL [facility]:   Initial Exercise Prescription:  Initial Exercise Prescription - 03/30/23 0800       Date of Initial Exercise RX and Referring Provider   Date 03/30/23    Referring Provider Ruthe Mannan, MD    Expected Discharge Date 06/09/23      Recumbant Bike   Level 1    RPM 60    Watts 25    Minutes 15    METs 3.6      NuStep   Level 2    SPM 75    Minutes 15    METs 3.6      Prescription Details   Frequency (times per week) 3    Duration Progress to 30 minutes of continuous aerobic without signs/symptoms of physical distress      Intensity   THRR 40-80% of Max Heartrate 67-134    Ratings of Perceived Exertion 11-13    Perceived Dyspnea 0-4      Progression   Progression Continue progressive overload as per policy without signs/symptoms or physical distress.      Resistance Training   Training Prescription Yes    Weight 3 lbs    Reps 10-15             Discharge Exercise Prescription (Final Exercise Prescription Changes):  Exercise Prescription Changes - 06/09/23 1619       Response to Exercise   Blood Pressure (Admit) 104/64    Blood Pressure (Exercise) 150/80    Blood Pressure (Exit) 106/60    Heart Rate (Admit) 62 bpm    Heart Rate (Exit) 118 bpm    Oxygen  Saturation (Admit) 74 %    Rating of Perceived Exertion (Exercise) 11    Symptoms None    Comments Pt graduated from the CRP2 program today    Duration Continue with 30 min of aerobic exercise without signs/symptoms of physical distress.    Intensity THRR unchanged      Progression   Progression Continue to progress workloads to maintain intensity without signs/symptoms of physical distress.    Average METs 4.5      Resistance Training   Training Prescription Yes    Weight 5 lbs    Reps 10-15    Time 10 Minutes      Interval Training   Interval Training No      Recumbant Bike   Level 4    RPM 70    Watts 76    Minutes 15    METs 4.8      NuStep   Level 5    SPM 95    Minutes 15    METs 4.2      Home Exercise Plan   Plans to continue exercise at Home (comment)    Frequency Add 2 additional days to program exercise  sessions.    Initial Home Exercises Provided 04/21/23             Functional Capacity:  6 Minute Walk     Row Name 03/30/23 0937 06/02/23 0904       6 Minute Walk   Phase Initial Discharge    Distance 1450 feet 2130 feet    Distance % Change -- 46.9 %    Distance Feet Change -- 680 ft    Walk Time 6 minutes 6 minutes    # of Rest Breaks 0 0    MPH 2.75 4.03    METS 3.61 5.4    RPE 11 11    Perceived Dyspnea  0 0    VO2 Peak 12.63 18.74    Symptoms Yes (comment) No    Comments Rt forearm pain, throbbing, s/p cath, No SOB --    Resting HR 58 bpm 66 bpm    Resting BP 98/60 106/70    Resting Oxygen Saturation  99 % --    Exercise Oxygen Saturation  during 6 min walk 99 % --    Max Ex. HR 82 bpm 113 bpm    Max Ex. BP 110/72 134/70    2 Minute Post BP 104/62 --             Psychological, QOL, Others - Outcomes: PHQ 2/9:    06/08/2023    9:11 AM 03/30/2023    8:43 AM  Depression screen PHQ 2/9  Decreased Interest 0 0  Down, Depressed, Hopeless 0 1  PHQ - 2 Score 0 1  Altered sleeping 1 1  Tired, decreased energy 1 1  Change in  appetite 0 0  Feeling bad or failure about yourself  0 0  Trouble concentrating 0 0  Moving slowly or fidgety/restless 0 0  Suicidal thoughts 0 0  PHQ-9 Score 2 3  Difficult doing work/chores Not difficult at all Somewhat difficult    Quality of Life:  Quality of Life - 05/31/23 1030       Quality of Life Scores   Health/Function Pre 21.43 %    Health/Function Post 26.7 %    Health/Function % Change 24.59 %    Socioeconomic Pre 28.13 %    Socioeconomic Post 28.94 %    Socioeconomic % Change  2.88 %    Psych/Spiritual Pre 23.21 %    Psych/Spiritual Post 27.21 %    Psych/Spiritual % Change 17.23 %    Family Pre 27.6 %    Family Post 28.8 %    Family % Change 4.35 %    GLOBAL Pre 24.2 %    GLOBAL Post 27.61 %    GLOBAL % Change 14.09 %             Personal Goals: Goals established at orientation with interventions provided to work toward goal.  Personal Goals and Risk Factors at Admission - 03/30/23 1336       Core Components/Risk Factors/Patient Goals on Admission   Diabetes Yes    Intervention Provide education about signs/symptoms and action to take for hypo/hyperglycemia.;Provide education about proper nutrition, including hydration, and aerobic/resistive exercise prescription along with prescribed medications to achieve blood glucose in normal ranges: Fasting glucose 65-99 mg/dL    Expected Outcomes Short Term: Participant verbalizes understanding of the signs/symptoms and immediate care of hyper/hypoglycemia, proper foot care and importance of medication, aerobic/resistive exercise and nutrition plan for blood glucose control.;Long Term: Attainment of HbA1C < 7%.    Intervention  Provide education on lifestyle modifcations including regular physical activity/exercise, weight management, moderate sodium restriction and increased consumption of fresh fruit, vegetables, and low fat dairy, alcohol moderation, and smoking cessation.;Monitor prescription use compliance.     Expected Outcomes Short Term: Continued assessment and intervention until BP is < 140/39mm HG in hypertensive participants. < 130/39mm HG in hypertensive participants with diabetes, heart failure or chronic kidney disease.;Long Term: Maintenance of blood pressure at goal levels.    Lipids Yes    Intervention Provide education and support for participant on nutrition & aerobic/resistive exercise along with prescribed medications to achieve LDL 70mg , HDL >40mg .    Expected Outcomes Short Term: Participant states understanding of desired cholesterol values and is compliant with medications prescribed. Participant is following exercise prescription and nutrition guidelines.;Long Term: Cholesterol controlled with medications as prescribed, with individualized exercise RX and with personalized nutrition plan. Value goals: LDL < 70mg , HDL > 40 mg.    Stress Yes    Intervention Offer individual and/or small group education and counseling on adjustment to heart disease, stress management and health-related lifestyle change. Teach and support self-help strategies.;Refer participants experiencing significant psychosocial distress to appropriate mental health specialists for further evaluation and treatment. When possible, include family members and significant others in education/counseling sessions.    Expected Outcomes Short Term: Participant demonstrates changes in health-related behavior, relaxation and other stress management skills, ability to obtain effective social support, and compliance with psychotropic medications if prescribed.;Long Term: Emotional wellbeing is indicated by absence of clinically significant psychosocial distress or social isolation.              Personal Goals Discharge:  Goals and Risk Factor Review     Row Name 04/25/23 1726 05/24/23 0905 06/08/23 1419 06/16/23 1000       Core Components/Risk Factors/Patient Goals Review   Personal Goals Review Weight  Management/Obesity;Stress;Hypertension;Lipids;Diabetes Weight Management/Obesity;Stress;Hypertension;Lipids;Diabetes Weight Management/Obesity;Stress;Hypertension;Lipids;Diabetes --    Review Jaleesha has been doing well with exercise at intensive cardiac rehab. Vital signs and CBG's have been stable. Famie was recently started on Januvia Atlean has been doing well with exercise at intensive cardiac rehab. Vital signs and CBG's have been stable. Tannia has lost 4.3 kg since starting cardiac rehab Temekia graduated today, 06/09/2023 from the Intensive/Traditional CR program completing 48 total exercise and education sessions. Core component goal met for weight loss. Sundai has lost ~12 pounds during the program with healthy choices and lifestyle modification. She met her goal for blood pressure control. Fani's BPs have been WNL while at rehab and she is compliant with meds. Goal met for A1C<7%. Roselyn's A1c was 6.3% and her blood sugars were WNL during the program. Goal also been met for lipid control with LDL<55. Kealey is compliant with taking her lipid medication. Goal met for decreased stress. 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 For stress relief she likes to exercise. Post-graduation Dona plans to continuing walking daily with her husband. We are proud of the success Shaunelle has made in the program! --    Expected Outcomes Renea will continnue to participate in intensive cardiac rehab for exercise, nutrition and lifestyle modifications Nyisha will continnue to participate in intensive cardiac rehab for exercise, nutrition and lifestyle modifications For Aleiza to continue to exercise, modify her nutrition and lifestyle post CR graduation --  Exercise Goals and Review:  Exercise Goals     Row Name 03/30/23 0825             Exercise Goals   Increase Physical  Activity Yes       Intervention Provide advice, education, support and counseling about physical activity/exercise needs.;Develop an individualized exercise prescription for aerobic and resistive training based on initial evaluation findings, risk stratification, comorbidities and participant's personal goals.       Expected Outcomes Short Term: Attend rehab on a regular basis to increase amount of physical activity.;Long Term: Add in home exercise to make exercise part of routine and to increase amount of physical activity.;Long Term: Exercising regularly at least 3-5 days a week.       Increase Strength and Stamina Yes       Intervention Provide advice, education, support and counseling about physical activity/exercise needs.;Develop an individualized exercise prescription for aerobic and resistive training based on initial evaluation findings, risk stratification, comorbidities and participant's personal goals.       Expected Outcomes Short Term: Increase workloads from initial exercise prescription for resistance, speed, and METs.;Short Term: Perform resistance training exercises routinely during rehab and add in resistance training at home;Long Term: Improve cardiorespiratory fitness, muscular endurance and strength as measured by increased METs and functional capacity ( )       Able to understand and use rate of perceived exertion (RPE) scale Yes       Intervention Provide education and explanation on how to use RPE scale       Expected Outcomes Short Term: Able to use RPE daily in rehab to express subjective intensity level;Long Term:  Able to use RPE to guide intensity level when exercising independently       Knowledge and understanding of Target Heart Rate Range (THRR) Yes       Intervention Provide education and explanation of THRR including how the numbers were predicted and where they are located for reference       Expected Outcomes Short Term: Able to state/look up THRR;Short Term: Able  to use daily as guideline for intensity in rehab;Long Term: Able to use THRR to govern intensity when exercising independently       Understanding of Exercise Prescription Yes       Intervention Provide education, explanation, and written materials on patient's individual exercise prescription       Expected Outcomes Short Term: Able to explain program exercise prescription;Long Term: Able to explain home exercise prescription to exercise independently                Exercise Goals Re-Evaluation:  Exercise Goals Re-Evaluation     Row Name 04/03/23 1119 05/08/23 1425 06/09/23 1621         Exercise Goal Re-Evaluation   Exercise Goals Review Increase Physical Activity;Increase Strength and Stamina;Able to understand and use rate of perceived exertion (RPE) scale;Knowledge and understanding of Target Heart Rate Range (THRR);Understanding of Exercise Prescription Increase Physical Activity;Increase Strength and Stamina;Able to understand and use rate of perceived exertion (RPE) scale;Knowledge and understanding of Target Heart Rate Range (THRR);Understanding of Exercise Prescription Increase Physical Activity;Increase Strength and Stamina;Able to understand and use rate of perceived exertion (RPE) scale;Knowledge and understanding of Target Heart Rate Range (THRR);Understanding of Exercise Prescription     Comments Pt's first day in the CRP2 program. Pt understands the exercise Rx, THRR, and RPE scale. Reviewed METs and goals today. Pt is making good progress on her goals of increased stamina and increassed  confidence to exercise. Peak METs are 3.3. Pt is walking at home for 30 minutes 3x/week in addtion to the CRP2 program. Pt graduated form the CRP2 program today. Pt made good progess and had peak METs of 4.8. Pt's goal to gain confidence in her exercise abilites was achieved. Pt plans to walk and do aerobics at home. 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     Expected Outcomes Will continue to monitor patient and progress exericse workloads as tolerated. Will continue to monitor patient and progress exericse workloads as tolerated. Pt will continue to exercise at home.              Nutrition & Weight - Outcomes:  Pre Biometrics - 03/30/23 0900       Pre Biometrics   Waist Circumference 39.5 inches    Hip Circumference 43 inches    Waist to Hip Ratio 0.92 %    Triceps Skinfold 35 mm    % Body Fat 42.1 %    Grip Strength 35 kg    Flexibility 17.5 in    Single Leg Stand 30 seconds             Post Biometrics - 06/07/23 0840        Post  Biometrics   Height 5\' 7"  (1.702 m)    Weight 85 kg    Waist Circumference 37.5 inches    Hip Circumference 41 inches    Waist to Hip Ratio 0.91 %    BMI (Calculated) 29.34    Triceps Skinfold 32 mm    % Body Fat 39.8 %    Grip Strength 38 kg    Flexibility 18.75 in    Single Leg Stand 30 seconds             Nutrition:  Nutrition Therapy & Goals - 06/09/23 0953       Nutrition Therapy   Diet Heart Healthy/Carbohydrate Consistent diet    Drug/Food Interactions Statins/Certain Fruits      Personal Nutrition Goals   Nutrition Goal Patient to identify strategies for reducing cardiovascular risk by attending the [employer] education and nutrition series weekly.   goal in action.   Personal Goal #2 Patient to improve diet quality by using the plate method as a guide for meal planning to include lean protein/plant protein, fruits, vegetables, whole grains, nonfat dairy as part of a well-balanced diet.   goal in action.   Comments Goals in action. Laykin has attended the [employer] and nutrition series regularly. [employer] today. She has improved understanding of reading food labels, calorie density, saturated fat intake, and benefits of high fiber intake. She is down 14# since starting with our program. Her A1c has improved to a pre-diabetic range of 6.3 and  LDL remains  well controlled at goal of under <55. Her husband is supportive of making lifestyle changes. Patient will continue to benefit from adherance to nutrition recommendations, exercise recommendations, and lifestyle modification.      Intervention Plan   Intervention Prescribe, educate and counsel regarding individualized specific dietary modifications aiming towards targeted core components such as weight, hypertension, lipid management, diabetes, heart failure and other comorbidities.;Nutrition handout(s) given to patient.    Expected Outcomes Short Term Goal: Understand basic principles of dietary content, such as calories, fat, sodium, cholesterol and nutrients.;Long Term Goal: Adherence to prescribed nutrition plan.;Short Term Goal: A plan has been developed with personal nutrition  goals set during dietitian appointment.             Nutrition Discharge:  Nutrition Assessments - 06/07/23 1432       Rate Your Plate Scores   Pre Score 58    Post Score 89             Education Questionnaire Score:  Knowledge Questionnaire Score - 03/30/23 1142       Knowledge Questionnaire Score   Pre Score 23/24            Tomiko graduated today, 06/09/2023 from the Intensive/Traditional CR program completing 48 total exercise and education sessions. Goals reviewed with patient; copy given to patient. Pt maintained good attendance and progressed during her participation in rehab as evidenced by increased METs and workload. Initial PHQ-2/PHQ-9 score 1/3, post scores 0/2. Pt states her mental health is stable. RN reviewed her Quality-of-Life assessment scores. Pre CR score 24.2% which increased to 27.61% post CR. Scores above 19% indicating a positive quality of life. No areas of concern. Pt declines any needs at the time of graduation.   Core component goals: Goal met for weight loss. Anakarina has lost ~12 pounds during the program with healthy choices and lifestyle modification. She met  her goal for blood pressure control. Tawania's BPs have been WNL while at rehab and she is compliant with meds. Goal met for A1C<7%. Harlem's A1c was 6.3%. Goal also been met for lipid control with LDL<55. Nashae is compliant with taking her lipid medication. Goal met for decreased stress. 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 For stress relief she likes to exercise. Post-graduation Sunnye plans to continuing walking daily with her husband. We are proud of the success Raeonna has made in the program!

## 2023-06-09 ENCOUNTER — Encounter (HOSPITAL_COMMUNITY)
Admission: RE | Admit: 2023-06-09 | Discharge: 2023-06-09 | Disposition: A | Discharge status: Home / Self Care | Payer: BC Managed Care – PPO | Source: Ambulatory Visit | Attending: Internal Medicine | Admitting: Internal Medicine

## 2023-06-09 DIAGNOSIS — Z955 Presence of coronary angioplasty implant and graft: Secondary | ICD-10-CM | POA: Diagnosis not present

## 2023-06-09 DIAGNOSIS — I214 Non-ST elevation (NSTEMI) myocardial infarction: Secondary | ICD-10-CM

## 2023-08-28 ENCOUNTER — Encounter: Payer: Self-pay | Admitting: Podiatry

## 2023-08-28 ENCOUNTER — Ambulatory Visit (INDEPENDENT_AMBULATORY_CARE_PROVIDER_SITE_OTHER): Payer: BC Managed Care – PPO

## 2023-08-28 ENCOUNTER — Ambulatory Visit: Payer: BC Managed Care – PPO | Admitting: Podiatry

## 2023-08-28 DIAGNOSIS — M778 Other enthesopathies, not elsewhere classified: Secondary | ICD-10-CM

## 2023-08-28 DIAGNOSIS — M722 Plantar fascial fibromatosis: Secondary | ICD-10-CM

## 2023-08-28 DIAGNOSIS — M2042 Other hammer toe(s) (acquired), left foot: Secondary | ICD-10-CM

## 2023-08-28 DIAGNOSIS — L989 Disorder of the skin and subcutaneous tissue, unspecified: Secondary | ICD-10-CM

## 2023-08-28 NOTE — Progress Notes (Unsigned)
Subjective:   Patient ID: Angela Knight, female   DOB: 52 y.o.   MRN: [id number]   HPI Chief Complaint  Patient presents with   Foot Pain    Left foot pain for several months had a heart attack and was off her feet and now that she is on her feet has much pain.   52 year old female presents the office with above concerns.  She is having pain in her left foot now for several months.  She is a [employer] and she had a heart attack towards the end of the school year and is often on her feet for some time.  No she started to have more pain.  Pain is worse in the morning but also at nighttime.  She has tried Tylenol and ice.  No injuries.  She also has a spot on her left fourth toe that causes pain.  No recent treatment for this.  No other concerns.  Review of Systems  All other systems reviewed and are negative.  Past Medical History:  Diagnosis Date   Car sickness    Complication of anesthesia    severe   Family history of adverse reaction to anesthesia    PONV   GERD (gastroesophageal reflux disease)    occ. OTC med used   History of esophageal stricture    stenosis dilatation 05-02-2016   History of gastric polyp    benign 05-02-2016   PONV (postoperative nausea and vomiting)    SVD (spontaneous vaginal delivery)    x 6   Tubulovillous adenoma of rectum    multiple recurrent's   Wears glasses     Past Surgical History:  Procedure Laterality Date   ANTERIOR AND POSTERIOR REPAIR N/A 05/08/2017   Procedure: ANTERIOR Colporrhaphy (CYSTOCELE) AND POSTERIOR REPAIR (RECTOCELE), PERINEALORRHAPHY;  Surgeon: Olivia Mackie, MD;  Location: [facility];  Service: Gynecology;  Laterality: N/A;   COLONOSCOPY WITH PROPOFOL N/A 05/02/2016   Procedure: COLONOSCOPY WITH PROPOFOL;  Surgeon: Charolett Bumpers, MD;  Location: [facility];  Service: Endoscopy;  Laterality: N/A;   CORONARY STENT INTERVENTION N/A 03/17/2023   Procedure: CORONARY STENT INTERVENTION;  Surgeon: Corky Crafts, MD;   Location: [facility] INVASIVE CV LAB;  Service: Cardiovascular;  Laterality: N/A;   CORONARY ULTRASOUND/IVUS N/A 03/17/2023   Procedure: Coronary Ultrasound/IVUS;  Surgeon: Corky Crafts, MD;  Location: [facility] INVASIVE CV LAB;  Service: Cardiovascular;  Laterality: N/A;   ESOPHAGOGASTRODUODENOSCOPY (EGD) WITH PROPOFOL N/A 05/02/2016   Procedure: ESOPHAGOGASTRODUODENOSCOPY (EGD) WITH PROPOFOL;  Surgeon: Charolett Bumpers, MD;  Location: [facility];  Service: Endoscopy;  Laterality: N/A;   LEFT HEART CATH AND CORONARY ANGIOGRAPHY N/A 03/15/2023   Procedure: LEFT HEART CATH AND CORONARY ANGIOGRAPHY;  Surgeon: Lennette Bihari, MD;  Location: [facility] CV [facility];  Service: Cardiovascular;  Laterality: N/A;   LEFT HEART CATH AND CORONARY ANGIOGRAPHY N/A 03/17/2023   Procedure: LEFT HEART CATH AND CORONARY ANGIOGRAPHY;  Surgeon: Corky Crafts, MD;  Location: [facility] INVASIVE CV LAB;  Service: Cardiovascular;  Laterality: N/A;   ROBOTIC ASSISTED TOTAL HYSTERECTOMY WITH SALPINGECTOMY Bilateral 05/08/2017   Procedure: ROBOTIC ASSISTED TOTAL HYSTERECTOMY WITH SALPINGECTOMY And Uterosacral Ligament Suspension;  Surgeon: Olivia Mackie, MD;  Location: [facility];  Service: Gynecology;  Laterality: Bilateral;   SIGMOIDOSCOPY  multiple--   TRANSANAL EXCISION OF RECTAL MASS  06-12-2003 and 05-24-2004   tubulovillious adenoma   TUMOR EXCISION N/A 07/05/2016   Procedure: TRANSANAL EXCISION OF TUBULLOVILLOUS ADENOMA OF RECTUM;  Surgeon: Avel Peace, MD;  Location:  [facility];  Service: General;  Laterality: N/A;     Current Outpatient Medications:    aspirin EC 81 MG tablet, Take 1 tablet (81 mg total) by mouth daily. Swallow whole., Disp: 90 tablet, Rfl: 3   atorvastatin (LIPITOR) 80 MG tablet, Take 1 tablet (80 mg total) by mouth at bedtime., Disp: 90 tablet, Rfl: 3   famotidine-calcium carbonate-magnesium hydroxide (PEPCID COMPLETE) 10-800-165 MG chewable tablet, Chew 1 tablet by mouth every evening.,  Disp: , Rfl:    losartan (COZAAR) 25 MG tablet, Take 0.5 tablets (12.5 mg total) by mouth daily., Disp: 45 tablet, Rfl: 3   metFORMIN (GLUCOPHAGE-XR) 500 MG 24 hr tablet, Take 500 mg by mouth 2 (two) times daily., Disp: , Rfl:    metoprolol tartrate (LOPRESSOR) 25 MG tablet, Take 0.5 tablets (12.5 mg total) by mouth 2 (two) times daily., Disp: 90 tablet, Rfl: 3   MOUNJARO 2.5 MG/0.5ML Pen, Inject 2.5 mg into the skin once a week., Disp: , Rfl:    nitroGLYCERIN (NITROSTAT) 0.4 MG SL tablet, Place 1 tablet (0.4 mg total) under the tongue every 5 (five) minutes x 3 doses as needed for chest pain., Disp: 30 tablet, Rfl: 0   OMEPRAZOLE PO, Take 1 tablet by mouth daily. Pt unsure of strength, Disp: , Rfl:    ticagrelor (BRILINTA) 90 MG TABS tablet, Take 1 tablet (90 mg total) by mouth 2 (two) times daily., Disp: 180 tablet, Rfl: 3  Allergies  Allergen Reactions   Codeine Nausea And Vomiting   Coffee Bean Extract [Coffea Arabica] Other (See Comments)    Religious dietary preference: no coffee, chocolate OK   Other     All narcotics causes extreme vomitting   Lisinopril Cough   Pseudoephedrine Palpitations   Surgical Lubricant Itching and Rash    Surgical glue          Objective:  Physical Exam  General: AAO x3, NAD  Dermatological: Skin is warm, dry and supple bilateral.  On the lateral aspect the left fourth toe is a hyperkeratotic lesion without any underlying ulceration, drainage or any signs of infection.  No open lesions.  Vascular: Dorsalis Pedis artery and Posterior Tibial artery pedal pulses are 2/4 bilateral with immedate capillary fill time. There is no pain with calf compression, swelling, warmth, erythema.   Neruologic: Grossly intact via light touch bilateral.  Negative Tinel sign.  Musculoskeletal: Digital contractures are present which is resulted in the hyperkeratotic lesion.  She has tenderness palpation on the plantar medial aspect the calcaneal insertion of plantar  fascia on the left but also at the arch of the foot and into the ball of the foot as well.  Unable to elicit any area pinpoint tenderness.  Flexor, extensor tendons appear to be intact.  MMT 5/5.  Gait: Unassisted, Nonantalgic.      Assessment:   52 year old female bilateral foot pain likely plantar fasciitis; hyperkeratotic lesion     Plan:  -Treatment options discussed including all alternatives, risks, and complications -Etiology of symptoms were discussed -X-rays were obtained and reviewed with the patient.  Multiple views of bilateral feet were obtained.  No evidence of acute fracture. -Steroid injection performed bilaterally.  See procedure note below. -Cam boot dispensed for immobilization to help facilitate soft tissue healing.  I would ice the next couple weeks as she starts to improve she can gradually transition to regular shoe as tolerated.  Discussed resting, icing on a regular basis as well.  Unfortunately need to hold off  on anti-inflammatories but also steroids are possible.  Long-term we discussed shoes, good arch supports. -Sharply debrided the hyperkeratotic lesion with any complications or bleeding.  Offloading pads dispensed, toe separator  Procedure: Injection Tendon/Ligament Discussed alternatives, risks, complications and verbal consent was obtained.  Location: Left plantar fascia at the glabrous junction; medial approach. Skin Prep: Alcohol. Injectate: 0.5cc 0.5% marcaine plain, 0.5 cc 2% lidocaine plain and, 1 cc kenalog 10. Disposition: Patient tolerated procedure well. Injection site dressed with a band-aid.  Post-injection care was discussed and return precautions discussed.   Vivi Barrack DPM

## 2023-08-28 NOTE — Patient Instructions (Signed)
Plantar Fasciitis (Heel Spur Syndrome) with Rehab The plantar fascia is a fibrous, ligament-like, soft-tissue structure that spans the bottom of the foot. Plantar fasciitis is a condition that causes pain in the foot due to inflammation of the tissue. SYMPTOMS   Pain and tenderness on the underneath side of the foot.  Pain that worsens with standing or walking. CAUSES  Plantar fasciitis is caused by irritation and injury to the plantar fascia on the underneath side of the foot. Common mechanisms of injury include:  Direct trauma to bottom of the foot.  Damage to a small nerve that runs under the foot where the main fascia attaches to the heel bone.  Stress placed on the plantar fascia due to bone spurs. RISK INCREASES WITH:   Activities that place stress on the plantar fascia (running, jumping, pivoting, or cutting).  Poor strength and flexibility.  Improperly fitted shoes.  Tight calf muscles.  Flat feet.  Failure to warm-up properly before activity.  Obesity. PREVENTION  Warm up and stretch properly before activity.  Allow for adequate recovery between workouts.  Maintain physical fitness:  Strength, flexibility, and endurance.  Cardiovascular fitness.  Maintain a health body weight.  Avoid stress on the plantar fascia.  Wear properly fitted shoes, including arch supports for individuals who have flat feet.  PROGNOSIS  If treated properly, then the symptoms of plantar fasciitis usually resolve without surgery. However, occasionally surgery is necessary.  RELATED COMPLICATIONS   Recurrent symptoms that may result in a chronic condition.  Problems of the lower back that are caused by compensating for the injury, such as limping.  Pain or weakness of the foot during push-off following surgery.  Chronic inflammation, scarring, and partial or complete fascia tear, occurring more often from repeated injections.  TREATMENT  Treatment initially involves the  use of ice and medication to help reduce pain and inflammation. The use of strengthening and stretching exercises may help reduce pain with activity, especially stretches of the Achilles tendon. These exercises may be performed at home or with a therapist. Your caregiver may recommend that you use heel cups of arch supports to help reduce stress on the plantar fascia. Occasionally, corticosteroid injections are given to reduce inflammation. If symptoms persist for greater than 6 months despite non-surgical (conservative), then surgery may be recommended.   MEDICATION   If pain medication is necessary, then nonsteroidal anti-inflammatory medications, such as aspirin and ibuprofen, or other minor pain relievers, such as acetaminophen, are often recommended.  Do not take pain medication within 7 days before surgery.  Prescription pain relievers may be given if deemed necessary by your caregiver. Use only as directed and only as much as you need.  Corticosteroid injections may be given by your caregiver. These injections should be reserved for the most serious cases, because they may only be given a certain number of times.  HEAT AND COLD  Cold treatment (icing) relieves pain and reduces inflammation. Cold treatment should be applied for 10 to 15 minutes every 2 to 3 hours for inflammation and pain and immediately after any activity that aggravates your symptoms. Use ice packs or massage the area with a piece of ice (ice massage).  Heat treatment may be used prior to performing the stretching and strengthening activities prescribed by your caregiver, physical therapist, or athletic trainer. Use a heat pack or soak the injury in warm water.  SEEK IMMEDIATE MEDICAL CARE IF:  Treatment seems to offer no benefit, or the condition worsens.  Any medications   produce adverse side effects.  EXERCISES- RANGE OF MOTION (ROM) AND STRETCHING EXERCISES - Plantar Fasciitis (Heel Spur Syndrome) These exercises  may help you when beginning to rehabilitate your injury. Your symptoms may resolve with or without further involvement from your physician, physical therapist or athletic trainer. While completing these exercises, remember:   Restoring tissue flexibility helps normal motion to return to the joints. This allows healthier, less painful movement and activity.  An effective stretch should be held for at least 30 seconds.  A stretch should never be painful. You should only feel a gentle lengthening or release in the stretched tissue.  RANGE OF MOTION - Toe Extension, Flexion  Sit with your right / left leg crossed over your opposite knee.  Grasp your toes and gently pull them back toward the top of your foot. You should feel a stretch on the bottom of your toes and/or foot.  Hold this stretch for 10 seconds.  Now, gently pull your toes toward the bottom of your foot. You should feel a stretch on the top of your toes and or foot.  Hold this stretch for 10 seconds. Repeat  times. Complete this stretch 3 times per day.   RANGE OF MOTION - Ankle Dorsiflexion, Active Assisted  Remove shoes and sit on a chair that is preferably not on a carpeted surface.  Place right / left foot under knee. Extend your opposite leg for support.  Keeping your heel down, slide your right / left foot back toward the chair until you feel a stretch at your ankle or calf. If you do not feel a stretch, slide your bottom forward to the edge of the chair, while still keeping your heel down.  Hold this stretch for 10 seconds. Repeat 3 times. Complete this stretch 2 times per day.   STRETCH  Gastroc, Standing  Place hands on wall.  Extend right / left leg, keeping the front knee somewhat bent.  Slightly point your toes inward on your back foot.  Keeping your right / left heel on the floor and your knee straight, shift your weight toward the wall, not allowing your back to arch.  You should feel a gentle stretch  in the right / left calf. Hold this position for 10 seconds. Repeat 3 times. Complete this stretch 2 times per day.  STRETCH  Soleus, Standing  Place hands on wall.  Extend right / left leg, keeping the other knee somewhat bent.  Slightly point your toes inward on your back foot.  Keep your right / left heel on the floor, bend your back knee, and slightly shift your weight over the back leg so that you feel a gentle stretch deep in your back calf.  Hold this position for 10 seconds. Repeat 3 times. Complete this stretch 2 times per day.  STRETCH  Gastrocsoleus, Standing  Note: This exercise can place a lot of stress on your foot and ankle. Please complete this exercise only if specifically instructed by your caregiver.   Place the ball of your right / left foot on a step, keeping your other foot firmly on the same step.  Hold on to the wall or a rail for balance.  Slowly lift your other foot, allowing your body weight to press your heel down over the edge of the step.  You should feel a stretch in your right / left calf.  Hold this position for 10 seconds.  Repeat this exercise with a slight bend in your right /   gain both the endurance and the strength needed for everyday activities through controlled exercises. Complete these exercises as instructed by your physician, physical therapist or athletic trainer. Progress the resistance and repetitions only as guided.  STRENGTH - Towel Curls Sit in a chair positioned on a non-carpeted surface. Place your foot on a towel, keeping your heel on the floor. Pull the towel toward your heel by only curling your toes. Keep your heel on the floor. Repeat 3 times.  Complete this exercise 2 times per day.  STRENGTH - Ankle Inversion Secure one end of a rubber exercise band/tubing to a fixed object (table, pole). Loop the other end around your foot just before your toes. Place your fists between your knees. This will focus your strengthening at your ankle. Slowly, pull your big toe up and in, making sure the band/tubing is positioned to resist the entire motion. Hold this position for 10 seconds. Have your muscles resist the band/tubing as it slowly pulls your foot back to the starting position. Repeat 3 times. Complete this exercises 2 times per day.  Document Released: 10/10/2005 Document Revised: 01/02/2012 Document Reviewed: 01/22/2009 [facility] Patient Information 2014 [place], [place].  While at your visit today you received a steroid injection in your foot or ankle to help with your pain. Along with having the steroid medication there is some "numbing" medication in the shot that you received. Due to this you may notice some numbness to the area for the next couple of hours.   I would recommend limiting activity for the next few days to help the steroid injection take affect.    The actually benefit from the steroid injection may take up to 2-7 days to see a difference. You may actually experience a small (as in 10%) INCREASE in pain in the first 24 hours---that is common. It would be best if you can ice the area today and take anti-inflammatory medications (such as Ibuprofen, Motrin, or Aleve) if you are able to take these medications. If you were prescribed another medication to help with the pain go ahead and start that medication today    Things to watch out for that you should contact us or a health care provider urgently would include: 1. Unusual (as in more than 10%) increase in pain 2. New fever > 101.5 3. New swelling or redness of the injected area.  4. Streaking of red lines around the area injected.  If you have any questions or concerns  about this, please give our office a call at [phone].

## 2023-10-05 ENCOUNTER — Other Ambulatory Visit (HOSPITAL_COMMUNITY): Payer: Self-pay

## 2023-10-30 ENCOUNTER — Ambulatory Visit: Payer: 59 | Admitting: Podiatry

## 2023-10-30 ENCOUNTER — Encounter: Payer: Self-pay | Admitting: Podiatry

## 2023-10-30 DIAGNOSIS — M722 Plantar fascial fibromatosis: Secondary | ICD-10-CM | POA: Diagnosis not present

## 2023-10-30 MED ORDER — GABAPENTIN 100 MG PO CAPS: 100.0000 mg | ORAL_CAPSULE | Freq: Every day | ORAL | 0 refills | Status: DC

## 2023-10-30 NOTE — Progress Notes (Signed)
 Subjective: Chief Complaint  Patient presents with   Plantar Fasciitis    RM#13 Follow up on plantar fasciitis patient not doing any better.    53 year old female presents the office for above concerns.  She said overall she is not doing much better.  She did use the cam boot for some time but she still getting pain to the heel.  She does not recall any recent injury or changes otherwise.  Objective: AAO x3, NAD DP/PT pulses palpable bilaterally, CRT less than 3 seconds There is obvious palpation on the plantar medial tubercle of the calcaneus at the insertion of plantar fascia and also to the arch of the foot.  Unable to appreciate any area pinpoint tenderness.  Flexor, extensor tendons are intact.  MMT 5/5. No pain with calf compression, swelling, warmth, erythema  Assessment: Bilateral chronic heel pain, plantar fasciitis  Plan: -All treatment options discussed with the patient including all alternatives, risks, complications.  -Unfortunately need to hold off on oral anti-inflammatories.  She said gabapentin  think that is reasonable to try to see if this will help.  Discussed side effects. -I dispensed an ankle as well as the plantar fascial brace to help support and stretch the plantar fascia to facilitate soft tissue healing. Static or dynamic ankle foot orthosis, including soft interface material, adjustable for fit, for positioning, may be used for minimal ambulation, prefabricated, off-the-shelf was dispensed on the billed date of service  -We discussed shoes, good arch support. -if no improvement consider physical therapy, advanced imaging or EPAT -Patient encouraged to call the office with any questions, concerns, change in symptoms.   Angela Knight DPM

## 2023-10-30 NOTE — Patient Instructions (Signed)
 For instructions on how to put on your Plantar Fascial Brace, please visit [email or web address]   Plantar Fasciitis (Heel Spur Syndrome) with Rehab The plantar fascia is a fibrous, ligament-like, soft-tissue structure that spans the bottom of the foot. Plantar fasciitis is a condition that causes pain in the foot due to inflammation of the tissue. SYMPTOMS   Pain and tenderness on the underneath side of the foot.  Pain that worsens with standing or walking. CAUSES  Plantar fasciitis is caused by irritation and injury to the plantar fascia on the underneath side of the foot. Common mechanisms of injury include:  Direct trauma to bottom of the foot.  Damage to a small nerve that runs under the foot where the main fascia attaches to the heel bone.  Stress placed on the plantar fascia due to bone spurs. RISK INCREASES WITH:   Activities that place stress on the plantar fascia (running, jumping, pivoting, or cutting).  Poor strength and flexibility.  Improperly fitted shoes.  Tight calf muscles.  Flat feet.  Failure to warm-up properly before activity.  Obesity. PREVENTION  Warm up and stretch properly before activity.  Allow for adequate recovery between workouts.  Maintain physical fitness:  Strength, flexibility, and endurance.  Cardiovascular fitness.  Maintain a health body weight.  Avoid stress on the plantar fascia.  Wear properly fitted shoes, including arch supports for individuals who have flat feet.  PROGNOSIS  If treated properly, then the symptoms of plantar fasciitis usually resolve without surgery. However, occasionally surgery is necessary.  RELATED COMPLICATIONS   Recurrent symptoms that may result in a chronic condition.  Problems of the lower back that are caused by compensating for the injury, such as limping.  Pain or weakness of the foot during push-off following surgery.  Chronic inflammation, scarring, and partial or complete  fascia tear, occurring more often from repeated injections.  TREATMENT  Treatment initially involves the use of ice and medication to help reduce pain and inflammation. The use of strengthening and stretching exercises may help reduce pain with activity, especially stretches of the Achilles tendon. These exercises may be performed at home or with a therapist. Your caregiver may recommend that you use heel cups of arch supports to help reduce stress on the plantar fascia. Occasionally, corticosteroid injections are given to reduce inflammation. If symptoms persist for greater than 6 months despite non-surgical (conservative), then surgery may be recommended.   MEDICATION   If pain medication is necessary, then nonsteroidal anti-inflammatory medications, such as aspirin and ibuprofen, or other minor pain relievers, such as acetaminophen, are often recommended.  Do not take pain medication within 7 days before surgery.  Prescription pain relievers may be given if deemed necessary by your caregiver. Use only as directed and only as much as you need.  Corticosteroid injections may be given by your caregiver. These injections should be reserved for the most serious cases, because they may only be given a certain number of times.  HEAT AND COLD  Cold treatment (icing) relieves pain and reduces inflammation. Cold treatment should be applied for 10 to 15 minutes every 2 to 3 hours for inflammation and pain and immediately after any activity that aggravates your symptoms. Use ice packs or massage the area with a piece of ice (ice massage).  Heat treatment may be used prior to performing the stretching and strengthening activities prescribed by your caregiver, physical therapist, or athletic trainer. Use a heat pack or soak the injury in warm water.  SEEK IMMEDIATE MEDICAL  CARE IF:  Treatment seems to offer no benefit, or the condition worsens.  Any medications produce adverse side effects.   EXERCISES- RANGE OF MOTION (ROM) AND STRETCHING EXERCISES - Plantar Fasciitis (Heel Spur Syndrome) These exercises may help you when beginning to rehabilitate your injury. Your symptoms may resolve with or without further involvement from your physician, physical therapist or athletic trainer. While completing these exercises, remember:   Restoring tissue flexibility helps normal motion to return to the joints. This allows healthier, less painful movement and activity.  An effective stretch should be held for at least 30 seconds.  A stretch should never be painful. You should only feel a gentle lengthening or release in the stretched tissue.  RANGE OF MOTION - Toe Extension, Flexion  Sit with your right / left leg crossed over your opposite knee.  Grasp your toes and gently pull them back toward the top of your foot. You should feel a stretch on the bottom of your toes and/or foot.  Hold this stretch for 10 seconds.  Now, gently pull your toes toward the bottom of your foot. You should feel a stretch on the top of your toes and or foot.  Hold this stretch for 10 seconds. Repeat  times. Complete this stretch 3 times per day.   RANGE OF MOTION - Ankle Dorsiflexion, Active Assisted  Remove shoes and sit on a chair that is preferably not on a carpeted surface.  Place right / left foot under knee. Extend your opposite leg for support.  Keeping your heel down, slide your right / left foot back toward the chair until you feel a stretch at your ankle or calf. If you do not feel a stretch, slide your bottom forward to the edge of the chair, while still keeping your heel down.  Hold this stretch for 10 seconds. Repeat 3 times. Complete this stretch 2 times per day.   STRETCH  Gastroc, Standing  Place hands on wall.  Extend right / left leg, keeping the front knee somewhat bent.  Slightly point your toes inward on your back foot.  Keeping your right / left heel on the floor and your  knee straight, shift your weight toward the wall, not allowing your back to arch.  You should feel a gentle stretch in the right / left calf. Hold this position for 10 seconds. Repeat 3 times. Complete this stretch 2 times per day.  STRETCH  Soleus, Standing  Place hands on wall.  Extend right / left leg, keeping the other knee somewhat bent.  Slightly point your toes inward on your back foot.  Keep your right / left heel on the floor, bend your back knee, and slightly shift your weight over the back leg so that you feel a gentle stretch deep in your back calf.  Hold this position for 10 seconds. Repeat 3 times. Complete this stretch 2 times per day.  STRETCH  Gastrocsoleus, Standing  Note: This exercise can place a lot of stress on your foot and ankle. Please complete this exercise only if specifically instructed by your caregiver.   Place the ball of your right / left foot on a step, keeping your other foot firmly on the same step.  Hold on to the wall or a rail for balance.  Slowly lift your other foot, allowing your body weight to press your heel down over the edge of the step.  You should feel a stretch in your right / left calf.  Hold this  position for 10 seconds.  Repeat this exercise with a slight bend in your right / left knee. Repeat 3 times. Complete this stretch 2 times per day.   STRENGTHENING EXERCISES - Plantar Fasciitis (Heel Spur Syndrome)  These exercises may help you when beginning to rehabilitate your injury. They may resolve your symptoms with or without further involvement from your physician, physical therapist or athletic trainer. While completing these exercises, remember:   Muscles can gain both the endurance and the strength needed for everyday activities through controlled exercises.  Complete these exercises as instructed by your physician, physical therapist or athletic trainer. Progress the resistance and repetitions only as guided.  STRENGTH -  Towel Curls  Sit in a chair positioned on a non-carpeted surface.  Place your foot on a towel, keeping your heel on the floor.  Pull the towel toward your heel by only curling your toes. Keep your heel on the floor. Repeat 3 times. Complete this exercise 2 times per day.  STRENGTH - Ankle Inversion  Secure one end of a rubber exercise band/tubing to a fixed object (table, pole). Loop the other end around your foot just before your toes.  Place your fists between your knees. This will focus your strengthening at your ankle.  Slowly, pull your big toe up and in, making sure the band/tubing is positioned to resist the entire motion.  Hold this position for 10 seconds.  Have your muscles resist the band/tubing as it slowly pulls your foot back to the starting position. Repeat 3 times. Complete this exercises 2 times per day.  Document Released: 10/10/2005 Document Revised: 01/02/2012 Document Reviewed: 01/22/2009 [facility] Patient Information 2014 [place], [place].

## 2023-11-27 ENCOUNTER — Encounter: Payer: Self-pay | Admitting: Podiatry

## 2023-11-27 ENCOUNTER — Ambulatory Visit: Payer: 59 | Admitting: Podiatry

## 2023-11-27 DIAGNOSIS — I739 Peripheral vascular disease, unspecified: Secondary | ICD-10-CM

## 2023-11-27 DIAGNOSIS — M722 Plantar fascial fibromatosis: Secondary | ICD-10-CM | POA: Diagnosis not present

## 2023-11-27 MED ORDER — GABAPENTIN 300 MG PO CAPS: 300.0000 mg | ORAL_CAPSULE | Freq: Every day | ORAL | 2 refills | Status: DC

## 2023-11-27 NOTE — Progress Notes (Unsigned)
Subjective: Chief Complaint  Patient presents with   Plantar Fasciitis    RM#14 Follow up on left foot pain not any better nothing is working at this time.    53 year old female presents the office for above concerns.  She states that she is doing about the same and she still in quite a bit of pain.  Due to the pain she has not gone back into the cam boot does help when she wears it.  Today she also describes cramping and pain in her thigh. This is not new. She is concerned with her cardiac issues could there be something going on with her circulation in the leg.  Objective: AAO x3, NAD DP/PT pulses palpable bilaterally, CRT less than 3 seconds There is tenderness to palpation on the plantar medial tubercle of the calcaneus at the insertion of plantar fascia and also to the arch of the foot.  There is no pain with lateral compression of the calcaneus.  Unable to appreciate any area pinpoint tenderness.  Flexor, extensor tendons are intact.  MMT 5/5. No pain with calf compression, swelling, warmth, erythema  Assessment: Chronic heel pain, plantar fasciitis; leg pain  Plan: -All treatment options discussed with the patient including all alternatives, risks, complications.  -Given ongoing symptoms of the heel recommend MRI which is ordered today.  Recommend continue elevation of the cam boot.  Ice, elevation.  Discussed other options for treatment however I like to get the MRI back as well as the circulation test prior to this help determine further treatment plan. -Recommend an ABI to evaluate her circulation given ongoing leg pain.   Return for MRI/circulatoin test results .  Vivi Barrack DPM

## 2023-12-01 ENCOUNTER — Ambulatory Visit
Admission: RE | Admit: 2023-12-01 | Discharge: 2023-12-01 | Disposition: A | Discharge status: Home / Self Care | Payer: 59 | Source: Ambulatory Visit | Attending: Podiatry | Admitting: Podiatry

## 2023-12-01 DIAGNOSIS — I739 Peripheral vascular disease, unspecified: Secondary | ICD-10-CM

## 2023-12-06 ENCOUNTER — Other Ambulatory Visit: Payer: Self-pay | Admitting: Podiatry

## 2023-12-06 ENCOUNTER — Encounter: Payer: Self-pay | Admitting: Podiatry

## 2023-12-06 DIAGNOSIS — I739 Peripheral vascular disease, unspecified: Secondary | ICD-10-CM

## 2023-12-14 ENCOUNTER — Other Ambulatory Visit: Payer: Self-pay

## 2023-12-15 ENCOUNTER — Ambulatory Visit
Admission: RE | Admit: 2023-12-15 | Discharge: 2023-12-15 | Disposition: A | Discharge status: Home / Self Care | Payer: 59 | Source: Ambulatory Visit | Attending: Podiatry | Admitting: Podiatry

## 2023-12-15 DIAGNOSIS — M722 Plantar fascial fibromatosis: Secondary | ICD-10-CM

## 2023-12-26 ENCOUNTER — Ambulatory Visit: Payer: 59 | Attending: Internal Medicine | Admitting: Internal Medicine

## 2023-12-26 ENCOUNTER — Encounter: Payer: Self-pay | Admitting: Podiatry

## 2023-12-26 VITALS — BP 112/68 | HR 64 | Ht 65.5 in | Wt 182.2 lb

## 2023-12-26 DIAGNOSIS — I2510 Atherosclerotic heart disease of native coronary artery without angina pectoris: Secondary | ICD-10-CM

## 2023-12-26 DIAGNOSIS — I739 Peripheral vascular disease, unspecified: Secondary | ICD-10-CM | POA: Diagnosis not present

## 2023-12-26 DIAGNOSIS — E782 Mixed hyperlipidemia: Secondary | ICD-10-CM

## 2023-12-26 DIAGNOSIS — R072 Precordial pain: Secondary | ICD-10-CM

## 2023-12-26 MED ORDER — CILOSTAZOL 100 MG PO TABS: 100.0000 mg | ORAL_TABLET | Freq: Two times a day (BID) | ORAL | 3 refills | Status: DC

## 2023-12-26 NOTE — Progress Notes (Signed)
 Cardiology Office Note:  .    Date:  12/26/2023  ID:  DEBRAH GRANDERSON, DOB [birth year], MRN [id number] PCP: Delorse Lek, MD  [facility] Providers Cardiologist:  Christell Constant, MD     CC: Angina and claudication   History of Present Illness: .    Angela Knight is a 53 y.o. female with coronary artery disease who presents with muscle pain and claudication.  She has a history of coronary artery disease, type 2 diabetes, hypertension, and hyperlipidemia. She underwent multivessel percutaneous coronary intervention (PCI) and is on dual antiplatelet therapy with aspirin and ticagrelor, planned until May 2025. She is also on Lipitor 80 mg, Cozaar 25 mg, and metoprolol, with nitroglycerin as needed. She started cardiac rehab and was doing well as of August last year. Recently, she experienced a stressful week with an episode of chest pain radiating to her shoulder blade, accompanied by nausea and chills. Her blood pressure was slightly elevated at 140/?Marland Kitchen She had similar symptoms twice since her PCI, with one episode leading to an ER visit where tests showed normal results.  She has been experiencing significant muscle and tendon pain, particularly in her left leg, diagnosed with plantar fasciitis. Despite treatment attempts with stretching and injections, the condition persists, necessitating the use of a boot. An MRI revealed arthritis and plantar fasciitis. Without the boot, she experiences severe pain after standing all day, leading to limping by nighttime. She reports severe, unprovoked muscle cramps in the same leg, from the groin to the knee, occurring twice in the past week. A claudication test indicated moderate blockage in the left leg and mild in the right, with normal blood flow at rest but issues post-exercise.  No rupture of the plantar fascia. She is currently using a boot and has undergone stretching and injections without relief. An MRI has been performed.   Relevant  histories: .  Social - teacher in [employer] ROS: As per HPI.   Studies Reviewed: .   Cardiac Studies & Procedures   ______________________________________________________________________________________________ CARDIAC CATHETERIZATION  CARDIAC CATHETERIZATION 03/17/2023  Narrative   Mid RCA lesion is 90% stenosed.  A drug-eluting stent was successfully placed using a SYNERGY XD 3.50X20, postdilated to greater than 3.75 mm and optimized with intravascular ultrasound.   Post intervention, there is a 0% residual stenosis.   Mid LAD-1 lesion is 90% stenosed.  A drug-eluting stent was successfully placed using a SYNERGY XD 2.75X16, postdilated to greater than 3 mm and optimized with intravascular ultrasound.   Post intervention, there is a 0% residual stenosis.   Mid LAD-2 lesion is 20% stenosed.   2nd Diag lesion is 35% stenosed.  Jailed diagonal maintained TIMI-3 flow.   LV end diastolic pressure is low.   There is no aortic valve stenosis.  Successful two-vessel PCI of the RCA and LAD.  Continue dual antiplatelet therapy for 12 months.  If there are bleeding issues, could stop aspirin early.  Continue aggressive secondary prevention including diabetes control and lipid-lowering therapy.  Mynx closure device was deployed.  Of note, the patient reported severe right arm pain during her diagnostic cath 2 days ago.  I suspect she had severe right radial spasm.  That is why we used the femoral approach.  Results conveyed to her husband, Francee Piccolo.  Findings Coronary Findings Diagnostic  Dominance: Right  Left Anterior Descending Mid LAD-1 lesion is 90% stenosed. Mid LAD-2 lesion is 20% stenosed.  Second Diagonal Branch 2nd Diag lesion is 35% stenosed.  Right Coronary Artery Mid RCA lesion is 90% stenosed. Vessel is the culprit lesion.  Intervention  Mid LAD-1 lesion Stent CATH LAUNCHER 6FR EBU3.5 guide catheter was inserted. Lesion crossed with guidewire using a WIRE RUNTHROUGH  .[id number]. Pre-stent angioplasty was performed using a BALLN WOLVERINE 2.50X10. A drug-eluting stent was successfully placed using a SYNERGY XD 2.75X16. Stent strut is well apposed. Post-stent angioplasty was performed using a BALL SAPPHIRE NC24 3.0X10. Prowater wire to the diagonal that was jailed.  TIMI 3 flow maintained throughout the procedure. Post-Intervention Lesion Assessment The intervention was successful. Pre-interventional TIMI flow is 3. Post-intervention TIMI flow is 3. No complications occurred at this lesion. There is a 0% residual stenosis post intervention.  Mid RCA lesion Stent CATH LAUNCHER 6FR JR4 guide catheter was inserted. Lesion crossed with guidewire using a WIRE RUNTHROUGH .[id number]. Pre-stent angioplasty was performed using a BALLN EMERGE MR 3.0X12. A drug-eluting stent was successfully placed using a SYNERGY XD 3.50X20. Stent strut is well apposed. Post-stent angioplasty was performed using a BALLN [place] EMERGE MR [id number]. Post-Intervention Lesion Assessment The intervention was successful. Pre-interventional TIMI flow is 3. Post-intervention TIMI flow is 3. No complications occurred at this lesion. Ultrasound (IVUS) was performed on the lesion post PCI using a CATH OPTICROSS HD. Stent well apposed. No plaque burden detected. There is a 0% residual stenosis post intervention.   CARDIAC CATHETERIZATION 03/15/2023  Narrative   Mid LAD-1 lesion is 90% stenosed.   2nd Diag lesion is 35% stenosed.   Prox RCA lesion is 95% stenosed.   Mid LAD-2 lesion is 20% stenosed.   Non-stenotic Prox RCA to Mid RCA lesion.   The left ventricular systolic function is normal.   LV end diastolic pressure is mildly elevated.   The left ventricular ejection fraction is 55-65% by visual estimate.  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  High-grade 95% mid RCA stenosis followed by  aneurysmal dilatation of the RCA in a dominant right coronary artery.  Normal left circumflex coronary artery.  Normal global LV function with EF estimate approximately 50 - 55%.  LVEDP mildly elevated at 21 mmHg.  RECOMMENDATION: Consider surgical consultation in light of complex large LAD/diagonal bifurcation stenosis and high-grade large dominant RCA.  Will have colleagues review.  Will reinitiate heparin several hours post TR band removal.  Findings Coronary Findings Diagnostic  Dominance: Right  Left Anterior Descending Mid LAD-1 lesion is 90% stenosed. Mid LAD-2 lesion is 20% stenosed.  Second Diagonal Branch 2nd Diag lesion is 35% stenosed.  Right Coronary Artery Prox RCA lesion is 95% stenosed. Non-stenotic Prox RCA to Mid RCA lesion.  Intervention  No interventions have been documented.     ECHOCARDIOGRAM  ECHOCARDIOGRAM COMPLETE 03/15/2023  Narrative ECHOCARDIOGRAM REPORT    Patient Name:   ANALEYA LUALLEN Date of Exam: 03/15/2023 Medical Rec #:  [id number]     Height:       66.0 in Accession #:    [id number]    Weight:       205.7 lb Date of Birth:  09-Oct-1971     BSA:          2.024 m Patient Age:    51 years      BP:           130/79 mmHg Patient Gender: F             HR:  59 bpm. Exam Location:  Inpatient  Procedure: 2D Echo, Color Doppler and Cardiac Doppler  Indications:    NSTEMI  History:        Patient has no prior history of Echocardiogram examinations. Risk Factors:Hypertension, Diabetes and Dyslipidemia.  Sonographer:    Irving Burton Senior RDCS Referring Phys: [id number] TIMOTHY S OPYD  IMPRESSIONS   1. Left ventricular ejection fraction, by estimation, is 65 to 70%. The left ventricle has normal function. The left ventricle has no regional wall motion abnormalities. There is mild concentric left ventricular hypertrophy. Left ventricular diastolic parameters were normal. 2. Right ventricular systolic function is normal. The right  ventricular size is mildly enlarged. Tricuspid regurgitation signal is inadequate for assessing PA pressure. 3. The mitral valve is normal in structure. Trivial mitral valve regurgitation. 4. The aortic valve is tricuspid. Aortic valve regurgitation is not visualized. 5. The inferior vena cava is normal in size with greater than 50% respiratory variability, suggesting right atrial pressure of 3 mmHg.  FINDINGS Left Ventricle: Left ventricular ejection fraction, by estimation, is 65 to 70%. The left ventricle has normal function. The left ventricle has no regional wall motion abnormalities. The left ventricular internal cavity size was normal in size. There is mild concentric left ventricular hypertrophy. Left ventricular diastolic parameters were normal.  Right Ventricle: The right ventricular size is mildly enlarged. No increase in right ventricular wall thickness. Right ventricular systolic function is normal. Tricuspid regurgitation signal is inadequate for assessing PA pressure.  Left Atrium: Left atrial size was normal in size.  Right Atrium: Right atrial size was normal in size.  Pericardium: Trivial pericardial effusion is present.  Mitral Valve: The mitral valve is normal in structure. Trivial mitral valve regurgitation.  Tricuspid Valve: The tricuspid valve is normal in structure. Tricuspid valve regurgitation is trivial.  Aortic Valve: The aortic valve is tricuspid. Aortic valve regurgitation is not visualized.  Pulmonic Valve: The pulmonic valve was normal in structure. Pulmonic valve regurgitation is trivial.  Aorta: The aortic root and ascending aorta are structurally normal, with no evidence of dilitation.  Venous: The inferior vena cava is normal in size with greater than 50% respiratory variability, suggesting right atrial pressure of 3 mmHg.  IAS/Shunts: No atrial level shunt detected by color flow Doppler.   LEFT VENTRICLE PLAX 2D LVIDd:         4.80 cm    Diastology LVIDs:         2.60 cm   LV e' medial:    9.95 cm/s LV PW:         1.20 cm   LV E/e' medial:  8.0 LV IVS:        1.00 cm   LV e' lateral:   14.00 cm/s LVOT diam:     1.70 cm   LV E/e' lateral: 5.7 LV SV:         47 LV SV Index:   23 LVOT Area:     2.27 cm   RIGHT VENTRICLE RV S prime:     8.39 cm/s TAPSE (M-mode): 2.2 cm  LEFT ATRIUM             Index        RIGHT ATRIUM           Index LA diam:        2.20 cm 1.09 cm/m   RA Area:     19.30 cm LA Vol (A2C):   36.9 ml 18.23 ml/m  RA Volume:   54.70  ml  27.03 ml/m LA Vol (A4C):   35.2 ml 17.39 ml/m LA Biplane Vol: 39.2 ml 19.37 ml/m AORTIC VALVE LVOT Vmax:   99.40 cm/s LVOT Vmean:  69.400 cm/s LVOT VTI:    0.205 m  AORTA Ao Root diam: 3.10 cm Ao Asc diam:  2.80 cm  MITRAL VALVE MV Area (PHT): 2.96 cm    SHUNTS MV Decel Time: 256 msec    Systemic VTI:  0.20 m MV E velocity: 79.20 cm/s  Systemic Diam: 1.70 cm MV A velocity: 69.70 cm/s MV E/A ratio:  1.14  Donato Schultz MD Electronically signed by Donato Schultz MD Signature Date/Time: 03/15/2023/11:04:05 AM    Final          ______________________________________________________________________________________________       Physical Exam:    VS:  BP 112/68 (BP Location: Right Arm)   Pulse 64   Ht 5' 5.5" (1.664 m)   Wt 182 lb 3.2 oz (82.6 kg)   LMP 04/27/2017 (Approximate)   SpO2 98%   BMI 29.86 kg/m    Wt Readings from Last 3 Encounters:  12/26/23 182 lb 3.2 oz (82.6 kg)  06/07/23 187 lb 6.3 oz (85 kg)  05/29/23 190 lb (86.2 kg)    Gen: no distress   Neck: No JVD Cardiac: No Rubs or Gallops, no Murmur, RRR +2 radial pulses Respiratory: Clear to auscultation bilaterally, normal effort, normal  respiratory rate GI: Soft, nontender, non-distended  MS: No  edema;  moves all extremities Integument: Skin feels warm Neuro:  At time of evaluation, alert and oriented to person/place/time/situation  Psych: Normal affect, patient feels  warm  ASSESSMENT AND PLAN: .    Coronary Artery Disease with Angina She underwent multivessel PCI but continues to experience angina. Blood pressure and cholesterol are well-controlled. She is on DAPT with aspirin and ticagrelor. The plan to stop ticagrelor in May 2025 is on hold pending evaluation for peripheral arterial disease. Discussed the possibility of avoiding future bypass surgery due to the focal nature of her lesions, but it remains an option if needed. - Continue aspirin indefinitely - Continue ticagrelor until further evaluation - Continue Lipitor 80 mg - Continue Cozaar 25 mg - Continue metoprolol - Prescribe nitroglycerin as needed - Order exercise nuclear medicine stress test - Follow up with Rafael Bihari in 3 months  Peripheral Arterial Disease She experiences muscle cramps and claudication, with moderate blockage in the left leg and mild blockage in the right leg. A claudication test was positive. Scheduled to see a vascular surgeon for further evaluation. If peripheral arterial disease is not confirmed, a trial off statin medication will be considered. Discussed potential intervention by a vascular surgeon if claudication is related to peripheral arterial disease. - Start cilostazol 100 mg PO BID - Refer to vascular surgeon Lemar Livings - Consider trial off statin medication if peripheral arterial disease is not confirmed    Riley Lam, MD FASE [facility] Cardiologist [facility]  [address] [place], [place] [address] [phone]  5:20 PM

## 2023-12-26 NOTE — Patient Instructions (Addendum)
 Medication Instructions:  Your physician has recommended you make the following change in your medication:  STOP: Brilinta (ticagrelor)  START: cilostazol (Pletal) 100 mg by mouth twice daily  *If you need a refill on your cardiac medications before your next appointment, please call your pharmacy*   Lab Work: NONE If you have labs (blood work) drawn today and your tests are completely normal, you will receive your results only by: MyChart Message (if you have MyChart) OR A paper copy in the mail If you have any lab test that is abnormal or we need to change your treatment, we will call you to review the results.   Testing/Procedures: Your physician has requested that you have en exercise stress myoview. For further information please visit [email or web address] Please follow instruction sheet, as given.    You are scheduled for a Myocardial Perfusion Imaging Study. Please arrive 15 minutes prior to your appointment time for registration and insurance purposes.   The test will take approximately 3 to 4 hours to complete; you may bring reading material.  If someone comes with you to your appointment, they will need to remain in the main lobby due to limited space in the testing area.     How to prepare for your Myocardial Perfusion Test: Do not eat or drink 3 hours prior to your test, except you may have water. Do not consume products containing caffeine (regular or decaffeinated) 12 hours prior to your test. (ex: coffee, chocolate, sodas, tea). Do bring a list of your current medications with you.  If not listed below, you may take your medications as normal. Do wear comfortable clothes (no dresses or overalls) and walking shoes, tennis shoes preferred (No heels or open toe shoes are allowed). Do NOT wear cologne, perfume, aftershave, or lotions (deodorant is allowed). If these instructions are not followed, your test will have to be rescheduled.  If you cannot keep your  appointment, please provide 24 hours notification to the Nuclear Lab, to avoid a possible $50 charge to your account.       Follow-Up: At [facility], you and your health needs are our priority.  As part of our continuing mission to provide you with exceptional heart care, we have created designated Provider Care Teams.  These Care Teams include your primary Cardiologist (physician) and Advanced Practice Providers (APPs -  Physician Assistants and Nurse Practitioners) who all work together to provide you with the care you need, when you need it.  We recommend signing up for the patient portal called "MyChart".  Sign up information is provided on this After Visit Summary.  MyChart is used to connect with patients for Virtual Visits (Telemedicine).  Patients are able to view lab/test results, encounter notes, upcoming appointments, etc.  Non-urgent messages can be sent to your provider as well.   To learn more about what you can do with MyChart, go to [email or web address].    Your next appointment:   3 month(s)  Provider:   Eligha Bridegroom, NP

## 2023-12-28 ENCOUNTER — Other Ambulatory Visit: Payer: Self-pay | Admitting: Podiatry

## 2023-12-28 DIAGNOSIS — M722 Plantar fascial fibromatosis: Secondary | ICD-10-CM

## 2023-12-30 ENCOUNTER — Other Ambulatory Visit: Payer: Self-pay | Admitting: Podiatry

## 2024-01-01 ENCOUNTER — Other Ambulatory Visit: Payer: Self-pay

## 2024-01-01 ENCOUNTER — Encounter (HOSPITAL_COMMUNITY): Payer: Self-pay | Admitting: Emergency Medicine

## 2024-01-01 ENCOUNTER — Emergency Department (HOSPITAL_COMMUNITY)

## 2024-01-01 ENCOUNTER — Encounter (HOSPITAL_COMMUNITY)
Admission: EM | Disposition: A | Discharge status: Home / Self Care | Payer: Self-pay | Source: Home / Self Care | Attending: Emergency Medicine

## 2024-01-01 ENCOUNTER — Observation Stay (HOSPITAL_COMMUNITY)
Admission: EM | Admit: 2024-01-01 | Discharge: 2024-01-02 | Disposition: A | Discharge status: Home / Self Care | Attending: Internal Medicine | Admitting: Internal Medicine

## 2024-01-01 DIAGNOSIS — Z7984 Long term (current) use of oral hypoglycemic drugs: Secondary | ICD-10-CM | POA: Insufficient documentation

## 2024-01-01 DIAGNOSIS — Z9861 Coronary angioplasty status: Secondary | ICD-10-CM

## 2024-01-01 DIAGNOSIS — K219 Gastro-esophageal reflux disease without esophagitis: Secondary | ICD-10-CM | POA: Diagnosis not present

## 2024-01-01 DIAGNOSIS — E119 Type 2 diabetes mellitus without complications: Secondary | ICD-10-CM | POA: Diagnosis not present

## 2024-01-01 DIAGNOSIS — I25110 Atherosclerotic heart disease of native coronary artery with unstable angina pectoris: Secondary | ICD-10-CM | POA: Diagnosis not present

## 2024-01-01 DIAGNOSIS — M542 Cervicalgia: Secondary | ICD-10-CM

## 2024-01-01 DIAGNOSIS — Z955 Presence of coronary angioplasty implant and graft: Secondary | ICD-10-CM | POA: Diagnosis not present

## 2024-01-01 DIAGNOSIS — I2510 Atherosclerotic heart disease of native coronary artery without angina pectoris: Secondary | ICD-10-CM

## 2024-01-01 DIAGNOSIS — I739 Peripheral vascular disease, unspecified: Secondary | ICD-10-CM | POA: Diagnosis not present

## 2024-01-01 DIAGNOSIS — R079 Chest pain, unspecified: Principal | ICD-10-CM | POA: Diagnosis present

## 2024-01-01 DIAGNOSIS — E785 Hyperlipidemia, unspecified: Secondary | ICD-10-CM | POA: Insufficient documentation

## 2024-01-01 DIAGNOSIS — I10 Essential (primary) hypertension: Secondary | ICD-10-CM | POA: Insufficient documentation

## 2024-01-01 DIAGNOSIS — I25118 Atherosclerotic heart disease of native coronary artery with other forms of angina pectoris: Secondary | ICD-10-CM | POA: Diagnosis not present

## 2024-01-01 DIAGNOSIS — R072 Precordial pain: Principal | ICD-10-CM

## 2024-01-01 DIAGNOSIS — M79605 Pain in left leg: Secondary | ICD-10-CM | POA: Diagnosis not present

## 2024-01-01 DIAGNOSIS — Z7982 Long term (current) use of aspirin: Secondary | ICD-10-CM | POA: Diagnosis not present

## 2024-01-01 HISTORY — PX: LEFT HEART CATH AND CORONARY ANGIOGRAPHY: CATH118249

## 2024-01-01 LAB — CBC
HCT: 41.7 % (ref 36.0–46.0)
HCT: 39.7 % (ref 36.0–46.0)
Hemoglobin: 14.1 g/dL (ref 12.0–15.0)
Hemoglobin: 14.1 g/dL (ref 12.0–15.0)
MCH: 30 pg (ref 26.0–34.0)
MCH: 30.2 pg (ref 26.0–34.0)
MCHC: 33.8 g/dL (ref 30.0–36.0)
MCHC: 35.5 g/dL (ref 30.0–36.0)
MCV: 88.7 fL (ref 80.0–100.0)
MCV: 85 fL (ref 80.0–100.0)
Platelets: 320 10*3/uL (ref 150–400)
Platelets: 298 10*3/uL (ref 150–400)
RBC: 4.7 MIL/uL (ref 3.87–5.11)
RBC: 4.67 MIL/uL (ref 3.87–5.11)
RDW: 13.5 % (ref 11.5–15.5)
RDW: 13.4 % (ref 11.5–15.5)
WBC: 8.8 10*3/uL (ref 4.0–10.5)
WBC: 7.7 10*3/uL (ref 4.0–10.5)
nRBC: 0 % (ref 0.0–0.2)
nRBC: 0 % (ref 0.0–0.2)

## 2024-01-01 LAB — BASIC METABOLIC PANEL
Anion gap: 15 (ref 5–15)
BUN: 10 mg/dL (ref 6–20)
CO2: 19 mmol/L — ABNORMAL LOW (ref 22–32)
Calcium: 11 mg/dL — ABNORMAL HIGH (ref 8.9–10.3)
Chloride: 106 mmol/L (ref 98–111)
Creatinine, Ser: 0.8 mg/dL (ref 0.44–1.00)
GFR, Estimated: >60 mL/min (ref >60)
Glucose, Bld: 135 mg/dL — ABNORMAL HIGH (ref 70–99)
Potassium: 3.4 mmol/L — ABNORMAL LOW (ref 3.5–5.1)
Sodium: 140 mmol/L (ref 135–145)

## 2024-01-01 LAB — LIPID PANEL
Cholesterol: 119 mg/dL (ref 0–200)
HDL: 57 mg/dL (ref >40)
LDL Cholesterol: 44 mg/dL (ref 0–99)
Total CHOL/HDL Ratio: 2.1 ratio
Triglycerides: 92 mg/dL (ref <150)
VLDL: 18 mg/dL (ref 0–40)

## 2024-01-01 LAB — TROPONIN I (HIGH SENSITIVITY)
Troponin I (High Sensitivity): 4 ng/L (ref <18)
Troponin I (High Sensitivity): 4 ng/L (ref <18)

## 2024-01-01 LAB — HEMOGLOBIN A1C
Hgb A1c MFr Bld: 6.4 % — ABNORMAL HIGH (ref 4.8–5.6)
Mean Plasma Glucose: 136.98 mg/dL

## 2024-01-01 LAB — GLUCOSE, CAPILLARY
Glucose-Capillary: 85 mg/dL (ref 70–99)
Glucose-Capillary: 91 mg/dL (ref 70–99)

## 2024-01-01 SURGERY — LEFT HEART CATH AND CORONARY ANGIOGRAPHY
Anesthesia: LOCAL

## 2024-01-01 MED ORDER — TICAGRELOR 90 MG PO TABS
90.0000 mg | ORAL_TABLET | Freq: Two times a day (BID) | ORAL | Status: DC
  Administered 2024-01-01 – 2024-01-02 (×2): 90 mg via ORAL
  Filled 2024-01-01 (×2): qty 1

## 2024-01-01 MED ORDER — LIDOCAINE HCL (PF) 1 % IJ SOLN
INTRAMUSCULAR | Status: DC | PRN
  Administered 2024-01-01: 10 mL

## 2024-01-01 MED ORDER — SODIUM CHLORIDE 0.9 % IV SOLN: 250.0000 mL | INTRAVENOUS | Status: DC | PRN

## 2024-01-01 MED ORDER — ENOXAPARIN SODIUM 40 MG/0.4ML IJ SOSY
40.0000 mg | PREFILLED_SYRINGE | INTRAMUSCULAR | Status: DC
  Administered 2024-01-01: 40 mg via SUBCUTANEOUS
  Filled 2024-01-01: qty 0.4

## 2024-01-01 MED ORDER — ALBUTEROL SULFATE (2.5 MG/3ML) 0.083% IN NEBU: 2.5000 mg | INHALATION_SOLUTION | Freq: Four times a day (QID) | RESPIRATORY_TRACT | Status: DC | PRN

## 2024-01-01 MED ORDER — LOSARTAN POTASSIUM 25 MG PO TABS
12.5000 mg | ORAL_TABLET | Freq: Every day | ORAL | Status: DC
  Filled 2024-01-01: qty 0.5

## 2024-01-01 MED ORDER — SODIUM CHLORIDE 0.9% FLUSH: 3.0000 mL | INTRAVENOUS | Status: DC | PRN

## 2024-01-01 MED ORDER — ACETAMINOPHEN 325 MG PO TABS
650.0000 mg | ORAL_TABLET | Freq: Four times a day (QID) | ORAL | Status: DC | PRN
  Administered 2024-01-02: 650 mg via ORAL
  Filled 2024-01-01: qty 2

## 2024-01-01 MED ORDER — SODIUM CHLORIDE 0.9 % WEIGHT BASED INFUSION
1.0000 mL/kg/h | INTRAVENOUS | Status: DC
  Administered 2024-01-01: 1 mL/kg/h via INTRAVENOUS

## 2024-01-01 MED ORDER — NITROGLYCERIN 0.4 MG SL SUBL: 0.4000 mg | SUBLINGUAL_TABLET | SUBLINGUAL | Status: DC | PRN

## 2024-01-01 MED ORDER — POTASSIUM CHLORIDE CRYS ER 20 MEQ PO TBCR
40.0000 meq | EXTENDED_RELEASE_TABLET | Freq: Once | ORAL | Status: AC
  Administered 2024-01-01: 40 meq via ORAL
  Filled 2024-01-01: qty 2

## 2024-01-01 MED ORDER — LIDOCAINE HCL (PF) 1 % IJ SOLN
INTRAMUSCULAR | Status: AC
  Filled 2024-01-01: qty 30

## 2024-01-01 MED ORDER — ATORVASTATIN CALCIUM 80 MG PO TABS
80.0000 mg | ORAL_TABLET | Freq: Every day | ORAL | Status: DC
  Administered 2024-01-01: 80 mg via ORAL
  Filled 2024-01-01 (×2): qty 1

## 2024-01-01 MED ORDER — LOSARTAN POTASSIUM 25 MG PO TABS: 12.5000 mg | ORAL_TABLET | Freq: Every day | ORAL | Status: DC

## 2024-01-01 MED ORDER — ASPIRIN 81 MG PO CHEW: 81.0000 mg | CHEWABLE_TABLET | ORAL | Status: DC

## 2024-01-01 MED ORDER — SODIUM CHLORIDE 0.9% FLUSH
3.0000 mL | Freq: Two times a day (BID) | INTRAVENOUS | Status: DC
  Administered 2024-01-01: 3 mL via INTRAVENOUS

## 2024-01-01 MED ORDER — LABETALOL HCL 5 MG/ML IV SOLN: 10.0000 mg | INTRAVENOUS | Status: AC | PRN

## 2024-01-01 MED ORDER — SODIUM CHLORIDE 0.9 % WEIGHT BASED INFUSION
3.0000 mL/kg/h | INTRAVENOUS | Status: AC
  Administered 2024-01-01: 3 mL/kg/h via INTRAVENOUS

## 2024-01-01 MED ORDER — PANTOPRAZOLE SODIUM 40 MG PO TBEC
40.0000 mg | DELAYED_RELEASE_TABLET | Freq: Every day | ORAL | Status: DC
  Administered 2024-01-02: 40 mg via ORAL
  Filled 2024-01-01 (×2): qty 1

## 2024-01-01 MED ORDER — ONDANSETRON HCL 4 MG/2ML IJ SOLN: 4.0000 mg | Freq: Four times a day (QID) | INTRAMUSCULAR | Status: DC | PRN

## 2024-01-01 MED ORDER — SODIUM CHLORIDE 0.9 % IV SOLN: INTRAVENOUS | Status: AC

## 2024-01-01 MED ORDER — IOHEXOL 350 MG/ML SOLN
INTRAVENOUS | Status: DC | PRN
  Administered 2024-01-01: 37 mL via INTRA_ARTERIAL

## 2024-01-01 MED ORDER — HEPARIN (PORCINE) IN NACL 1000-0.9 UT/500ML-% IV SOLN
INTRAVENOUS | Status: DC | PRN
  Administered 2024-01-01 (×2): 500 mL via INTRA_ARTERIAL

## 2024-01-01 MED ORDER — NITROGLYCERIN 2 % TD OINT
1.0000 [in_us] | TOPICAL_OINTMENT | Freq: Once | TRANSDERMAL | Status: AC
  Administered 2024-01-01: 1 [in_us] via TOPICAL
  Filled 2024-01-01: qty 1

## 2024-01-01 MED ORDER — FENTANYL CITRATE PF 50 MCG/ML IJ SOSY
12.5000 ug | PREFILLED_SYRINGE | Freq: Once | INTRAMUSCULAR | Status: AC
  Administered 2024-01-01: 12.5 ug via INTRAVENOUS
  Filled 2024-01-01: qty 1

## 2024-01-01 MED ORDER — HYDRALAZINE HCL 20 MG/ML IJ SOLN
10.0000 mg | INTRAMUSCULAR | Status: AC | PRN
Start: 2024-01-01 — End: 2024-01-02

## 2024-01-01 MED ORDER — GABAPENTIN 300 MG PO CAPS
300.0000 mg | ORAL_CAPSULE | Freq: Every day | ORAL | Status: DC
  Administered 2024-01-01: 300 mg via ORAL
  Filled 2024-01-01: qty 1

## 2024-01-01 MED ORDER — AMLODIPINE BESYLATE 5 MG PO TABS
2.5000 mg | ORAL_TABLET | Freq: Every day | ORAL | Status: DC
  Administered 2024-01-02: 2.5 mg via ORAL
  Filled 2024-01-01: qty 1

## 2024-01-01 MED ORDER — ACETAMINOPHEN 650 MG RE SUPP: 650.0000 mg | Freq: Four times a day (QID) | RECTAL | Status: DC | PRN

## 2024-01-01 MED ORDER — METOPROLOL TARTRATE 12.5 MG HALF TABLET
12.5000 mg | ORAL_TABLET | Freq: Two times a day (BID) | ORAL | Status: DC
  Administered 2024-01-01 – 2024-01-02 (×2): 12.5 mg via ORAL
  Filled 2024-01-01 (×3): qty 1

## 2024-01-01 MED ORDER — SODIUM CHLORIDE 0.9% FLUSH
3.0000 mL | Freq: Two times a day (BID) | INTRAVENOUS | Status: DC
  Administered 2024-01-02: 3 mL via INTRAVENOUS

## 2024-01-01 MED ORDER — ACETAMINOPHEN 500 MG PO TABS
1000.0000 mg | ORAL_TABLET | Freq: Once | ORAL | Status: AC
  Administered 2024-01-01: 1000 mg via ORAL
  Filled 2024-01-01: qty 2

## 2024-01-01 MED ORDER — ASPIRIN 81 MG PO TBEC
81.0000 mg | DELAYED_RELEASE_TABLET | Freq: Every day | ORAL | Status: DC
  Administered 2024-01-02: 81 mg via ORAL
  Filled 2024-01-01 (×2): qty 1

## 2024-01-01 MED ORDER — ASPIRIN 81 MG PO CHEW
81.0000 mg | CHEWABLE_TABLET | ORAL | Status: AC
  Administered 2024-01-01: 81 mg via ORAL
  Filled 2024-01-01: qty 1

## 2024-01-01 MED ORDER — ONDANSETRON HCL 4 MG PO TABS: 4.0000 mg | ORAL_TABLET | Freq: Four times a day (QID) | ORAL | Status: DC | PRN

## 2024-01-01 SURGICAL SUPPLY — 8 items
CATH INFINITI 5FR MULTPACK ANG (CATHETERS) IMPLANT
ELECT DEFIB PAD ADLT CADENCE (PAD) IMPLANT
KIT SINGLE USE MANIFOLD (KITS) IMPLANT
PACK CARDIAC CATHETERIZATION (CUSTOM PROCEDURE TRAY) ×1 IMPLANT
SET ATX-X65L (MISCELLANEOUS) IMPLANT
SHEATH PROBE COVER 6X72 (BAG) IMPLANT
WIRE EMERALD 3MM-J .035X150CM (WIRE) IMPLANT
WIRE MICRO SET SILHO 5FR 7 (SHEATH) IMPLANT

## 2024-01-01 NOTE — Progress Notes (Signed)
 SITE AREA: right groin/femoral  SITE PRIOR TO REMOVAL:  LEVEL 0  PRESSURE APPLIED FOR: approximately 25 minutes  MANUAL: yes  PATIENT STATUS DURING PULL: stable, pain with sheath removal, decreased when manual pressure done  POST PULL SITE:  LEVEL 0  POST PULL INSTRUCTIONS GIVEN: yes, drsg x 24 hours, may shower tomorrow, no sitting in water x 1 week, no hot tubs, bath tubs, or pools, if you feel anything wet/moist or see bleeding please let staff now, if at home and bleeding occurs please call 911, climb easy on stairs and into and out of vehicles  POST PULL PULSES PRESENT: bilateral pedal pulses at +2  DRESSING APPLIED:  gauze with tegaderm  BEDREST BEGINS @ 1724  COMMENTS:

## 2024-01-01 NOTE — H&P (View-Only) (Signed)
 Cardiology Consultation   Patient ID: Angela Knight MRN: [id number]; DOB: February 13, 1971  Admit date: 01/01/2024 Date of Consult: 01/01/2024  PCP:  Delorse Lek, MD   [facility] Providers Cardiologist:  Christell Constant, MD      Patient Profile:   Angela Knight is a 53 y.o. female with a hx of CAD, hypertension, and DM2 who is being seen 01/01/2024 for the evaluation of chest pain at the request of Dr. Durwin Nora.  History of Present Illness:   Angela Knight presented with chest pain and elevated troponin 03/14/23.  Echocardiogram showed normal EF, mild MR, normal RV function, and no significant valvular disease. She underwent LHC 03/15/23 that showed high grade 95% mid RCA stenosis followed by aneurysmal dilatation of the RCA as well as 90% mid LAD stenosis at the D2 bifurcation. Case discussed with interventional team and decision was made to proceed with staged intervention:  DES-RCA with 3.5 x 20 mm and DES-mLAD 2.75 x 16 mm. Due to vasospasm, cath ultimately through groin. She tolerated the procedure and discharged on DAPT. 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 She started trying to walk again, but developed left leg pain and issues with left plantar fasciitis, which she has seen podiatry. She started school again but has developed recurrence of chest pain. She was seen by Dr. Izora Ribas on 12/26/23 with intermittent chest pain that typically began as neck pain and radiated to her left chest and back. She reports one episode of nausea with CP and one episode of diaphoresis with a different chest pain. She was scheduled for a nuclear stress test to evaluate CP. She also discussed left leg pain and underwent ABIs which showed normal resting ABIs bilaterally, but decreased post exercise ABIs with mild disease on right and moderate disease on the left. Given leg pain, she was taken off brilinta and started on pletal at that visit (12/26/23). Since starting  pletal, she has had worsening dizziness, headache, and generally feeling unwell. She has continued to have chest pain with rest (she is not ambulating a lot due to left leg pain) that was rated as a 5-6/10 and lasted 30 min to 2 hrs. Last night, severe 9/10 neck pain woke her from sleep. The neck pain quickly radiated to her chest. She took NTG x 1 and then proceeded to the ER. She took another NTG x 1 in route to the ER. This seemed to mildly decrease her chest pain.   On arrival, she was given a NTG patch which nearly eliminated her chest pain. NTG patch removed this morning due to headache and she now has CP 3-4/10.   Troponin x 2 negative EKG NSR HR 76 sCr 0.8 K 3.4    Past Medical History:  Diagnosis Date   Car sickness    Complication of anesthesia    severe   Family history of adverse reaction to anesthesia    PONV   GERD (gastroesophageal reflux disease)    occ. OTC med used   History of esophageal stricture    stenosis dilatation 05-02-2016   History of gastric polyp    benign 05-02-2016   PONV (postoperative nausea and vomiting)    SVD (spontaneous vaginal delivery)    x 6   Tubulovillous adenoma of rectum    multiple recurrent's   Wears glasses     Past Surgical History:  Procedure Laterality Date   ANTERIOR AND POSTERIOR REPAIR N/A 05/08/2017  Procedure: ANTERIOR Colporrhaphy (CYSTOCELE) AND POSTERIOR REPAIR (RECTOCELE), PERINEALORRHAPHY;  Surgeon: Olivia Mackie, MD;  Location: [facility];  Service: Gynecology;  Laterality: N/A;   COLONOSCOPY WITH PROPOFOL N/A 05/02/2016   Procedure: COLONOSCOPY WITH PROPOFOL;  Surgeon: Charolett Bumpers, MD;  Location: [facility];  Service: Endoscopy;  Laterality: N/A;   CORONARY STENT INTERVENTION N/A 03/17/2023   Procedure: CORONARY STENT INTERVENTION;  Surgeon: Corky Crafts, MD;  Location: [facility] INVASIVE CV LAB;  Service: Cardiovascular;  Laterality: N/A;   CORONARY ULTRASOUND/IVUS N/A 03/17/2023   Procedure: Coronary  Ultrasound/IVUS;  Surgeon: Corky Crafts, MD;  Location: [facility] INVASIVE CV LAB;  Service: Cardiovascular;  Laterality: N/A;   ESOPHAGOGASTRODUODENOSCOPY (EGD) WITH PROPOFOL N/A 05/02/2016   Procedure: ESOPHAGOGASTRODUODENOSCOPY (EGD) WITH PROPOFOL;  Surgeon: Charolett Bumpers, MD;  Location: [facility];  Service: Endoscopy;  Laterality: N/A;   LEFT HEART CATH AND CORONARY ANGIOGRAPHY N/A 03/15/2023   Procedure: LEFT HEART CATH AND CORONARY ANGIOGRAPHY;  Surgeon: Lennette Bihari, MD;  Location: [facility] INVASIVE CV LAB;  Service: Cardiovascular;  Laterality: N/A;   LEFT HEART CATH AND CORONARY ANGIOGRAPHY N/A 03/17/2023   Procedure: LEFT HEART CATH AND CORONARY ANGIOGRAPHY;  Surgeon: Corky Crafts, MD;  Location: [facility] INVASIVE CV LAB;  Service: Cardiovascular;  Laterality: N/A;   ROBOTIC ASSISTED TOTAL HYSTERECTOMY WITH SALPINGECTOMY Bilateral 05/08/2017   Procedure: ROBOTIC ASSISTED TOTAL HYSTERECTOMY WITH SALPINGECTOMY And Uterosacral Ligament Suspension;  Surgeon: Olivia Mackie, MD;  Location: [facility];  Service: Gynecology;  Laterality: Bilateral;   SIGMOIDOSCOPY  multiple--   TRANSANAL EXCISION OF RECTAL MASS  06-12-2003 and 05-24-2004   tubulovillious adenoma   TUMOR EXCISION N/A 07/05/2016   Procedure: TRANSANAL EXCISION OF TUBULLOVILLOUS ADENOMA OF RECTUM;  Surgeon: Avel Peace, MD;  Location: [facility];  Service: General;  Laterality: N/A;     Home Medications:  Prior to Admission medications   Medication Sig Start Date End Date Taking? Authorizing Provider  aspirin EC 81 MG tablet Take 1 tablet (81 mg total) by mouth daily. Swallow whole. 03/22/23   Swinyer, Zachary George, NP  atorvastatin (LIPITOR) 80 MG tablet Take 1 tablet (80 mg total) by mouth at bedtime. 04/17/23   Christell Constant, MD  cilostazol (PLETAL) 100 MG tablet Take 1 tablet (100 mg total) by mouth 2 (two) times daily. 12/26/23   Christell Constant, MD  famotidine-calcium carbonate-magnesium  hydroxide (PEPCID COMPLETE) 10-800-165 MG chewable tablet Chew 1 tablet by mouth as needed (heartburn).    [provider]  gabapentin (NEURONTIN) 300 MG capsule Take 1 capsule (300 mg total) by mouth at bedtime. 11/27/23   Vivi Barrack, DPM  losartan (COZAAR) 25 MG tablet Take 0.5 tablets (12.5 mg total) by mouth daily. 04/17/23   Chandrasekhar, Lafayette Dragon A, MD  metFORMIN (GLUCOPHAGE-XR) 500 MG 24 hr tablet Take 500 mg by mouth 2 (two) times daily.    [provider]  metoprolol tartrate (LOPRESSOR) 25 MG tablet Take 0.5 tablets (12.5 mg total) by mouth 2 (two) times daily. 04/17/23   Chandrasekhar, Rondel Jumbo, MD  nitroGLYCERIN (NITROSTAT) 0.4 MG SL tablet Place 1 tablet (0.4 mg total) under the tongue every 5 (five) minutes x 3 doses as needed for chest pain. 03/18/23   Pieter Partridge, MD  OMEPRAZOLE PO Take 1 tablet by mouth daily. Pt unsure of strength    [provider]    Inpatient Medications: Scheduled Meds:  Continuous Infusions:  PRN Meds:   Allergies:    Allergies  Allergen Reactions  Codeine Nausea And Vomiting   Coffee Bean Extract [Coffea Arabica] Other (See Comments)    Religious dietary preference: no coffee, chocolate OK   Other     All narcotics causes extreme vomitting   Lisinopril Cough   Pseudoephedrine Palpitations   Surgical Lubricant Itching and Rash    Surgical glue    Social History:   Social History   Socioeconomic History   Marital status: Married    Spouse name: Not on file   Number of children: Not on file   Years of education: Not on file   Highest education level: Not on file  Occupational History   Not on file  Tobacco Use   Smoking status: Never   Smokeless tobacco: Never  Vaping Use   Vaping status: Never Used  Substance and Sexual Activity   Alcohol use: No   Drug use: No   Sexual activity: Yes    Birth control/protection: Condom  Other Topics Concern   Not on file  Social History Narrative    Not on file   Social Drivers of Health   Financial Resource Strain: Not on file  Food Insecurity: Low Risk  (08/14/2023)   Received from [facility]   Hunger Vital Sign    Worried About Running Out of Food in the Last Year: Never true    Ran Out of Food in the Last Year: Never true  Transportation Needs: No Transportation Needs (08/14/2023)   Received from [facility]    In the past 12 months, has lack of reliable transportation kept you from medical appointments, meetings, work or from getting things needed for daily living? : No  Physical Activity: Not on file  Stress: Not on file  Social Connections: Not on file  Intimate Partner Violence: Not At Risk (03/15/2023)   Humiliation, Afraid, Rape, and Kick questionnaire    Fear of Current or Ex-Partner: No    Emotionally Abused: No    Physically Abused: No    Sexually Abused: No    Family History:    Family History  Problem Relation Age of Onset   Diabetes Mother    Cancer Mother    High blood pressure Mother    Cancer Father    Diabetes Sister      ROS:  Please see the history of present illness.   All other ROS reviewed and negative.     Physical Exam/Data:   Vitals:   01/01/24 0738 01/01/24 0830 01/01/24 0845 01/01/24 0900  BP: 129/74 130/70 134/80 126/64  Pulse: 72 64 65 68  Resp: 16 16 15 16   Temp: 98.5 F (36.9 C)     TempSrc:      SpO2: 100% 100% 100% 99%  Weight:  81.6 kg    Height:  5' 5.5" (1.664 m)     No intake or output data in the 24 hours ending 01/01/24 1200    01/01/2024    8:30 AM 01/01/2024    1:27 AM 12/26/2023    4:26 PM  Last 3 Weights  Weight (lbs) 180 lb 182 lb 3.2 oz 182 lb 3.2 oz  Weight (kg) 81.647 kg 82.645 kg 82.645 kg     Body mass index is 29.5 kg/m.  General:  Well nourished, well developed, in no acute distress HEENT: normal Neck: no JVD Vascular: No carotid bruits; Distal pulses 2+ bilaterally Cardiac:  normal S1, S2; RRR; no murmur  Lungs:   clear to auscultation bilaterally, no wheezing, rhonchi or  rales  Abd: soft, nontender, no hepatomegaly  Ext: no edema Musculoskeletal:  No deformities, BUE and BLE strength normal and equal Skin: warm and dry  Neuro:  CNs 2-12 intact, no focal abnormalities noted Psych:  Normal affect   EKG:  The EKG was personally reviewed and demonstrates:  SR HR 76 Telemetry:  Telemetry was personally reviewed and demonstrates:  sinus rhythm with HR 70s  Relevant CV Studies:  LHC 02/2023:   Mid RCA lesion is 90% stenosed.  A drug-eluting stent was successfully placed using a SYNERGY XD 3.50X20, postdilated to greater than 3.75 mm and optimized with intravascular ultrasound.   Post intervention, there is a 0% residual stenosis.   Mid LAD-1 lesion is 90% stenosed.  A drug-eluting stent was successfully placed using a SYNERGY XD 2.75X16, postdilated to greater than 3 mm and optimized with intravascular ultrasound.   Post intervention, there is a 0% residual stenosis.   Mid LAD-2 lesion is 20% stenosed.   2nd Diag lesion is 35% stenosed.  Jailed diagonal maintained TIMI-3 flow.   LV end diastolic pressure is low.   There is no aortic valve stenosis.   Successful two-vessel PCI of the RCA and LAD.  Continue dual antiplatelet therapy for 12 months.  If there are bleeding issues, could stop aspirin early.  Continue aggressive secondary prevention including diabetes control and lipid-lowering therapy.  Mynx closure device was deployed.  Of note, the patient reported severe right arm pain during her diagnostic cath 2 days ago.  I suspect she had severe right radial spasm.  That is why we used the femoral approach.  Laboratory Data:  High Sensitivity Troponin:   Recent Labs  Lab 01/01/24 0130 01/01/24 0416  TROPONINIHS 4 4     Chemistry Recent Labs  Lab 01/01/24 0130  NA 140  K 3.4*  CL 106  CO2 19*  GLUCOSE 135*  BUN 10  CREATININE 0.80  CALCIUM 11.0*  GFRNONAA >60  ANIONGAP 15    No  results for input(s): "PROT", "ALBUMIN", "AST", "ALT", "ALKPHOS", "BILITOT" in the last 168 hours. Lipids No results for input(s): "CHOL", "TRIG", "HDL", "LABVLDL", "LDLCALC", "CHOLHDL" in the last 168 hours.  Hematology Recent Labs  Lab 01/01/24 0130  WBC 8.8  RBC 4.70  HGB 14.1  HCT 41.7  MCV 88.7  MCH 30.0  MCHC 33.8  RDW 13.5  PLT 320   Thyroid No results for input(s): "TSH", "FREET4" in the last 168 hours.  BNPNo results for input(s): "BNP", "PROBNP" in the last 168 hours.  DDimer No results for input(s): "DDIMER" in the last 168 hours.   Radiology/Studies:  CT Head Wo Contrast Result Date: 01/01/2024 CLINICAL DATA:  Headache, new onset (Age >= 51y) EXAM: CT HEAD WITHOUT CONTRAST TECHNIQUE: Contiguous axial images were obtained from the base of the skull through the vertex without intravenous contrast. RADIATION DOSE REDUCTION: This exam was performed according to the departmental dose-optimization program which includes automated exposure control, adjustment of the mA and/or kV according to patient size and/or use of iterative reconstruction technique. COMPARISON:  None Available. FINDINGS: Brain: Normal anatomic configuration. No abnormal intra or extra-axial mass lesion or fluid collection. No abnormal mass effect or midline shift. No evidence of acute intracranial hemorrhage or infarct. Ventricular size is normal. Cerebellum unremarkable. Vascular: Unremarkable Skull: Intact Sinuses/Orbits: Mild mucosal thickening within the inferior maxillary sinuses bilaterally. No air-fluid levels. Remaining paranasal sinuses are clear. Orbits are unremarkable. Other: Mastoid air cells and middle ear cavities are clear. IMPRESSION: 1.  No acute intracranial abnormality. No calvarial fracture. 2. Mild bilateral maxillary sinus disease. Electronically Signed   By: Helyn Numbers M.D.   On: 01/01/2024 03:28   DG Chest 2 View Result Date: 01/01/2024 CLINICAL DATA:  Chest pain, left-sided neck pain.  History of MI with stents. EXAM: CHEST - 2 VIEW COMPARISON:  PA Lat chest 03/31/2023 FINDINGS: The heart size and mediastinal contours are within normal limits. Both lungs are clear. The visualized skeletal structures are intact, with thoracic spondylosis. IMPRESSION: No active cardiopulmonary disease.  Stable chest. Electronically Signed   By: Almira Bar M.D.   On: 01/01/2024 01:52     Assessment and Plan:   Chest pain - hs troponin x 2 negative - EKG does not appear ischemic - CP concerning for stuttering angina that has progressed - no heparin gtt in the setting of negative enzymes, continue to have chest discomfort - given known complex disease, will obtain definitive angiography - will stop pletal and plan to reinstate DAPT after heart cath - continue ASA - will continue 80 mg lipitor, and 12.5 mg toprol - hold losartan for now   Hypertension - continue BB, hold losartan for cath - BP in the room in the 140s systolic   Hyperlipidemia with LDL Goal < 70 03/15/2023: VLDL 40 05/24/2023: Cholesterol, Total 102; HDL 35; LDL Chol Calc (NIH) 46; Triglycerides 113 - continue 80 mg lipitor   DM - A1c 02/2023 was 6.8% - repeat A1c - SSI   Left leg pain - resting ABIs not impressive, but mild to moderate disease shown after exercise (L > R - sounds like she is not tolerating pletal - has been referred to [facility] - will stop pletal - she is tender on her left calf, consider ruling out DVT   Informed Consent   Shared Decision Making/Informed Consent The risks [stroke (1 in 1000), death (1 in 1000), kidney failure [usually temporary] (1 in 500), bleeding (1 in 200), allergic reaction [possibly serious] (1 in 200)], benefits (diagnostic support and management of coronary artery disease) and alternatives of a cardiac catheterization were discussed in detail with Ms. Goucher and she is willing to proceed.        Risk Assessment/Risk Scores:     TIMI Risk Score for Unstable Angina  or Non-ST Elevation MI:   The patient's TIMI risk score is 4, which indicates a 20% risk of all cause mortality, new or recurrent myocardial infarction or need for urgent revascularization in the next 14 days.        For questions or updates, please contact [facility] Please consult [email or web address] for contact info under    Signed, Marcelino Duster, PA  01/01/2024 12:00 PM  ATTENDING ATTESTATION  I have seen, examined and evaluated the patient this afternoon in the ER along with Micah Flesher, PA.  After reviewing all the available data and chart, we discussed the patients laboratory, study & physical findings as well as symptoms in detail.  I agree with her findings, examination as well as impression recommendations as per our discussion.    Attending adjustments noted in italics.   I have discussed the patient's symptoms with her and essentially heard pretty much the same story of different types of chest discomfort over the last several weeks and then the neck pain last night.  It seems like this was can be evaluated with a Myoview ordered by her primary cardiologist, but now with persistent symptoms exacerbating to the point of her coming to  the ER I think it is reasonable to have a more definitive assessment with cardiac catheterization.  I suspect that her symptoms are probably not related to ACS, but are concerning after her and she does have significant cardiac history.  I am concerned that a noninvasive modality would not provide definitive enough information for her to feel comfortable knowing that this is not her heart.  Not convinced that her leg pain is claudication, but would defer to vascular surgery who she is seeing soon.  I think it is reasonable to stop Pletal until she is seen by Vascular, and go back to Brilinta.  Agree with the plan for cardiac catheterization.  This will allow [place] to know better how to continue to manage.     Marykay Lex, MD,  MS Bryan Lemma, M.D., M.S. Interventional Cardiologist  [facility]  Pager # [phone] Phone # [phone] [address] [place], [place] [address]

## 2024-01-01 NOTE — H&P (Deleted)
 Angela Lex, MD Physician Cardiology    Date of Service: 01/01/2024 10:41 AM   Expand All Collapse All      Cardiology H&P    Patient ID: Angela Knight MRN: [id number]; DOB: 03/05/1971   Admit date: 01/01/2024 Date of Consult: 01/01/2024   PCP:  Angela Lek, MD              [facility] Providers Cardiologist:  Angela Constant, MD        Patient Profile:    Angela Knight is a 53 y.o. female with a hx of CAD, hypertension, and DM2 who is being seen 01/01/2024 for the evaluation of chest pain at the request of Angela Knight.   History of Present Illness:    Angela Knight presented with chest pain and elevated troponin 03/14/23.  Echocardiogram showed normal EF, mild MR, normal RV function, and no significant valvular disease. She underwent LHC 03/15/23 that showed high grade 95% mid RCA stenosis followed by aneurysmal dilatation of the RCA as well as 90% mid LAD stenosis at the D2 bifurcation. Case discussed with interventional team and decision was made to proceed with staged intervention:  DES-RCA with 3.5 x 20 mm and DES-mLAD 2.75 x 16 mm. Due to vasospasm, cath ultimately through groin. She tolerated the procedure and discharged on DAPT. 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 She started trying to walk again, but developed left leg pain and issues with left plantar fasciitis, which she has seen podiatry. She started school again but has developed recurrence of chest pain. She was seen by Angela Knight on 12/26/23 with intermittent chest pain that typically began as neck pain and radiated to her left chest and back. She reports one episode of nausea with CP and one episode of diaphoresis with a different chest pain. She was scheduled for a nuclear stress test to evaluate CP. She also discussed left leg pain and underwent ABIs which showed normal resting ABIs bilaterally, but decreased post exercise ABIs with mild disease on right and  moderate disease on the left. Given leg pain, she was taken off brilinta and started on pletal at that visit (12/26/23). Since starting pletal, she has had worsening dizziness, headache, and generally feeling unwell. She has continued to have chest pain with rest (she is not ambulating a lot due to left leg pain) that was rated as a 5-6/10 and lasted 30 min to 2 hrs. Last night, severe 9/10 neck pain woke her from sleep. The neck pain quickly radiated to her chest. She took NTG x 1 and then proceeded to the ER. She took another NTG x 1 in route to the ER. This seemed to mildly decrease her chest pain.    On arrival, she was given a NTG patch which nearly eliminated her chest pain. NTG patch removed this morning due to headache and she now has CP 3-4/10.    Troponin x 2 negative EKG NSR HR 76 sCr 0.8 K 3.4           Past Medical History:  Diagnosis Date   Car sickness     Complication of anesthesia      severe   Family history of adverse reaction to anesthesia      PONV   GERD (gastroesophageal reflux disease)      occ. OTC med used   History of esophageal stricture      stenosis dilatation 05-02-2016  History of gastric polyp      benign 05-02-2016   PONV (postoperative nausea and vomiting)     SVD (spontaneous vaginal delivery)      x 6   Tubulovillous adenoma of rectum      multiple recurrent's   Wears glasses                 Past Surgical History:  Procedure Laterality Date   ANTERIOR AND POSTERIOR REPAIR N/A 05/08/2017    Procedure: ANTERIOR Colporrhaphy (CYSTOCELE) AND POSTERIOR REPAIR (RECTOCELE), PERINEALORRHAPHY;  Surgeon: Angela Mackie, MD;  Location: [facility];  Service: Gynecology;  Laterality: N/A;   COLONOSCOPY WITH PROPOFOL N/A 05/02/2016    Procedure: COLONOSCOPY WITH PROPOFOL;  Surgeon: Angela Bumpers, MD;  Location: [facility];  Service: Endoscopy;  Laterality: N/A;   CORONARY STENT INTERVENTION N/A 03/17/2023    Procedure: CORONARY STENT INTERVENTION;   Surgeon: Angela Crafts, MD;  Location: [facility] INVASIVE CV LAB;  Service: Cardiovascular;  Laterality: N/A;   CORONARY ULTRASOUND/IVUS N/A 03/17/2023    Procedure: Coronary Ultrasound/IVUS;  Surgeon: Angela Crafts, MD;  Location: [facility] INVASIVE CV LAB;  Service: Cardiovascular;  Laterality: N/A;   ESOPHAGOGASTRODUODENOSCOPY (EGD) WITH PROPOFOL N/A 05/02/2016    Procedure: ESOPHAGOGASTRODUODENOSCOPY (EGD) WITH PROPOFOL;  Surgeon: Angela Bumpers, MD;  Location: [facility];  Service: Endoscopy;  Laterality: N/A;   LEFT HEART CATH AND CORONARY ANGIOGRAPHY N/A 03/15/2023    Procedure: LEFT HEART CATH AND CORONARY ANGIOGRAPHY;  Surgeon: Angela Bihari, MD;  Location: [facility] INVASIVE CV LAB;  Service: Cardiovascular;  Laterality: N/A;   LEFT HEART CATH AND CORONARY ANGIOGRAPHY N/A 03/17/2023    Procedure: LEFT HEART CATH AND CORONARY ANGIOGRAPHY;  Surgeon: Angela Crafts, MD;  Location: [facility] INVASIVE CV LAB;  Service: Cardiovascular;  Laterality: N/A;   ROBOTIC ASSISTED TOTAL HYSTERECTOMY WITH SALPINGECTOMY Bilateral 05/08/2017    Procedure: ROBOTIC ASSISTED TOTAL HYSTERECTOMY WITH SALPINGECTOMY And Uterosacral Ligament Suspension;  Surgeon: Angela Mackie, MD;  Location: [facility];  Service: Gynecology;  Laterality: Bilateral;   SIGMOIDOSCOPY   multiple--   TRANSANAL EXCISION OF RECTAL MASS   06-12-2003 and 05-24-2004    tubulovillious adenoma   TUMOR EXCISION N/A 07/05/2016    Procedure: TRANSANAL EXCISION OF TUBULLOVILLOUS ADENOMA OF RECTUM;  Surgeon: Angela Peace, MD;  Location: [facility];  Service: General;  Laterality: N/A;          Home Medications:         Prior to Admission medications   Medication Sig Start Date End Date Taking? Authorizing Provider  aspirin EC 81 MG tablet Take 1 tablet (81 mg total) by mouth daily. Swallow whole. 03/22/23     Knight, Angela George, NP  atorvastatin (LIPITOR) 80 MG tablet Take 1 tablet (80 mg total) by mouth at bedtime. 04/17/23      Angela Constant, MD  cilostazol (PLETAL) 100 MG tablet Take 1 tablet (100 mg total) by mouth 2 (two) times daily. 12/26/23     Angela Constant, MD  famotidine-calcium carbonate-magnesium hydroxide (PEPCID COMPLETE) 10-800-165 MG chewable tablet Chew 1 tablet by mouth as needed (heartburn).       [provider]  gabapentin (NEURONTIN) 300 MG capsule Take 1 capsule (300 mg total) by mouth at bedtime. 11/27/23     Vivi Barrack, DPM  losartan (COZAAR) 25 MG tablet Take 0.5 tablets (12.5 mg total) by mouth daily. 04/17/23     Angela Constant, MD  metFORMIN (GLUCOPHAGE-XR) 500 MG 24  hr tablet Take 500 mg by mouth 2 (two) times daily.       [provider]  metoprolol tartrate (LOPRESSOR) 25 MG tablet Take 0.5 tablets (12.5 mg total) by mouth 2 (two) times daily. 04/17/23     Chandrasekhar, Rondel Jumbo, MD  nitroGLYCERIN (NITROSTAT) 0.4 MG SL tablet Place 1 tablet (0.4 mg total) under the tongue every 5 (five) minutes x 3 doses as needed for chest pain. 03/18/23     Pieter Partridge, MD  OMEPRAZOLE PO Take 1 tablet by mouth daily. Pt unsure of strength       [provider]      Inpatient Medications: Scheduled Meds:       Continuous Infusions:       PRN Meds:         Allergies:    Allergies       Allergies  Allergen Reactions   Codeine Nausea And Vomiting   Coffee Bean Extract [Coffea Arabica] Other (See Comments)      Religious dietary preference: no coffee, chocolate OK   Other        All narcotics causes extreme vomitting   Lisinopril Cough   Pseudoephedrine Palpitations   Surgical Lubricant Itching and Rash      Surgical glue        Social History:   Social History         Socioeconomic History   Marital status: Married      Spouse name: Not on file   Number of children: Not on file   Years of education: Not on file   Highest education level: Not on file  Occupational History   Not on file  Tobacco Use    Smoking status: Never   Smokeless tobacco: Never  Vaping Use   Vaping status: Never Used  Substance and Sexual Activity   Alcohol use: No   Drug use: No   Sexual activity: Yes      Birth control/protection: Condom  Other Topics Concern   Not on file  Social History Narrative   Not on file    Social Drivers of Health        Financial Resource Strain: Not on file  Food Insecurity: Low Risk  (08/14/2023)    Received from [facility]    Hunger Vital Sign     Worried About Running Out of Food in the Last Year: Never true     Ran Out of Food in the Last Year: Never true  Transportation Needs: No Transportation Needs (08/14/2023)    Received from [employer]     In the past 12 months, has lack of reliable transportation kept you from medical appointments, meetings, work or from getting things needed for daily living? : No  Physical Activity: Not on file  Stress: Not on file  Social Connections: Not on file  Intimate Partner Violence: Not At Risk (03/15/2023)    Humiliation, Afraid, Rape, and Kick questionnaire     Fear of Current or Ex-Partner: No     Emotionally Abused: No     Physically Abused: No     Sexually Abused: No    Family History:          Family History  Problem Relation Age of Onset   Diabetes Mother     Cancer Mother     High blood pressure Mother     Cancer Father     Diabetes Sister  ROS:  Please see the history of present illness.    All other ROS reviewed and negative.      Physical Exam/Data:          Vitals:    01/01/24 0738 01/01/24 0830 01/01/24 0845 01/01/24 0900  BP: 129/74 130/70 134/80 126/64  Pulse: 72 64 65 68  Resp: 16 16 15 16   Temp: 98.5 F (36.9 C)        TempSrc:          SpO2: 100% 100% 100% 99%  Weight:   81.6 kg      Height:   5' 5.5" (1.664 m)        No intake or output data in the 24 hours ending 01/01/24 1200     01/01/2024    8:30 AM 01/01/2024    1:27 AM 12/26/2023    4:26 PM   Last 3 Weights  Weight (lbs) 180 lb 182 lb 3.2 oz 182 lb 3.2 oz  Weight (kg) 81.647 kg 82.645 kg 82.645 kg     Body mass index is 29.5 kg/m.  General:  Well nourished, well developed, in no acute distress HEENT: normal Neck: no JVD Vascular: No carotid bruits; Distal pulses 2+ bilaterally Cardiac:  normal S1, S2; RRR; no murmur  Lungs:  clear to auscultation bilaterally, no wheezing, rhonchi or rales  Abd: soft, nontender, no hepatomegaly  Ext: no edema Musculoskeletal:  No deformities, BUE and BLE strength normal and equal Skin: warm and dry  Neuro:  CNs 2-12 intact, no focal abnormalities noted Psych:  Normal affect    EKG:  The EKG was personally reviewed and demonstrates:  SR HR 76 Telemetry:  Telemetry was personally reviewed and demonstrates:  sinus rhythm with HR 70s   Relevant CV Studies:   LHC 02/2023:   Mid RCA lesion is 90% stenosed.  A drug-eluting stent was successfully placed using a SYNERGY XD 3.50X20, postdilated to greater than 3.75 mm and optimized with intravascular ultrasound.   Post intervention, there is a 0% residual stenosis.   Mid LAD-1 lesion is 90% stenosed.  A drug-eluting stent was successfully placed using a SYNERGY XD 2.75X16, postdilated to greater than 3 mm and optimized with intravascular ultrasound.   Post intervention, there is a 0% residual stenosis.   Mid LAD-2 lesion is 20% stenosed.   2nd Diag lesion is 35% stenosed.  Jailed diagonal maintained TIMI-3 flow.   LV end diastolic pressure is low.   There is no aortic valve stenosis.   Successful two-vessel PCI of the RCA and LAD.  Continue dual antiplatelet therapy for 12 months.  If there are bleeding issues, could stop aspirin early.  Continue aggressive secondary prevention including diabetes control and lipid-lowering therapy.  Mynx closure device was deployed.  Of note, the patient reported severe right arm pain during her diagnostic cath 2 days ago.  I suspect she had severe right radial  spasm.  That is why we used the femoral approach.   Laboratory Data:   High Sensitivity Troponin:   Last Labs      Recent Labs  Lab 01/01/24 0130 01/01/24 0416  TROPONINIHS 4 4       Chemistry Last Labs     Recent Labs  Lab 01/01/24 0130  NA 140  K 3.4*  CL 106  CO2 19*  GLUCOSE 135*  BUN 10  CREATININE 0.80  CALCIUM 11.0*  GFRNONAA >60  ANIONGAP 15      Last Labs  No results  for input(s): "PROT", "ALBUMIN", "AST", "ALT", "ALKPHOS", "BILITOT" in the last 168 hours.   Lipids  Last Labs  No results for input(s): "CHOL", "TRIG", "HDL", "LABVLDL", "LDLCALC", "CHOLHDL" in the last 168 hours.    Hematology Last Labs     Recent Labs  Lab 01/01/24 0130  WBC 8.8  RBC 4.70  HGB 14.1  HCT 41.7  MCV 88.7  MCH 30.0  MCHC 33.8  RDW 13.5  PLT 320      Thyroid  Last Labs  No results for input(s): "TSH", "FREET4" in the last 168 hours.    BNP Last Labs  No results for input(s): "BNP", "PROBNP" in the last 168 hours.    DDimer  Last Labs  No results for input(s): "DDIMER" in the last 168 hours.       Radiology/Studies:  CT Head Wo Contrast Result Date: 01/01/2024 CLINICAL DATA:  Headache, new onset (Age >= 51y) EXAM: CT HEAD WITHOUT CONTRAST TECHNIQUE: Contiguous axial images were obtained from the base of the skull through the vertex without intravenous contrast. RADIATION DOSE REDUCTION: This exam was performed according to the departmental dose-optimization program which includes automated exposure control, adjustment of the mA and/or kV according to patient size and/or use of iterative reconstruction technique. COMPARISON:  None Available. FINDINGS: Brain: Normal anatomic configuration. No abnormal intra or extra-axial mass lesion or fluid collection. No abnormal mass effect or midline shift. No evidence of acute intracranial hemorrhage or infarct. Ventricular size is normal. Cerebellum unremarkable. Vascular: Unremarkable Skull: Intact Sinuses/Orbits: Mild  mucosal thickening within the inferior maxillary sinuses bilaterally. No air-fluid levels. Remaining paranasal sinuses are clear. Orbits are unremarkable. Other: Mastoid air cells and middle ear cavities are clear. IMPRESSION: 1. No acute intracranial abnormality. No calvarial fracture. 2. Mild bilateral maxillary sinus disease. Electronically Signed   By: Helyn Numbers M.D.   On: 01/01/2024 03:28    DG Chest 2 View Result Date: 01/01/2024 CLINICAL DATA:  Chest pain, left-sided neck pain. History of MI with stents. EXAM: CHEST - 2 VIEW COMPARISON:  PA Lat chest 03/31/2023 FINDINGS: The heart size and mediastinal contours are within normal limits. Both lungs are clear. The visualized skeletal structures are intact, with thoracic spondylosis. IMPRESSION: No active cardiopulmonary disease.  Stable chest. Electronically Signed   By: Almira Bar M.D.   On: 01/01/2024 01:52        Assessment and Plan:    Chest pain - hs troponin x 2 negative - EKG does not appear ischemic - CP concerning for stuttering angina that has progressed - no heparin gtt in the setting of negative enzymes, continue to have chest discomfort - given known complex disease, will obtain definitive angiography - will stop pletal and plan to reinstate DAPT after heart cath - continue ASA - will continue 80 mg lipitor, and 12.5 mg toprol - hold losartan for now     Hypertension - continue BB, hold losartan for cath - BP in the room in the 140s systolic     Hyperlipidemia with LDL Goal < 70 03/15/2023: VLDL 40 05/24/2023: Cholesterol, Total 102; HDL 35; LDL Chol Calc (NIH) 46; Triglycerides 113 - continue 80 mg lipitor     DM - A1c 02/2023 was 6.8% - repeat A1c - SSI     Left leg pain - resting ABIs not impressive, but mild to moderate disease shown after exercise (L > R - sounds like she is not tolerating pletal - has been referred to [facility] - will stop pletal -  she is tender on her left calf, consider ruling out  DVT     Informed Consent Shared Decision Making/Informed Consent The risks [stroke (1 in 1000), death (1 in 1000), kidney failure [usually temporary] (1 in 500), bleeding (1 in 200), allergic reaction [possibly serious] (1 in 200)], benefits (diagnostic support and management of coronary artery disease) and alternatives of a cardiac catheterization were discussed in detail with Angela Knight and she is willing to proceed.             Risk Assessment/Risk Scores:     TIMI Risk Score for Unstable Angina or Non-ST Elevation MI:   The patient's TIMI risk score is 4, which indicates a 20% risk of all cause mortality, new or recurrent myocardial infarction or need for urgent revascularization in the next 14 days.         For questions or updates, please contact [facility] Please consult [email or web address] for contact info under      Signed, Marcelino Duster, PA  01/01/2024 12:00 PM   ATTENDING ATTESTATION   I have seen, examined and evaluated the patient this afternoon in the ER along with Micah Flesher, PA.  After reviewing all the available data and chart, we discussed the patients laboratory, study & physical findings as well as symptoms in detail.  I agree with her findings, examination as well as impression recommendations as per our discussion.     Attending adjustments noted in italics.    I have discussed the patient's symptoms with her and essentially heard pretty much the same story of different types of chest discomfort over the last several weeks and then the neck pain last night.  It seems like this was can be evaluated with a Myoview ordered by her primary cardiologist, but now with persistent symptoms exacerbating to the point of her coming to the ER I think it is reasonable to have a more definitive assessment with cardiac catheterization.   I suspect that her symptoms are probably not related to ACS, but are concerning after her and she does have significant cardiac  history.  I am concerned that a noninvasive modality would not provide definitive enough information for her to feel comfortable knowing that this is not her heart.   Not convinced that her leg pain is claudication, but would defer to vascular surgery who she is seeing soon.  I think it is reasonable to stop Pletal until she is seen by Vascular, and go back to Brilinta.   Agree with the plan for cardiac catheterization.  This will allow [place] to know better how to continue to manage.         Angela Lex, MD, MS Bryan Lemma, M.D., M.S. Interventional Cardiologist  [facility]  Pager # [phone] Phone # [phone] [address] [place], [place] [address]           Revision History  Routing History

## 2024-01-01 NOTE — ED Notes (Signed)
 Pt to cath lab.

## 2024-01-01 NOTE — ED Notes (Signed)
 Nitroglycerin removed.

## 2024-01-01 NOTE — ED Provider Triage Note (Signed)
 Emergency Medicine Provider Triage Evaluation Note  ESTIE SPROULE , a 53 y.o. female  was evaluated in triage.  Pt complains of left-sided chest pain and left-sided neck pain which woke her from sleep at midnight.  Patient also complains of severe generalized headache.  Review of Systems  Positive:  Negative:   Physical Exam  BP (!) 145/75   Pulse 79   Temp 98.6 F (37 C) (Oral)   Resp (!) 24   Wt 82.6 kg   LMP 04/27/2017 (Approximate)   SpO2 98%   BMI 29.86 kg/m  Gen:   Awake, no distress   Resp:  Normal effort  MSK:   Moves extremities without difficulty  Other:    Medical Decision Making  Medically screening exam initiated at 2:07 AM.  Appropriate orders placed.  MONIC ENGELMANN was informed that the remainder of the evaluation will be completed by another provider, this initial triage assessment does not replace that evaluation, and the importance of remaining in the ED until their evaluation is complete.     Darrick Grinder, [place] 01/01/24 0207

## 2024-01-01 NOTE — ED Provider Notes (Signed)
  EMERGENCY DEPARTMENT AT [facility] Provider Note   CSN: [id number] Arrival date & time: 01/01/24  4098     History  Chief Complaint  Patient presents with   Chest Pain   Neck Pain    Angela Knight is a 53 y.o. female.  53 year old female presents today for concern of chest pain, left shoulder pain, left-sided neck pain.  She has history of CAD and is status post 2 PCI's that replaced in May 2024.  She states she recently saw her cardiologist for similar symptoms and has a stress test scheduled for next week.  She states her episode that she had last night was intense and she cannot wait until next week.  Currently she reports a mild chest pain that she rates at a 4/10.  The history is provided by the patient. No language interpreter was used.       Home Medications Prior to Admission medications   Medication Sig Start Date End Date Taking? Authorizing Provider  aspirin EC 81 MG tablet Take 1 tablet (81 mg total) by mouth daily. Swallow whole. 03/22/23   Swinyer, Zachary George, NP  atorvastatin (LIPITOR) 80 MG tablet Take 1 tablet (80 mg total) by mouth at bedtime. 04/17/23   Christell Constant, MD  cilostazol (PLETAL) 100 MG tablet Take 1 tablet (100 mg total) by mouth 2 (two) times daily. 12/26/23   Christell Constant, MD  famotidine-calcium carbonate-magnesium hydroxide (PEPCID COMPLETE) [phone] MG chewable tablet Chew 1 tablet by mouth as needed (heartburn).    [provider]  gabapentin (NEURONTIN) 300 MG capsule Take 1 capsule (300 mg total) by mouth at bedtime. 11/27/23   Vivi Barrack, DPM  losartan (COZAAR) 25 MG tablet Take 0.5 tablets (12.5 mg total) by mouth daily. 04/17/23   Chandrasekhar, Lafayette Dragon A, MD  metFORMIN (GLUCOPHAGE-XR) 500 MG 24 hr tablet Take 500 mg by mouth 2 (two) times daily.    [provider]  metoprolol tartrate (LOPRESSOR) 25 MG tablet Take 0.5 tablets (12.5 mg total) by mouth 2 (two) times daily.  04/17/23   Chandrasekhar, Rondel Jumbo, MD  nitroGLYCERIN (NITROSTAT) 0.4 MG SL tablet Place 1 tablet (0.4 mg total) under the tongue every 5 (five) minutes x 3 doses as needed for chest pain. 03/18/23   Pieter Partridge, MD  OMEPRAZOLE PO Take 1 tablet by mouth daily. Pt unsure of strength    [provider]      Allergies    Codeine, Coffee bean extract [coffea arabica], Other, Lisinopril, Pseudoephedrine, and Surgical lubricant    Review of Systems   Review of Systems  Constitutional:  Negative for chills and fever.  Respiratory:  Negative for shortness of breath.   Cardiovascular:  Positive for chest pain. Negative for palpitations and leg swelling.  Gastrointestinal:  Negative for abdominal pain, nausea and vomiting.  Musculoskeletal:  Positive for neck pain.  Neurological:  Negative for light-headedness.  All other systems reviewed and are negative.   Physical Exam Updated Vital Signs BP 126/64   Pulse 68   Temp 98.5 F (36.9 C)   Resp 16   Ht 5' 5.5" (1.664 m)   Wt 81.6 kg   LMP 04/27/2017 (Approximate)   SpO2 99%   BMI 29.50 kg/m  Physical Exam Vitals and nursing note reviewed.  Constitutional:      General: She is not in acute distress.    Appearance: Normal appearance. She is not ill-appearing.  HENT:  Head: Normocephalic and atraumatic.     Nose: Nose normal.  Eyes:     General: No scleral icterus.    Extraocular Movements: Extraocular movements intact.     Conjunctiva/sclera: Conjunctivae normal.  Cardiovascular:     Rate and Rhythm: Normal rate and regular rhythm.  Pulmonary:     Effort: Pulmonary effort is normal. No respiratory distress.     Breath sounds: Normal breath sounds. No wheezing or rales.  Abdominal:     General: There is no distension.     Tenderness: There is no abdominal tenderness.  Musculoskeletal:        General: Normal range of motion.     Cervical back: Normal range of motion.     Right lower leg: No edema.      Left lower leg: No edema.  Skin:    General: Skin is warm and dry.  Neurological:     General: No focal deficit present.     Mental Status: She is alert. Mental status is at baseline.     ED Results / Procedures / Treatments   Labs (all labs ordered are listed, but only abnormal results are displayed) Labs Reviewed  BASIC METABOLIC PANEL - Abnormal; Notable for the following components:      Result Value   Potassium 3.4 (*)    CO2 19 (*)    Glucose, Bld 135 (*)    Calcium 11.0 (*)    All other components within normal limits  CBC  TROPONIN I (HIGH SENSITIVITY)  TROPONIN I (HIGH SENSITIVITY)    EKG EKG Interpretation Date/Time:  Monday January 01 2024 01:25:54 EDT Ventricular Rate:  76 PR Interval:  154 QRS Duration:  74 QT Interval:  386 QTC Calculation: 434 R Axis:   75  Text Interpretation: Normal sinus rhythm Low voltage QRS Borderline ECG When compared with ECG of 01-Jan-2024 01:23, PREVIOUS ECG IS PRESENT No acute changes Confirmed by Drema Pry [phone]) on 01/01/2024 3:20:53 AM  Radiology CT Head Wo Contrast Result Date: 01/01/2024 CLINICAL DATA:  Headache, new onset (Age >= 51y) EXAM: CT HEAD WITHOUT CONTRAST TECHNIQUE: Contiguous axial images were obtained from the base of the skull through the vertex without intravenous contrast. RADIATION DOSE REDUCTION: This exam was performed according to the departmental dose-optimization program which includes automated exposure control, adjustment of the mA and/or kV according to patient size and/or use of iterative reconstruction technique. COMPARISON:  None Available. FINDINGS: Brain: Normal anatomic configuration. No abnormal intra or extra-axial mass lesion or fluid collection. No abnormal mass effect or midline shift. No evidence of acute intracranial hemorrhage or infarct. Ventricular size is normal. Cerebellum unremarkable. Vascular: Unremarkable Skull: Intact Sinuses/Orbits: Mild mucosal thickening within the inferior  maxillary sinuses bilaterally. No air-fluid levels. Remaining paranasal sinuses are clear. Orbits are unremarkable. Other: Mastoid air cells and middle ear cavities are clear. IMPRESSION: 1. No acute intracranial abnormality. No calvarial fracture. 2. Mild bilateral maxillary sinus disease. Electronically Signed   By: Helyn Numbers M.D.   On: 01/01/2024 03:28   DG Chest 2 View Result Date: 01/01/2024 CLINICAL DATA:  Chest pain, left-sided neck pain. History of MI with stents. EXAM: CHEST - 2 VIEW COMPARISON:  PA Lat chest 03/31/2023 FINDINGS: The heart size and mediastinal contours are within normal limits. Both lungs are clear. The visualized skeletal structures are intact, with thoracic spondylosis. IMPRESSION: No active cardiopulmonary disease.  Stable chest. Electronically Signed   By: Almira Bar M.D.   On: 01/01/2024 01:52  Procedures Procedures    Medications Ordered in ED Medications  nitroGLYCERIN (NITROGLYN) 2 % ointment 1 inch (1 inch Topical Given 01/01/24 0831)  acetaminophen (TYLENOL) tablet 1,000 mg (1,000 mg Oral Given 01/01/24 0944)    ED Course/ Medical Decision Making/ A&P                                 Medical Decision Making Amount and/or Complexity of Data Reviewed Labs: ordered. Radiology: ordered.  Risk OTC drugs. Prescription drug management.   Medical Decision Making / ED Course   This patient presents to the ED for concern of chest pain, this involves an extensive number of treatment options, and is a complaint that carries with it a high risk of complications and morbidity.  The differential diagnosis includes ACS, PE, pneumonia, MSK etiology, GERD   MDM: 53 year old female presents today for concern of chest pain.  She is due to undergo stress test next week.  She was recently seen by cardiology.  She states her episodes are intensifying and needs a sooner eval.  Admission considered but will reevaluate after labs and imaging.  CBC  unremarkable, BMP with potassium of 3.4 otherwise without acute concern.  Troponin negative x 2.  CT head, chest x-ray without acute findings.  EKG without acute ischemic change.  Seen by cardiology.  They plan to cath later today.  They recommend medicine admission due to reassuring workup so far.  Discussed with Dr. Katrinka Blazing of hospitalist service.  He will evaluate patient for admission.   Additional history obtained: -Additional history obtained from previous cardiology visits -External records from outside source obtained and reviewed including: Chart review including previous notes, labs, imaging, consultation notes   Lab Tests: -I ordered, reviewed, and interpreted labs.   The pertinent results include:   Labs Reviewed  BASIC METABOLIC PANEL - Abnormal; Notable for the following components:      Result Value   Potassium 3.4 (*)    CO2 19 (*)    Glucose, Bld 135 (*)    Calcium 11.0 (*)    All other components within normal limits  CBC  TROPONIN I (HIGH SENSITIVITY)  TROPONIN I (HIGH SENSITIVITY)      EKG  EKG Interpretation Date/Time:  Monday January 01 2024 01:25:54 EDT Ventricular Rate:  76 PR Interval:  154 QRS Duration:  74 QT Interval:  386 QTC Calculation: 434 R Axis:   75  Text Interpretation: Normal sinus rhythm Low voltage QRS Borderline ECG When compared with ECG of 01-Jan-2024 01:23, PREVIOUS ECG IS PRESENT No acute changes Confirmed by Drema Pry [phone]) on 01/01/2024 3:20:53 AM         Imaging Studies ordered: I ordered imaging studies including chest x-ray, CT head I independently visualized and interpreted imaging. I agree with the radiologist interpretation   Medicines ordered and prescription drug management: Meds ordered this encounter  Medications   nitroGLYCERIN (NITROGLYN) 2 % ointment 1 inch   acetaminophen (TYLENOL) tablet 1,000 mg   DISCONTD: aspirin chewable tablet 81 mg   FOLLOWED BY Linked Order Group    0.9% sodium chloride  infusion    0.9% sodium chloride infusion   aspirin chewable tablet 81 mg   potassium chloride SA (KLOR-CON M) CR tablet 40 mEq    -I have reviewed the patients home medicines and have made adjustments as needed   Consultations Obtained: I requested consultation with the cardiology,  and discussed lab  and imaging findings as well as pertinent plan - they recommend: As above   Cardiac Monitoring: The patient was maintained on a cardiac monitor.  I personally viewed and interpreted the cardiac monitored which showed an underlying rhythm of: Normal sinus rhythm  Social Determinants of Health:  Factors impacting patients care include: Good social support   Reevaluation: After the interventions noted above, I reevaluated the patient and found that they have :improved  Co morbidities that complicate the patient evaluation  Past Medical History:  Diagnosis Date   Car sickness    Complication of anesthesia    severe   Family history of adverse reaction to anesthesia    PONV   GERD (gastroesophageal reflux disease)    occ. OTC med used   History of esophageal stricture    stenosis dilatation 05-02-2016   History of gastric polyp    benign 05-02-2016   PONV (postoperative nausea and vomiting)    SVD (spontaneous vaginal delivery)    x 6   Tubulovillous adenoma of rectum    multiple recurrent's   Wears glasses       Dispostion:  Discussed with hospitalist will evaluate patient for admission.   Final Clinical Impression(s) / ED Diagnoses Final diagnoses:  Precordial chest pain    Rx / DC Orders ED Discharge Orders     None         Marita Kansas, PA-C 01/01/24 1513    Gloris Manchester, MD 01/01/24 1553

## 2024-01-01 NOTE — ED Notes (Signed)
CT at bedside 

## 2024-01-01 NOTE — H&P (Signed)
 History and Physical    Patient: Angela Knight DOB: [birth year] DOA: 01/01/2024 DOS: the patient was seen and examined on 01/01/2024 PCP: Delorse Lek, MD  Patient coming from: Home  Chief Complaint:  Chief Complaint  Patient presents with   Chest Pain   Neck Pain   HPI: Angela Knight is a 53 y.o. female with medical history significant of hypertension, CAD s/p DES to RCA and LAD in 02/2023, peripheral artery disease, and diabetes mellitus type 2 who presents with complaints of chest pain. She has been experiencing episodes of chest pain over the past two to three months, primarily occurring at rest and notably at night. Last night, she was woken from sleep by severe 9/10 left-sided neck and shoulder pain that went into her chest chest and radiated into her back between the shoulder blades. This episode was accompanied by chills and elevated blood pressure readings, prompting her to take nitroglycerin which seemed only mildly reduced pain symptoms.  Denied having any shortness of breath or nausea with this particular episode, although she experienced shortness of breath with the episode last week.    She has a history of a myocardial infarction one year ago. She monitors her blood pressure at home and noted an increase from 140/80 to 160/80 during the recent episode.  She has type 2 diabetes managed with metformin, which she took last night but has not taken today. She has lost approximately 20 pounds since her heart attack, with some fluctuation during the holidays.  She reports left leg issues, including thigh cramps and plantar fasciitis, which have been exacerbated by wearing braces, leading to significant calf pain. However, there is no noticeable leg swelling.  She had recently seen cardiology on 3/4 for her symptoms and there were plans for outpatient stress testing.  Also during that appointment due to her recent ABIs showing normal resting ABIs bilaterally, but decreased  post exercise ABIs with mild disease on right and moderate disease on the left.  She was taken off brilinta and started on Pletal  Since starting pletal, she has had complaints of dizziness and headache.  Upon admission into the emergency department patient was noted to be pulse 58-79, and all other vital signs relatively maintained.  Labs significant for high-sensitivity troponin troponin negative x 2, potassium 3.4, and calcium 11.  Chest x-ray showed no acute abnormality CT scan of the head did not note any acute abnormality.  Patient had been given aspirin, acetaminophen 1000 mg p.o., potassium chloride 40 mEq p.o, and nitroglycerin paste-chest pain  Review of Systems: As mentioned in the history of present illness. All other systems reviewed and are negative. Past Medical History:  Diagnosis Date   Car sickness    Complication of anesthesia    severe   Family history of adverse reaction to anesthesia    PONV   GERD (gastroesophageal reflux disease)    occ. OTC med used   History of esophageal stricture    stenosis dilatation 05-02-2016   History of gastric polyp    benign 05-02-2016   PONV (postoperative nausea and vomiting)    SVD (spontaneous vaginal delivery)    x 6   Tubulovillous adenoma of rectum    multiple recurrent's   Wears glasses    Past Surgical History:  Procedure Laterality Date   ANTERIOR AND POSTERIOR REPAIR N/A 05/08/2017   Procedure: ANTERIOR Colporrhaphy (CYSTOCELE) AND POSTERIOR REPAIR (RECTOCELE), PERINEALORRHAPHY;  Surgeon: Olivia Mackie, MD;  Location: [facility];  Service:  Gynecology;  Laterality: N/A;   COLONOSCOPY WITH PROPOFOL N/A 05/02/2016   Procedure: COLONOSCOPY WITH PROPOFOL;  Surgeon: Charolett Bumpers, MD;  Location: [facility];  Service: Endoscopy;  Laterality: N/A;   CORONARY STENT INTERVENTION N/A 03/17/2023   Procedure: CORONARY STENT INTERVENTION;  Surgeon: Corky Crafts, MD;  Location: [facility] INVASIVE CV LAB;  Service: Cardiovascular;   Laterality: N/A;   CORONARY ULTRASOUND/IVUS N/A 03/17/2023   Procedure: Coronary Ultrasound/IVUS;  Surgeon: Corky Crafts, MD;  Location: [facility] INVASIVE CV LAB;  Service: Cardiovascular;  Laterality: N/A;   ESOPHAGOGASTRODUODENOSCOPY (EGD) WITH PROPOFOL N/A 05/02/2016   Procedure: ESOPHAGOGASTRODUODENOSCOPY (EGD) WITH PROPOFOL;  Surgeon: Charolett Bumpers, MD;  Location: [facility];  Service: Endoscopy;  Laterality: N/A;   LEFT HEART CATH AND CORONARY ANGIOGRAPHY N/A 03/15/2023   Procedure: LEFT HEART CATH AND CORONARY ANGIOGRAPHY;  Surgeon: Lennette Bihari, MD;  Location: [facility] INVASIVE CV LAB;  Service: Cardiovascular;  Laterality: N/A;   LEFT HEART CATH AND CORONARY ANGIOGRAPHY N/A 03/17/2023   Procedure: LEFT HEART CATH AND CORONARY ANGIOGRAPHY;  Surgeon: Corky Crafts, MD;  Location: [facility] INVASIVE CV LAB;  Service: Cardiovascular;  Laterality: N/A;   ROBOTIC ASSISTED TOTAL HYSTERECTOMY WITH SALPINGECTOMY Bilateral 05/08/2017   Procedure: ROBOTIC ASSISTED TOTAL HYSTERECTOMY WITH SALPINGECTOMY And Uterosacral Ligament Suspension;  Surgeon: Olivia Mackie, MD;  Location: [facility];  Service: Gynecology;  Laterality: Bilateral;   SIGMOIDOSCOPY  multiple--   TRANSANAL EXCISION OF RECTAL MASS  06-12-2003 and 05-24-2004   tubulovillious adenoma   TUMOR EXCISION N/A 07/05/2016   Procedure: TRANSANAL EXCISION OF TUBULLOVILLOUS ADENOMA OF RECTUM;  Surgeon: Avel Peace, MD;  Location: [facility];  Service: General;  Laterality: N/A;   Social History:  reports that she has never smoked. She has never used smokeless tobacco. She reports that she does not drink alcohol and does not use drugs.  Allergies  Allergen Reactions   Codeine Nausea And Vomiting   Coffee Bean Extract [Coffea Arabica] Other (See Comments)    Religious dietary preference: no coffee, chocolate OK   Other     All narcotics causes extreme vomitting   Lisinopril Cough   Pseudoephedrine Palpitations   Surgical  Lubricant Itching and Rash    Surgical glue    Family History  Problem Relation Age of Onset   Diabetes Mother    Cancer Mother    High blood pressure Mother    Cancer Father    Diabetes Sister     Prior to Admission medications   Medication Sig Start Date End Date Taking? Authorizing Provider  aspirin EC 81 MG tablet Take 1 tablet (81 mg total) by mouth daily. Swallow whole. 03/22/23   Swinyer, Zachary George, NP  atorvastatin (LIPITOR) 80 MG tablet Take 1 tablet (80 mg total) by mouth at bedtime. 04/17/23   Christell Constant, MD  cilostazol (PLETAL) 100 MG tablet Take 1 tablet (100 mg total) by mouth 2 (two) times daily. 12/26/23   Christell Constant, MD  famotidine-calcium carbonate-magnesium hydroxide (PEPCID COMPLETE) 10-800-165 MG chewable tablet Chew 1 tablet by mouth as needed (heartburn).    [provider]  gabapentin (NEURONTIN) 300 MG capsule Take 1 capsule (300 mg total) by mouth at bedtime. 11/27/23   Vivi Barrack, DPM  losartan (COZAAR) 25 MG tablet Take 0.5 tablets (12.5 mg total) by mouth daily. 04/17/23   Chandrasekhar, Lafayette Dragon A, MD  metFORMIN (GLUCOPHAGE-XR) 500 MG 24 hr tablet Take 500 mg by mouth 2 (two) times daily.  [provider]  metoprolol tartrate (LOPRESSOR) 25 MG tablet Take 0.5 tablets (12.5 mg total) by mouth 2 (two) times daily. 04/17/23   Chandrasekhar, Rondel Jumbo, MD  nitroGLYCERIN (NITROSTAT) 0.4 MG SL tablet Place 1 tablet (0.4 mg total) under the tongue every 5 (five) minutes x 3 doses as needed for chest pain. 03/18/23   Pieter Partridge, MD  OMEPRAZOLE PO Take 1 tablet by mouth daily. Pt unsure of strength    [provider]    Physical Exam: Vitals:   01/01/24 0900 01/01/24 0930 01/01/24 1313 01/01/24 1316  BP: 126/64 116/67 124/79 124/79  Pulse: 68 64 (!) 58   Resp: 16 12 13    Temp:   97.6 F (36.4 C)   TempSrc:   Oral   SpO2: 99% 99% 97%   Weight:      Height:        Constitutional: Middle-aged  female who appears to be in no acute distress Eyes: PERRL, lids and conjunctivae normal ENMT: Mucous membranes are moist.  Normal dentition.  Neck: normal, supple, no JVD appreciated Respiratory: clear to auscultation bilaterally, no wheezing, no crackles. Normal respiratory effort. No accessory muscle use.  Cardiovascular: Regular rate and rhythm, no murmurs / rubs / gallops. No extremity edema.   Abdomen: no tenderness, no masses palpated.   Bowel sounds positive.  Musculoskeletal: no clubbing / cyanosis. No joint deformity upper and lower extremities. Good ROM, no contractures. Normal muscle tone.  Skin: no rashes, lesions, ulcers.   Neurologic: CN 2-12 grossly intact. Strength 5/5 in all 4.  Psychiatric: Normal judgment and insight. Alert and oriented x 3. Normal mood.   Data Reviewed:  EKG revealed normal sinus rhythm at 76 bpm with low voltage QRS.  Assessment and Plan: Chest pain CAD Patient reports having left-sided chest pain with radiation into her neck and back.  Patient had taken nitroglycerin.  High-sensitivity troponins were negative x 2.  EKG was without significant ischemic changes.  Cardiology had been formally consulted and plan to take the patient for cardiac catheterization.  Patient last left heart cath was in 02/2023 where patient had a drug-eluting stent placed to the mid RCA and mid LAD.  Questioning stuttering angina. -Admit to a cardiac telemetry bed -N.p.o. for possible need of procedure -Continue aspirin, beta-blocker, and statin -Appreciate cardiology consultative services, will follow-up for any further recommendations  Essential hypertension Blood pressures currently maintained. -Hold losartan for -Continue beta-blocker  Controlled diabetes mellitus type 2, without long-term use of insulin On admission glucose noted to be 135.  Patient on metformin for treatment.  Last hemoglobin A1c was 6.8 when checked 02/2023. -Hypoglycemia protocols -Check hemoglobin  A1c -Hold metformin  Hypercalcemia Acute on chronic.  Calcium noted to be elevated at 11 but had intermittently noted to be elevated in July and June of last year.  Patient denies any calcium supplementation -Add on PTH   Hyperlipidemia Last lipid panel noted total cholesterol 102, HDL 35, triglycerides 113, and LDL 46 when checked 05/24/2023. -Continue atorvastatin  Peripheral vascular disease Left leg pain Patient underwent recent ABIs 12/01/2023 which noted decreased post exercise ABIs with mild disease on right and moderate disease on the left.  She had been started on Pletal, but reported dizziness and headaches. -Hold Pletal -Continue statin  GERD -Continue pharmacy substitution of Protonix  DVT prophylaxis: Lovenox Advance Care Planning:   Code Status: Prior   Consults: Cardiology  Family Communication: Husband updated at bedside  Severity of Illness: The appropriate patient  status for this patient is OBSERVATION. Observation status is judged to be reasonable and necessary in order to provide the required intensity of service to ensure the patient's safety. The patient's presenting symptoms, physical exam findings, and initial radiographic and laboratory data in the context of their medical condition is felt to place them at decreased risk for further clinical deterioration. Furthermore, it is anticipated that the patient will be medically stable for discharge from the hospital within 2 midnights of admission.   Author: Clydie Braun, MD 01/01/2024 3:04 PM  For on call review [email or web address].

## 2024-01-01 NOTE — Interval H&P Note (Signed)
 History and Physical Interval Note:  01/01/2024 5:01 PM  Angela Knight  has presented today for surgery, with the diagnosis of unstable angina.  The various methods of treatment have been discussed with the patient and family. After consideration of risks, benefits and other options for treatment, the patient has consented to  Procedure(s): LEFT HEART CATH AND CORONARY ANGIOGRAPHY (N/A) as a surgical intervention.  The patient's history has been reviewed, patient examined, no change in status, stable for surgery.  I have reviewed the patient's chart and labs.  Questions were answered to the patient's satisfaction.    Cath Lab Visit (complete for each Cath Lab visit)  Clinical Evaluation Leading to the Procedure:   ACS: Yes.   (Unstable angina)  Non-ACS:  N/A  [name]

## 2024-01-01 NOTE — Consult Note (Addendum)
 Cardiology Consultation   Patient ID: Angela Knight MRN: [id number]; DOB: February 13, 1971  Admit date: 01/01/2024 Date of Consult: 01/01/2024  PCP:  Angela Lek, MD   [facility] Providers Cardiologist:  Angela Constant, MD      Patient Profile:   Angela Knight is a 53 y.o. female with a hx of CAD, hypertension, and DM2 who is being seen 01/01/2024 for the evaluation of chest pain at the request of Angela Knight.  History of Present Illness:   Angela Knight presented with chest pain and elevated troponin 03/14/23.  Echocardiogram showed normal EF, mild MR, normal RV function, and no significant valvular disease. She underwent LHC 03/15/23 that showed high grade 95% mid RCA stenosis followed by aneurysmal dilatation of the RCA as well as 90% mid LAD stenosis at the D2 bifurcation. Case discussed with interventional team and decision was made to proceed with staged intervention:  DES-RCA with 3.5 x 20 mm and DES-mLAD 2.75 x 16 mm. Due to vasospasm, cath ultimately through groin. She tolerated the procedure and discharged on DAPT. 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 She started trying to walk again, but developed left leg pain and issues with left plantar fasciitis, which she has seen podiatry. She started school again but has developed recurrence of chest pain. She was seen by Angela Knight on 12/26/23 with intermittent chest pain that typically began as neck pain and radiated to her left chest and back. She reports one episode of nausea with CP and one episode of diaphoresis with a different chest pain. She was scheduled for a nuclear stress test to evaluate CP. She also discussed left leg pain and underwent ABIs which showed normal resting ABIs bilaterally, but decreased post exercise ABIs with mild disease on right and moderate disease on the left. Given leg pain, she was taken off brilinta and started on pletal at that visit (12/26/23). Since starting  pletal, she has had worsening dizziness, headache, and generally feeling unwell. She has continued to have chest pain with rest (she is not ambulating a lot due to left leg pain) that was rated as a 5-6/10 and lasted 30 min to 2 hrs. Last night, severe 9/10 neck pain woke her from sleep. The neck pain quickly radiated to her chest. She took NTG x 1 and then proceeded to the ER. She took another NTG x 1 in route to the ER. This seemed to mildly decrease her chest pain.   On arrival, she was given a NTG patch which nearly eliminated her chest pain. NTG patch removed this morning due to headache and she now has CP 3-4/10.   Troponin x 2 negative EKG NSR HR 76 sCr 0.8 K 3.4    Past Medical History:  Diagnosis Date   Car sickness    Complication of anesthesia    severe   Family history of adverse reaction to anesthesia    PONV   GERD (gastroesophageal reflux disease)    occ. OTC med used   History of esophageal stricture    stenosis dilatation 05-02-2016   History of gastric polyp    benign 05-02-2016   PONV (postoperative nausea and vomiting)    SVD (spontaneous vaginal delivery)    x 6   Tubulovillous adenoma of rectum    multiple recurrent's   Wears glasses     Past Surgical History:  Procedure Laterality Date   ANTERIOR AND POSTERIOR REPAIR N/A 05/08/2017  Procedure: ANTERIOR Colporrhaphy (CYSTOCELE) AND POSTERIOR REPAIR (RECTOCELE), PERINEALORRHAPHY;  Surgeon: Olivia Mackie, MD;  Location: [facility];  Service: Gynecology;  Laterality: N/A;   COLONOSCOPY WITH PROPOFOL N/A 05/02/2016   Procedure: COLONOSCOPY WITH PROPOFOL;  Surgeon: Charolett Bumpers, MD;  Location: [facility];  Service: Endoscopy;  Laterality: N/A;   CORONARY STENT INTERVENTION N/A 03/17/2023   Procedure: CORONARY STENT INTERVENTION;  Surgeon: Corky Crafts, MD;  Location: [facility] INVASIVE CV LAB;  Service: Cardiovascular;  Laterality: N/A;   CORONARY ULTRASOUND/IVUS N/A 03/17/2023   Procedure: Coronary  Ultrasound/IVUS;  Surgeon: Corky Crafts, MD;  Location: [facility] INVASIVE CV LAB;  Service: Cardiovascular;  Laterality: N/A;   ESOPHAGOGASTRODUODENOSCOPY (EGD) WITH PROPOFOL N/A 05/02/2016   Procedure: ESOPHAGOGASTRODUODENOSCOPY (EGD) WITH PROPOFOL;  Surgeon: Charolett Bumpers, MD;  Location: [facility];  Service: Endoscopy;  Laterality: N/A;   LEFT HEART CATH AND CORONARY ANGIOGRAPHY N/A 03/15/2023   Procedure: LEFT HEART CATH AND CORONARY ANGIOGRAPHY;  Surgeon: Lennette Bihari, MD;  Location: [facility] INVASIVE CV LAB;  Service: Cardiovascular;  Laterality: N/A;   LEFT HEART CATH AND CORONARY ANGIOGRAPHY N/A 03/17/2023   Procedure: LEFT HEART CATH AND CORONARY ANGIOGRAPHY;  Surgeon: Corky Crafts, MD;  Location: [facility] INVASIVE CV LAB;  Service: Cardiovascular;  Laterality: N/A;   ROBOTIC ASSISTED TOTAL HYSTERECTOMY WITH SALPINGECTOMY Bilateral 05/08/2017   Procedure: ROBOTIC ASSISTED TOTAL HYSTERECTOMY WITH SALPINGECTOMY And Uterosacral Ligament Suspension;  Surgeon: Olivia Mackie, MD;  Location: [facility];  Service: Gynecology;  Laterality: Bilateral;   SIGMOIDOSCOPY  multiple--   TRANSANAL EXCISION OF RECTAL MASS  06-12-2003 and 05-24-2004   tubulovillious adenoma   TUMOR EXCISION N/A 07/05/2016   Procedure: TRANSANAL EXCISION OF TUBULLOVILLOUS ADENOMA OF RECTUM;  Surgeon: Avel Peace, MD;  Location: [facility];  Service: General;  Laterality: N/A;     Home Medications:  Prior to Admission medications   Medication Sig Start Date End Date Taking? Authorizing Provider  aspirin EC 81 MG tablet Take 1 tablet (81 mg total) by mouth daily. Swallow whole. 03/22/23   Swinyer, Zachary George, NP  atorvastatin (LIPITOR) 80 MG tablet Take 1 tablet (80 mg total) by mouth at bedtime. 04/17/23   Angela Constant, MD  cilostazol (PLETAL) 100 MG tablet Take 1 tablet (100 mg total) by mouth 2 (two) times daily. 12/26/23   Angela Constant, MD  famotidine-calcium carbonate-magnesium  hydroxide (PEPCID COMPLETE) 10-800-165 MG chewable tablet Chew 1 tablet by mouth as needed (heartburn).    [provider]  gabapentin (NEURONTIN) 300 MG capsule Take 1 capsule (300 mg total) by mouth at bedtime. 11/27/23   Vivi Barrack, DPM  losartan (COZAAR) 25 MG tablet Take 0.5 tablets (12.5 mg total) by mouth daily. 04/17/23   Chandrasekhar, Lafayette Dragon A, MD  metFORMIN (GLUCOPHAGE-XR) 500 MG 24 hr tablet Take 500 mg by mouth 2 (two) times daily.    [provider]  metoprolol tartrate (LOPRESSOR) 25 MG tablet Take 0.5 tablets (12.5 mg total) by mouth 2 (two) times daily. 04/17/23   Chandrasekhar, Rondel Jumbo, MD  nitroGLYCERIN (NITROSTAT) 0.4 MG SL tablet Place 1 tablet (0.4 mg total) under the tongue every 5 (five) minutes x 3 doses as needed for chest pain. 03/18/23   Pieter Partridge, MD  OMEPRAZOLE PO Take 1 tablet by mouth daily. Pt unsure of strength    [provider]    Inpatient Medications: Scheduled Meds:  Continuous Infusions:  PRN Meds:   Allergies:    Allergies  Allergen Reactions  Codeine Nausea And Vomiting   Coffee Bean Extract [Coffea Arabica] Other (See Comments)    Religious dietary preference: no coffee, chocolate OK   Other     All narcotics causes extreme vomitting   Lisinopril Cough   Pseudoephedrine Palpitations   Surgical Lubricant Itching and Rash    Surgical glue    Social History:   Social History   Socioeconomic History   Marital status: Married    Spouse name: Not on file   Number of children: Not on file   Years of education: Not on file   Highest education level: Not on file  Occupational History   Not on file  Tobacco Use   Smoking status: Never   Smokeless tobacco: Never  Vaping Use   Vaping status: Never Used  Substance and Sexual Activity   Alcohol use: No   Drug use: No   Sexual activity: Yes    Birth control/protection: Condom  Other Topics Concern   Not on file  Social History Narrative    Not on file   Social Drivers of Health   Financial Resource Strain: Not on file  Food Insecurity: Low Risk  (08/14/2023)   Received from [facility]   Hunger Vital Sign    Worried About Running Out of Food in the Last Year: Never true    Ran Out of Food in the Last Year: Never true  Transportation Needs: No Transportation Needs (08/14/2023)   Received from [facility]    In the past 12 months, has lack of reliable transportation kept you from medical appointments, meetings, work or from getting things needed for daily living? : No  Physical Activity: Not on file  Stress: Not on file  Social Connections: Not on file  Intimate Partner Violence: Not At Risk (03/15/2023)   Humiliation, Afraid, Rape, and Kick questionnaire    Fear of Current or Ex-Partner: No    Emotionally Abused: No    Physically Abused: No    Sexually Abused: No    Family History:    Family History  Problem Relation Age of Onset   Diabetes Mother    Cancer Mother    High blood pressure Mother    Cancer Father    Diabetes Sister      ROS:  Please see the history of present illness.   All other ROS reviewed and negative.     Physical Exam/Data:   Vitals:   01/01/24 0738 01/01/24 0830 01/01/24 0845 01/01/24 0900  BP: 129/74 130/70 134/80 126/64  Pulse: 72 64 65 68  Resp: 16 16 15 16   Temp: 98.5 F (36.9 C)     TempSrc:      SpO2: 100% 100% 100% 99%  Weight:  81.6 kg    Height:  5' 5.5" (1.664 m)     No intake or output data in the 24 hours ending 01/01/24 1200    01/01/2024    8:30 AM 01/01/2024    1:27 AM 12/26/2023    4:26 PM  Last 3 Weights  Weight (lbs) 180 lb 182 lb 3.2 oz 182 lb 3.2 oz  Weight (kg) 81.647 kg 82.645 kg 82.645 kg     Body mass index is 29.5 kg/m.  General:  Well nourished, well developed, in no acute distress HEENT: normal Neck: no JVD Vascular: No carotid bruits; Distal pulses 2+ bilaterally Cardiac:  normal S1, S2; RRR; no murmur  Lungs:   clear to auscultation bilaterally, no wheezing, rhonchi or  rales  Abd: soft, nontender, no hepatomegaly  Ext: no edema Musculoskeletal:  No deformities, BUE and BLE strength normal and equal Skin: warm and dry  Neuro:  CNs 2-12 intact, no focal abnormalities noted Psych:  Normal affect   EKG:  The EKG was personally reviewed and demonstrates:  SR HR 76 Telemetry:  Telemetry was personally reviewed and demonstrates:  sinus rhythm with HR 70s  Relevant CV Studies:  LHC 02/2023:   Mid RCA lesion is 90% stenosed.  A drug-eluting stent was successfully placed using a SYNERGY XD 3.50X20, postdilated to greater than 3.75 mm and optimized with intravascular ultrasound.   Post intervention, there is a 0% residual stenosis.   Mid LAD-1 lesion is 90% stenosed.  A drug-eluting stent was successfully placed using a SYNERGY XD 2.75X16, postdilated to greater than 3 mm and optimized with intravascular ultrasound.   Post intervention, there is a 0% residual stenosis.   Mid LAD-2 lesion is 20% stenosed.   2nd Diag lesion is 35% stenosed.  Jailed diagonal maintained TIMI-3 flow.   LV end diastolic pressure is low.   There is no aortic valve stenosis.   Successful two-vessel PCI of the RCA and LAD.  Continue dual antiplatelet therapy for 12 months.  If there are bleeding issues, could stop aspirin early.  Continue aggressive secondary prevention including diabetes control and lipid-lowering therapy.  Mynx closure device was deployed.  Of note, the patient reported severe right arm pain during her diagnostic cath 2 days ago.  I suspect she had severe right radial spasm.  That is why we used the femoral approach.  Laboratory Data:  High Sensitivity Troponin:   Recent Labs  Lab 01/01/24 0130 01/01/24 0416  TROPONINIHS 4 4     Chemistry Recent Labs  Lab 01/01/24 0130  NA 140  K 3.4*  CL 106  CO2 19*  GLUCOSE 135*  BUN 10  CREATININE 0.80  CALCIUM 11.0*  GFRNONAA >60  ANIONGAP 15    No  results for input(s): "PROT", "ALBUMIN", "AST", "ALT", "ALKPHOS", "BILITOT" in the last 168 hours. Lipids No results for input(s): "CHOL", "TRIG", "HDL", "LABVLDL", "LDLCALC", "CHOLHDL" in the last 168 hours.  Hematology Recent Labs  Lab 01/01/24 0130  WBC 8.8  RBC 4.70  HGB 14.1  HCT 41.7  MCV 88.7  MCH 30.0  MCHC 33.8  RDW 13.5  PLT 320   Thyroid No results for input(s): "TSH", "FREET4" in the last 168 hours.  BNPNo results for input(s): "BNP", "PROBNP" in the last 168 hours.  DDimer No results for input(s): "DDIMER" in the last 168 hours.   Radiology/Studies:  CT Head Wo Contrast Result Date: 01/01/2024 CLINICAL DATA:  Headache, new onset (Age >= 51y) EXAM: CT HEAD WITHOUT CONTRAST TECHNIQUE: Contiguous axial images were obtained from the base of the skull through the vertex without intravenous contrast. RADIATION DOSE REDUCTION: This exam was performed according to the departmental dose-optimization program which includes automated exposure control, adjustment of the mA and/or kV according to patient size and/or use of iterative reconstruction technique. COMPARISON:  None Available. FINDINGS: Brain: Normal anatomic configuration. No abnormal intra or extra-axial mass lesion or fluid collection. No abnormal mass effect or midline shift. No evidence of acute intracranial hemorrhage or infarct. Ventricular size is normal. Cerebellum unremarkable. Vascular: Unremarkable Skull: Intact Sinuses/Orbits: Mild mucosal thickening within the inferior maxillary sinuses bilaterally. No air-fluid levels. Remaining paranasal sinuses are clear. Orbits are unremarkable. Other: Mastoid air cells and middle ear cavities are clear. IMPRESSION: 1.  No acute intracranial abnormality. No calvarial fracture. 2. Mild bilateral maxillary sinus disease. Electronically Signed   By: Helyn Numbers M.D.   On: 01/01/2024 03:28   DG Chest 2 View Result Date: 01/01/2024 CLINICAL DATA:  Chest pain, left-sided neck pain.  History of MI with stents. EXAM: CHEST - 2 VIEW COMPARISON:  PA Lat chest 03/31/2023 FINDINGS: The heart size and mediastinal contours are within normal limits. Both lungs are clear. The visualized skeletal structures are intact, with thoracic spondylosis. IMPRESSION: No active cardiopulmonary disease.  Stable chest. Electronically Signed   By: Almira Bar M.D.   On: 01/01/2024 01:52     Assessment and Plan:   Chest pain - hs troponin x 2 negative - EKG does not appear ischemic - CP concerning for stuttering angina that has progressed - no heparin gtt in the setting of negative enzymes, continue to have chest discomfort - given known complex disease, will obtain definitive angiography - will stop pletal and plan to reinstate DAPT after heart cath - continue ASA - will continue 80 mg lipitor, and 12.5 mg toprol - hold losartan for now   Hypertension - continue BB, hold losartan for cath - BP in the room in the 140s systolic   Hyperlipidemia with LDL Goal < 70 03/15/2023: VLDL 40 05/24/2023: Cholesterol, Total 102; HDL 35; LDL Chol Calc (NIH) 46; Triglycerides 113 - continue 80 mg lipitor   DM - A1c 02/2023 was 6.8% - repeat A1c - SSI   Left leg pain - resting ABIs not impressive, but mild to moderate disease shown after exercise (L > R - sounds like she is not tolerating pletal - has been referred to [facility] - will stop pletal - she is tender on her left calf, consider ruling out DVT   Informed Consent   Shared Decision Making/Informed Consent The risks [stroke (1 in 1000), death (1 in 1000), kidney failure [usually temporary] (1 in 500), bleeding (1 in 200), allergic reaction [possibly serious] (1 in 200)], benefits (diagnostic support and management of coronary artery disease) and alternatives of a cardiac catheterization were discussed in detail with Ms. Goucher and she is willing to proceed.        Risk Assessment/Risk Scores:     TIMI Risk Score for Unstable Angina  or Non-ST Elevation MI:   The patient's TIMI risk score is 4, which indicates a 20% risk of all cause mortality, new or recurrent myocardial infarction or need for urgent revascularization in the next 14 days.        For questions or updates, please contact [facility] Please consult [email or web address] for contact info under    Signed, Marcelino Duster, PA  01/01/2024 12:00 PM  ATTENDING ATTESTATION  I have seen, examined and evaluated the patient this afternoon in the ER along with Micah Flesher, PA.  After reviewing all the available data and chart, we discussed the patients laboratory, study & physical findings as well as symptoms in detail.  I agree with her findings, examination as well as impression recommendations as per our discussion.    Attending adjustments noted in italics.   I have discussed the patient's symptoms with her and essentially heard pretty much the same story of different types of chest discomfort over the last several weeks and then the neck pain last night.  It seems like this was can be evaluated with a Myoview ordered by her primary cardiologist, but now with persistent symptoms exacerbating to the point of her coming to  the ER I think it is reasonable to have a more definitive assessment with cardiac catheterization.  I suspect that her symptoms are probably not related to ACS, but are concerning after her and she does have significant cardiac history.  I am concerned that a noninvasive modality would not provide definitive enough information for her to feel comfortable knowing that this is not her heart.  Not convinced that her leg pain is claudication, but would defer to vascular surgery who she is seeing soon.  I think it is reasonable to stop Pletal until she is seen by Vascular, and go back to Brilinta.  Agree with the plan for cardiac catheterization.  This will allow [place] to know better how to continue to manage.     Marykay Lex, MD,  MS Bryan Lemma, M.D., M.S. Interventional Cardiologist  [facility]  Pager # [phone] Phone # [phone] [address] [place], [place] [address]

## 2024-01-01 NOTE — ED Triage Notes (Signed)
 Pt in with 10/10 chest and L neck pain that woke her up an hr ago, hx of MI with stents placed last May. Pt also states she has had a HA all day, and it has not gone away. Recently, pt saw her cardiologist 3/4 and was taken off Brilinta and switched to Cilostazol. Anxious, clenching chest during triage

## 2024-01-02 ENCOUNTER — Encounter (HOSPITAL_COMMUNITY): Payer: Self-pay | Admitting: Internal Medicine

## 2024-01-02 ENCOUNTER — Other Ambulatory Visit (HOSPITAL_COMMUNITY): Payer: Self-pay

## 2024-01-02 ENCOUNTER — Telehealth (HOSPITAL_COMMUNITY): Payer: Self-pay | Admitting: Pharmacy Technician

## 2024-01-02 ENCOUNTER — Other Ambulatory Visit: Payer: Self-pay | Admitting: Podiatry

## 2024-01-02 DIAGNOSIS — R072 Precordial pain: Secondary | ICD-10-CM

## 2024-01-02 DIAGNOSIS — I10 Essential (primary) hypertension: Secondary | ICD-10-CM | POA: Diagnosis not present

## 2024-01-02 DIAGNOSIS — E785 Hyperlipidemia, unspecified: Secondary | ICD-10-CM | POA: Diagnosis not present

## 2024-01-02 DIAGNOSIS — M722 Plantar fascial fibromatosis: Secondary | ICD-10-CM

## 2024-01-02 DIAGNOSIS — E782 Mixed hyperlipidemia: Secondary | ICD-10-CM | POA: Diagnosis not present

## 2024-01-02 LAB — CBC
HCT: 41.7 % (ref 36.0–46.0)
Hemoglobin: 14.1 g/dL (ref 12.0–15.0)
MCH: 29.5 pg (ref 26.0–34.0)
MCHC: 33.8 g/dL (ref 30.0–36.0)
MCV: 87.2 fL (ref 80.0–100.0)
Platelets: 314 10*3/uL (ref 150–400)
RBC: 4.78 MIL/uL (ref 3.87–5.11)
RDW: 13.6 % (ref 11.5–15.5)
WBC: 6.9 10*3/uL (ref 4.0–10.5)
nRBC: 0 % (ref 0.0–0.2)

## 2024-01-02 LAB — BASIC METABOLIC PANEL
Anion gap: 5 (ref 5–15)
BUN: 8 mg/dL (ref 6–20)
CO2: 22 mmol/L (ref 22–32)
Calcium: 9.8 mg/dL (ref 8.9–10.3)
Chloride: 111 mmol/L (ref 98–111)
Creatinine, Ser: 0.71 mg/dL (ref 0.44–1.00)
GFR, Estimated: >60 mL/min (ref >60)
Glucose, Bld: 118 mg/dL — ABNORMAL HIGH (ref 70–99)
Potassium: 3.8 mmol/L (ref 3.5–5.1)
Sodium: 138 mmol/L (ref 135–145)

## 2024-01-02 MED ORDER — METFORMIN HCL ER 500 MG PO TB24: 500.0000 mg | ORAL_TABLET | Freq: Two times a day (BID) | ORAL | Status: AC

## 2024-01-02 MED ORDER — AMLODIPINE BESYLATE 2.5 MG PO TABS
2.5000 mg | ORAL_TABLET | Freq: Every day | ORAL | 0 refills | Status: DC
  Filled 2024-01-02: qty 30, 30d supply, fill #0

## 2024-01-02 MED ORDER — TICAGRELOR 90 MG PO TABS
90.0000 mg | ORAL_TABLET | Freq: Two times a day (BID) | ORAL | 0 refills | Status: AC
Start: 2024-01-02 — End: 2024-02-01
  Filled 2024-01-02: qty 60, 30d supply, fill #0

## 2024-01-02 NOTE — Hospital Course (Signed)
 53 y.o. female with medical history significant of hypertension, CAD s/p DES to RCA and LAD in 02/2023, peripheral artery disease, and diabetes mellitus type 2 who presents with complaints of chest pain.She has a history of a myocardial infarction one year ago. She monitors her blood pressure at home and noted an increase from 140/80 to 160/80 during the recent episode. She has type 2 diabetes managed with metformin, which she took last night but has not taken today. She has lost approximately 20 pounds since her heart attack, with some fluctuation during the holidays. Upon admission into the emergency department patient was noted to be pulse 58-79, and all other vital signs relatively maintained.  Labs significant for high-sensitivity troponin troponin negative x 2, potassium 3.4, and calcium 11.  Chest x-ray showed no acute abnormality CT scan of the head did not note any acute abnormality.  Patient had been given aspirin, acetaminophen 1000 mg p.o., potassium chloride 40 mEq p.o, and nitroglycerin paste-chest pain Patient was seen by cardiology> underwent left heart cath 3/10 night> widely patent LAD and RCA stents with mild disease noted in the D2 and mild LAD, normal LVEF, advised to resume Brilinta in place of cilostazol with plans for dual antiplatelet therapy with aspirin and Brilinta for 1 year since PCI 02/2023 continue aggressive secondary prevention of CAD advised to switch losartan to amlodipine 2.5 for treatment of possible coronary vasospasm and meds as listed Patient ambulated did well overnight no chest pain seen by cardiology and cleared for discharge home

## 2024-01-02 NOTE — Progress Notes (Signed)
 Reviewed AVS, patient expressed understanding of medications, MD follow up reviewed.   Removed IV, Site clean, dry and intact.  Patient states all belongings brought to the hospital at time of admission are accounted for and packed to take home.  Picked up medications from [facility] pharmacy. Pt transported to entrance A where family member was waiting in vehicle to transport home.

## 2024-01-02 NOTE — Discharge Summary (Signed)
 Physician Discharge Summary  Angela Knight MVH:[id number] DOB: May 18, 1971 DOA: 01/01/2024  PCP: Delorse Lek, MD  Admit date: 01/01/2024 Discharge date: 01/02/2024 Recommendations for Outpatient Follow-up:  Follow up with PCP in 1 weeks-call for appointment Please obtain BMP/CBC in one week  Discharge Dispo: Home Discharge Condition: Stable Code Status:   Code Status: Full Code Diet recommendation:  Diet Order             Diet Heart Room service appropriate? Yes; Fluid consistency: Thin  Diet effective now                    Brief/Interim Summary:  53 y.o. female with medical history significant of hypertension, CAD s/p DES to RCA and LAD in 02/2023, peripheral artery disease, and diabetes mellitus type 2 who presents with complaints of chest pain.She has a history of a myocardial infarction one year ago. She monitors her blood pressure at home and noted an increase from 140/80 to 160/80 during the recent episode. She has type 2 diabetes managed with metformin, which she took last night but has not taken today. She has lost approximately 20 pounds since her heart attack, with some fluctuation during the holidays. Upon admission into the emergency department patient was noted to be pulse 58-79, and all other vital signs relatively maintained.  Labs significant for high-sensitivity troponin troponin negative x 2, potassium 3.4, and calcium 11.  Chest x-ray showed no acute abnormality CT scan of the head did not note any acute abnormality.  Patient had been given aspirin, acetaminophen 1000 mg p.o., potassium chloride 40 mEq p.o, and nitroglycerin paste-chest pain Patient was seen by cardiology> underwent left heart cath 3/10 night> widely patent LAD and RCA stents with mild disease noted in the D2 and mild LAD, normal LVEF, advised to resume Brilinta in place of cilostazol with plans for dual antiplatelet therapy with aspirin and Brilinta for 1 year since PCI 02/2023 continue aggressive  secondary prevention of CAD advised to switch losartan to amlodipine 2.5 for treatment of possible coronary vasospasm and meds as listed Patient ambulated did well overnight no chest pain seen by cardiology and cleared for discharge home   Discharge Diagnoses:  Principal Problem:   Chest pain Active Problems:   CAD S/P percutaneous coronary angioplasty   Hypertension   Type 2 diabetes mellitus (HCC)   Hypercalcemia   Hyperlipidemia   Left leg pain   PVD (peripheral vascular disease) (HCC)   GERD (gastroesophageal reflux disease)  Chest pain atypical question coronary vasospasm CAD recently stent may: Underwent cardiac cath unremarkable, advised to continue Brilinta aspirin x 12 months since last cath in May, S/P amlodipine to see treat for possible basis spasm and NT enzyme benefit.  BP well-controlled continue current meds continue Lipitor discharge home this morning HTN: Controlled continue metoprolol and amlodipine HLD: Continue atorvastatin Type 2 diabetes mellitus controlled: Resume metformin for 8 hours postprocedure .  A1c stable at 6.4. PVD/left leg pain: Stable meds adjusted as above continue statin GERD: Continue PPI Hypercalcium 11.1 on admission this morning 9.8, likely volume depletion/hemoconcentration related.  Consults: Cardiology Subjective: Alert awake oriented chest pain, eager to go home  Discharge Exam: Vitals:   01/02/24 0443 01/02/24 0838  BP: 119/66 112/68  Pulse: (!) 55 65  Resp: 16 16  Temp: 98.4 F (36.9 C) 98.4 F (36.9 C)  SpO2: 97% 95%   General: Pt is alert, awake, not in acute distress Cardiovascular: RRR, S1/S2 +, no rubs, no gallops Respiratory:  CTA bilaterally, no wheezing, no rhonchi Abdominal: Soft, NT, ND, bowel sounds + Extremities: no edema, no cyanosis  Discharge Instructions  Discharge Instructions     Discharge instructions   Complete by: As directed    Please call call MD or return to ER for similar or worsening  recurring problem that brought you to hospital or if any fever,nausea/vomiting,abdominal pain, uncontrolled pain, chest pain,  shortness of breath or any other alarming symptoms.  Please follow-up your doctor as instructed in a week time and call the office for appointment.  Please avoid alcohol, smoking, or any other illicit substance and maintain healthy habits including taking your regular medications as prescribed.  You were cared for by a hospitalist during your hospital stay. If you have any questions about your discharge medications or the care you received while you were in the hospital after you are discharged, you can call the unit and ask to speak with the hospitalist on call if the hospitalist that took care of you is not available.  Once you are discharged, your primary care physician will handle any further medical issues. Please note that NO REFILLS for any discharge medications will be authorized once you are discharged, as it is imperative that you return to your primary care physician (or establish a relationship with a primary care physician if you do not have one) for your aftercare needs so that they can reassess your need for medications and monitor your lab values   Increase activity slowly   Complete by: As directed       Allergies as of 01/02/2024       Reactions   Codeine Nausea And Vomiting   Coffee Bean Extract [coffea Arabica] Other (See Comments)   Religious dietary preference: no coffee, chocolate OK   Other    All narcotics causes extreme vomitting   Lisinopril Cough   Pseudoephedrine Palpitations   Surgical Lubricant Itching, Rash   Surgical glue        Medication List     STOP taking these medications    losartan 25 MG tablet Commonly known as: COZAAR       TAKE these medications    amLODipine 2.5 MG tablet Commonly known as: NORVASC Take 1 tablet (2.5 mg total) by mouth daily. Start taking on: January 03, 2024   aspirin EC 81 MG  tablet Take 1 tablet (81 mg total) by mouth daily. Swallow whole.   atorvastatin 80 MG tablet Commonly known as: LIPITOR Take 1 tablet (80 mg total) by mouth at bedtime.   famotidine-calcium carbonate-magnesium hydroxide 10-800-165 MG chewable tablet Commonly known as: PEPCID COMPLETE Chew 1 tablet by mouth as needed (heartburn).   gabapentin 300 MG capsule Commonly known as: NEURONTIN Take 1 capsule (300 mg total) by mouth at bedtime.   metFORMIN 500 MG 24 hr tablet Commonly known as: GLUCOPHAGE-XR Take 1 tablet (500 mg total) by mouth 2 (two) times daily. Start taking on: January 04, 2024 What changed: These instructions start on January 04, 2024. If you are unsure what to do until then, ask your doctor or other care provider.   metoprolol tartrate 25 MG tablet Commonly known as: LOPRESSOR Take 0.5 tablets (12.5 mg total) by mouth 2 (two) times daily.   nitroGLYCERIN 0.4 MG SL tablet Commonly known as: NITROSTAT Place 1 tablet (0.4 mg total) under the tongue every 5 (five) minutes x 3 doses as needed for chest pain.   OMEPRAZOLE PO Take 1 tablet by mouth daily. Pt unsure  of strength   ticagrelor 90 MG Tabs tablet Commonly known as: BRILINTA Take 1 tablet (90 mg total) by mouth 2 (two) times daily.        Follow-up Information     Delorse Lek, MD Follow up in 1 week(s).   Specialty: Family Medicine Contact information: [address] [place] [address] [phone]                Allergies  Allergen Reactions   Codeine Nausea And Vomiting   Coffee Bean Extract [Coffea Arabica] Other (See Comments)    Religious dietary preference: no coffee, chocolate OK   Other     All narcotics causes extreme vomitting   Lisinopril Cough   Pseudoephedrine Palpitations   Surgical Lubricant Itching and Rash    Surgical glue    The results of significant diagnostics from this hospitalization (including imaging, microbiology, ancillary and  laboratory) are listed below for reference.    Microbiology: No results found for this or any previous visit (from the past 240 hours).  Procedures/Studies: CARDIAC CATHETERIZATION Result Date: 01/01/2024 Conclusions: Widely patent LAD and RCA stents with mild disease noted in D2 and mid LAD, similar to conclusion of last catheterization/PCI in 02/2023. Normal left ventricular systolic function (LVE 55-65%) with mildly elevated filling pressure (LVEDP 20 mmHg). Recommendations: Resume ticagrelor in place of cilostazol with plans for dual antiplatelet therapy with aspirin and ticagrelor for 1 year from time of PCI in 02/2023. Continue aggressive secondary prevention of coronary artery disease. Switch losartan to amlodipine 2.5 mg daily for treatment of possible component of coronary vasospasm. Yvonne Kendall, MD [facility]  CT Head Wo Contrast Result Date: 01/01/2024 CLINICAL DATA:  Headache, new onset (Age >= 51y) EXAM: CT HEAD WITHOUT CONTRAST TECHNIQUE: Contiguous axial images were obtained from the base of the skull through the vertex without intravenous contrast. RADIATION DOSE REDUCTION: This exam was performed according to the departmental dose-optimization program which includes automated exposure control, adjustment of the mA and/or kV according to patient size and/or use of iterative reconstruction technique. COMPARISON:  None Available. FINDINGS: Brain: Normal anatomic configuration. No abnormal intra or extra-axial mass lesion or fluid collection. No abnormal mass effect or midline shift. No evidence of acute intracranial hemorrhage or infarct. Ventricular size is normal. Cerebellum unremarkable. Vascular: Unremarkable Skull: Intact Sinuses/Orbits: Mild mucosal thickening within the inferior maxillary sinuses bilaterally. No air-fluid levels. Remaining paranasal sinuses are clear. Orbits are unremarkable. Other: Mastoid air cells and middle ear cavities are clear. IMPRESSION: 1. No acute  intracranial abnormality. No calvarial fracture. 2. Mild bilateral maxillary sinus disease. Electronically Signed   By: Helyn Numbers M.D.   On: 01/01/2024 03:28   DG Chest 2 View Result Date: 01/01/2024 CLINICAL DATA:  Chest pain, left-sided neck pain. History of MI with stents. EXAM: CHEST - 2 VIEW COMPARISON:  PA Lat chest 03/31/2023 FINDINGS: The heart size and mediastinal contours are within normal limits. Both lungs are clear. The visualized skeletal structures are intact, with thoracic spondylosis. IMPRESSION: No active cardiopulmonary disease.  Stable chest. Electronically Signed   By: Almira Bar M.D.   On: 01/01/2024 01:52   MR ANKLE LEFT WO CONTRAST Result Date: 12/25/2023 CLINICAL DATA:  Left ankle pain. EXAM: MRI OF THE LEFT ANKLE WITHOUT CONTRAST TECHNIQUE: Multiplanar, multisequence MR imaging of the ankle was performed. No intravenous contrast was administered. COMPARISON:  None Available. FINDINGS: TENDONS Peroneal: Peroneal longus tendon intact. Peroneal brevis intact. Posteromedial: Posterior tibial tendon intact. Flexor  hallucis longus tendon intact. Flexor digitorum longus tendon intact. Anterior: Tibialis anterior tendon intact. Extensor hallucis longus tendon intact Extensor digitorum longus tendon intact. Achilles:  Mild distal Achilles tendinosis without a tear. Plantar Fascia: Mild plantar fasciitis of the medial band of the plantar fascia towards the calcaneal insertion with perifascial edema and mild subcortical marrow edema. LIGAMENTS Lateral: Anterior talofibular ligament intact. Calcaneofibular ligament intact. Posterior talofibular ligament intact. Anterior and posterior tibiofibular ligaments intact. Medial: Deltoid ligament intact. Spring ligament intact. CARTILAGE Ankle Joint: No joint effusion. Normal ankle mortise. No chondral defect. Subtalar Joints/Sinus Tarsi: Normal subtalar joints. No subtalar joint effusion. Normal sinus tarsi. Bones: No aggressive osseous lesion.  No fracture or dislocation. Mild partial-thickness cartilage loss of the dorsal aspect of the navicular with subchondral marrow edema at the articulation of the talonavicular joint. Soft Tissue: No fluid collection or hematoma. Muscles are normal without edema or atrophy. Tarsal tunnel is normal. IMPRESSION: 1. Mild plantar fasciitis of the medial band of the plantar fascia towards the calcaneal insertion with perifascial edema and mild subcortical marrow edema. 2. Mild distal Achilles tendinosis without a tear. 3. Mild partial-thickness cartilage loss of the dorsal aspect of the navicular with subchondral marrow edema at the articulation of the talonavicular joint. Electronically Signed   By: Elige Ko M.D.   On: 12/25/2023 11:50    Labs: BNP (last 3 results) No results for input(s): "BNP" in the last 8760 hours. Basic Metabolic Panel: Recent Labs  Lab 01/01/24 0130 01/02/24 0350  NA 140 138  K 3.4* 3.8  CL 106 111  CO2 19* 22  GLUCOSE 135* 118*  BUN 10 8  CREATININE 0.80 0.71  CALCIUM 11.0* 9.8   Liver Function Tests: No results for input(s): "AST", "ALT", "ALKPHOS", "BILITOT", "PROT", "ALBUMIN" in the last 168 hours. No results for input(s): "LIPASE", "AMYLASE" in the last 168 hours. No results for input(s): "AMMONIA" in the last 168 hours. CBC: Recent Labs  Lab 01/01/24 0130 01/01/24 2120 01/02/24 0350  WBC 8.8 7.7 6.9  HGB 14.1 14.1 14.1  HCT 41.7 39.7 41.7  MCV 88.7 85.0 87.2  PLT 320 298 314   Cardiac Enzymes: No results for input(s): "CKTOTAL", "CKMB", "CKMBINDEX", "TROPONINI" in the last 168 hours. BNP: Invalid input(s): "POCBNP" CBG: Recent Labs  Lab 01/01/24 1623 01/01/24 1809  GLUCAP 85 91   D-Dimer No results for input(s): "DDIMER" in the last 72 hours. Hgb A1c Recent Labs    01/01/24 0130  HGBA1C 6.4*   Lipid Profile Recent Labs    01/01/24 0130  CHOL 119  HDL 57  LDLCALC 44  TRIG 92  CHOLHDL 2.1   Thyroid function studies No results  for input(s): "TSH", "T4TOTAL", "T3FREE", "THYROIDAB" in the last 72 hours.  Invalid input(s): "FREET3" Anemia work up No results for input(s): "VITAMINB12", "FOLATE", "FERRITIN", "TIBC", "IRON", "RETICCTPCT" in the last 72 hours. Urinalysis    Component Value Date/Time   COLORURINE YELLOW 11/04/2021 2131   APPEARANCEUR CLEAR 11/04/2021 2131   LABSPEC 1.023 11/04/2021 2131   PHURINE 5.0 11/04/2021 2131   GLUCOSEU >=500 (A) 11/04/2021 2131   HGBUR NEGATIVE 11/04/2021 2131   BILIRUBINUR NEGATIVE 11/04/2021 2131   KETONESUR 5 (A) 11/04/2021 2131   PROTEINUR NEGATIVE 11/04/2021 2131   NITRITE NEGATIVE 11/04/2021 2131   LEUKOCYTESUR NEGATIVE 11/04/2021 2131   Sepsis Labs Recent Labs  Lab 01/01/24 0130 01/01/24 2120 01/02/24 0350  WBC 8.8 7.7 6.9   Microbiology No results found for this or any previous visit (from  the past 240 hours).   Time coordinating discharge: 25 minutes  SIGNED: Lanae Boast, MD  [facility] 01/02/2024, 10:30 AM  If 7PM-7AM, please contact night-coverage [email or web address]

## 2024-01-02 NOTE — Progress Notes (Addendum)
 Patient Name: Angela Knight Date of Encounter: 01/02/2024 [facility] Cardiologist: Christell Constant, MD    Patient Profile:    Angela Knight is a 53 y.o. female with a hx of CAD, hypertension, and DM2 who is being seen 01/01/2024 for the evaluation of chest pain at the request of Dr. Durwin Nora.  Interval Summary  .    Feeling better today.  "Suffered all night long from procedural pain because she was not sedated having asked for no sedation, she realized that maybe she would have like to have had some pain medications..  She acknowledged that fentanyl did work well last night.  Vital Signs .    Vitals:   01/01/24 2100 01/01/24 2126 01/02/24 0443 01/02/24 0838  BP:   119/66 112/68  Pulse: 60 62 (!) 55 65  Resp:  20 16 16   Temp:   98.4 F (36.9 C) 98.4 F (36.9 C)  TempSrc:   Oral Oral  SpO2:   97% 95%  Weight:      Height:        Intake/Output Summary (Last 24 hours) at 01/02/2024 1005 Last data filed at 01/02/2024 0430 Gross per 24 hour  Intake 200 ml  Output 775 ml  Net -575 ml      01/01/2024    8:30 AM 01/01/2024    1:27 AM 12/26/2023    4:26 PM  Last 3 Weights  Weight (lbs) 180 lb 182 lb 3.2 oz 182 lb 3.2 oz  Weight (kg) 81.647 kg 82.645 kg 82.645 kg      Telemetry/ECG/ Cardiac Studies    NSR - Personally Reviewed  Cardiac Cath 01/01/2024 Conclusions: Widely patent LAD and RCA stents with mild disease noted in D2 and mid LAD, similar to conclusion of last catheterization/PCI in 02/2023. Normal left ventricular systolic function (LVE 55-65%) with mildly elevated filling pressure (LVEDP 20 mmHg).  Dominance: Right   Recommendations: Resume ticagrelor in place of cilostazol with plans for dual antiplatelet therapy with aspirin and ticagrelor for 1 year from time of PCI in 02/2023. Continue aggressive secondary prevention of coronary artery disease. Switch losartan to amlodipine 2.5 mg daily for treatment of possible component of coronary  vasospasm.   Physical Exam .   GEN: No acute distress.   Neck: No JVD Cardiac: RRR, Normal S1 S2. no murmurs, rubs, or gallops.  Respiratory: Clear to auscultation bilaterally. GI: Soft, nontender, non-distended  MS: No edema; right groin access site no hematoma or bruit.  No swelling.  Apparently she had tenderness and pain in the right pelvic area all night long but feels better now.  Assessment & Plan .     Chest Pain with known history of CAD and PCI- ruled out for MI with neg Troponins & normal EKG, Cath with widely patent stents.  No clear cardiac etiology noted, however cannot exclude vasospasm. Have converted losartan to amlodipine for visualization, continue blood pressure Agree with stopping Pletal and reinstating DAPT: Aspirin 81 mg and Brilinta 90 mg twice daily Continue beta-blocker, Lipitor at home dose Need to consider noncardiac etiology of chest pain, could very well be stress related or musculoskeletal, however we will try amlodipine for potential antianginal benefit. If she were to have another heart catheterization, would recommend use of Fentanyl for pain relief  Hypertension, well-controlled on current medications.  Was on low-dose losartan converted to low-dose amlodipine 2.5 mg daily along with Lopressor 12.5 mg twice daily Hyperlipidemia: Continue atorvastatin 80 mg daily.   Stable  for discharge from a car standpoint.  We have canceled her stress test. Will work to arrange outpatient follow-up with Dr. Raynelle Jan which ostensibly could have been scheduled after ordering stress test    For questions or updates, please contact [facility] Please consult [email or web address] for contact info under        Signed, Bryan Lemma, MD

## 2024-01-02 NOTE — Telephone Encounter (Signed)
Patient Product/process development scientist completed.    The patient is insured through [employer] [facility]. Patient has [employer], may use a copay card, and/or apply for patient assistance if available.    Ran test claim for Farxiga 10 mg and the current 30 day co-pay is $30.00.  Ran test claim for Jardiance 10 mg and the current 30 day co-pay is $30.00.   This test claim was processed through [facility]- copay amounts may vary at other pharmacies due to pharmacy/plan contracts, or as the patient moves through the different stages of their insurance plan.     Roland Earl, CPHT Pharmacy Technician III Certified Patient Advocate [facility] Patient Advocate Team Direct Number: [phone]  Fax: [phone]

## 2024-01-02 NOTE — Progress Notes (Signed)
 Mobility Specialist Progress Note:    01/02/24 1025  Mobility  Activity Ambulated independently in hallway  Level of Assistance Independent  Assistive Device None  Distance Ambulated (ft) 470 ft  Activity Response Tolerated well  Mobility Referral Yes  Mobility visit 1 Mobility  Mobility Specialist Start Time (ACUTE ONLY) 1018  Mobility Specialist Stop Time (ACUTE ONLY) 1024  Mobility Specialist Time Calculation (min) (ACUTE ONLY) 6 min   Pt received in bed, agreeable to mobility session. Ambulated in hallway independently with no AD. Tolerated well, asx throughout. HR 71 bpm. Returned pt to room, left with all needs met, husband in room.   Angela Knight Mobility Specialist Please contact via Special educational needs teacher or  Rehab office at [phone]

## 2024-01-03 LAB — PARATHYROID HORMONE, INTACT (NO CA): PTH: 32 pg/mL (ref 15–65)

## 2024-01-08 ENCOUNTER — Encounter (HOSPITAL_COMMUNITY)

## 2024-01-10 ENCOUNTER — Ambulatory Visit: Payer: 59 | Admitting: Vascular Surgery

## 2024-01-10 ENCOUNTER — Encounter: Payer: Self-pay | Admitting: Vascular Surgery

## 2024-01-10 VITALS — BP 107/68 | HR 58 | Temp 98.5°F | Ht 65.5 in | Wt 182.0 lb

## 2024-01-10 DIAGNOSIS — I739 Peripheral vascular disease, unspecified: Secondary | ICD-10-CM | POA: Diagnosis not present

## 2024-01-10 NOTE — Progress Notes (Signed)
 Patient ID: Angela Knight, female   DOB: Jul 21, 1971, 53 y.o.   MRN: [id number]  Reason for Consult: New Patient (Initial Visit)   Referred by Delorse Lek, MD  Subjective:     HPI:  Angela Knight is a 53 y.o. female with history of coronary artery stenting and recently underwent repeat coronary angiography with concern for vasospasm.  She has left leg pain which occurs at rest and is mostly cramping from the left knee to the groin.  After recent medications which was concern for vasospasm she states this is not occurred.  She does not have frank claudication denies any tissue loss or ulceration of her lower extremities and has never had any lower extremity injuries or vascular interventions.  She is on aspirin, Brilinta and a statin due to history of coronary artery stenting.  She was recently switched to calcium channel blocker from lisinopril for concern of vasospasm in her coronary arteries.  Past Medical History:  Diagnosis Date   Car sickness    Complication of anesthesia    severe   Family history of adverse reaction to anesthesia    PONV   GERD (gastroesophageal reflux disease)    occ. OTC med used   History of esophageal stricture    stenosis dilatation 05-02-2016   History of gastric polyp    benign 05-02-2016   PONV (postoperative nausea and vomiting)    SVD (spontaneous vaginal delivery)    x 6   Tubulovillous adenoma of rectum    multiple recurrent's   Wears glasses    Family History  Problem Relation Age of Onset   Diabetes Mother    Cancer Mother    High blood pressure Mother    Cancer Father    Diabetes Sister    Past Surgical History:  Procedure Laterality Date   ANTERIOR AND POSTERIOR REPAIR N/A 05/08/2017   Procedure: ANTERIOR Colporrhaphy (CYSTOCELE) AND POSTERIOR REPAIR (RECTOCELE), PERINEALORRHAPHY;  Surgeon: Olivia Mackie, MD;  Location: [facility];  Service: Gynecology;  Laterality: N/A;   COLONOSCOPY WITH PROPOFOL N/A 05/02/2016   Procedure:  COLONOSCOPY WITH PROPOFOL;  Surgeon: Charolett Bumpers, MD;  Location: [facility];  Service: Endoscopy;  Laterality: N/A;   CORONARY STENT INTERVENTION N/A 03/17/2023   Procedure: CORONARY STENT INTERVENTION;  Surgeon: Corky Crafts, MD;  Location: [facility] INVASIVE CV LAB;  Service: Cardiovascular;  Laterality: N/A;   CORONARY ULTRASOUND/IVUS N/A 03/17/2023   Procedure: Coronary Ultrasound/IVUS;  Surgeon: Corky Crafts, MD;  Location: [facility] INVASIVE CV LAB;  Service: Cardiovascular;  Laterality: N/A;   ESOPHAGOGASTRODUODENOSCOPY (EGD) WITH PROPOFOL N/A 05/02/2016   Procedure: ESOPHAGOGASTRODUODENOSCOPY (EGD) WITH PROPOFOL;  Surgeon: Charolett Bumpers, MD;  Location: [facility];  Service: Endoscopy;  Laterality: N/A;   LEFT HEART CATH AND CORONARY ANGIOGRAPHY N/A 03/15/2023   Procedure: LEFT HEART CATH AND CORONARY ANGIOGRAPHY;  Surgeon: Lennette Bihari, MD;  Location: [facility] INVAS[facility] CV LAB;  Service: Cardiovascular;  Laterality: N/A;   LEFT HEART CATH AND CORONARY ANGIOGRAPHY N/A 03/17/2023   Procedure: LEFT HEART CATH AND CORONARY ANGIOGRAPHY;  Surgeon: Corky Crafts, MD;  Location: [facility] INVASIVE CV LAB;  Service: Cardiovascular;  Laterality: N/A;   LEFT HEART CATH AND CORONARY ANGIOGRAPHY N/A 01/01/2024   Procedure: LEFT HEART CATH AND CORONARY ANGIOGRAPHY;  Surgeon: Yvonne Kendall, MD;  Location: [facility]AS[facility] CV LAB;  Service: Cardiovascular;  Laterality: N/A;   ROBOTIC ASSISTED TOTAL HYSTERECTOMY WITH SALPINGECTOMY Bilateral 05/08/2017   Procedure: ROBOTIC ASSISTED TOTAL HYSTERECTOMY WITH SALPINGECTOMY And  Uterosacral Ligament Suspension;  Surgeon: Olivia Mackie, MD;  Location: [facility];  Service: Gynecology;  Laterality: Bilateral;   SIGMOIDOSCOPY  multiple--   TRANSANAL EXCISION OF RECTAL MASS  06-12-2003 and 05-24-2004   tubulovillious adenoma   TUMOR EXCISION N/A 07/05/2016   Procedure: TRANSANAL EXCISION OF TUBULLOVILLOUS ADENOMA OF RECTUM;  Surgeon: Avel Peace, MD;  Location:  [facility];  Service: General;  Laterality: N/A;    Short Social History:  Social History   Tobacco Use   Smoking status: Never   Smokeless tobacco: Never  Substance Use Topics   Alcohol use: No    Allergies  Allergen Reactions   Codeine Nausea And Vomiting   Coffee Bean Extract [Coffea Arabica] Other (See Comments)    Religious dietary preference: no coffee, chocolate OK   Other     All narcotics causes extreme vomitting   Lisinopril Cough   Pseudoephedrine Palpitations   Surgical Lubricant Itching and Rash    Surgical glue    Current Outpatient Medications  Medication Sig Dispense Refill   amLODipine (NORVASC) 2.5 MG tablet Take 1 tablet (2.5 mg total) by mouth daily. 30 tablet 0   aspirin EC 81 MG tablet Take 1 tablet (81 mg total) by mouth daily. Swallow whole. 90 tablet 3   atorvastatin (LIPITOR) 80 MG tablet Take 1 tablet (80 mg total) by mouth at bedtime. 90 tablet 3   famotidine-calcium carbonate-magnesium hydroxide (PEPCID COMPLETE) 10-800-165 MG chewable tablet Chew 1 tablet by mouth as needed (heartburn).     gabapentin (NEURONTIN) 100 MG capsule TAKE 1 CAPSULE BY MOUTH AT BEDTIME. 30 capsule 0   gabapentin (NEURONTIN) 300 MG capsule Take 1 capsule (300 mg total) by mouth at bedtime. 30 capsule 2   metFORMIN (GLUCOPHAGE-XR) 500 MG 24 hr tablet Take 1 tablet (500 mg total) by mouth 2 (two) times daily.     metoprolol tartrate (LOPRESSOR) 25 MG tablet Take 0.5 tablets (12.5 mg total) by mouth 2 (two) times daily. 90 tablet 3   nitroGLYCERIN (NITROSTAT) 0.4 MG SL tablet Place 1 tablet (0.4 mg total) under the tongue every 5 (five) minutes x 3 doses as needed for chest pain. 30 tablet 0   OMEPRAZOLE PO Take 1 tablet by mouth daily. Pt unsure of strength     ticagrelor (BRILINTA) 90 MG TABS tablet Take 1 tablet (90 mg total) by mouth 2 (two) times daily. 60 tablet 0   No current facility-administered medications for this visit.    Review of Systems   Constitutional:  Constitutional negative. HENT: HENT negative.  Eyes: Eyes negative.  Cardiovascular: Positive for chest tightness.  GI: Gastrointestinal negative.  Musculoskeletal:       Left leg cramping Skin: Skin negative.  Neurological: Neurological negative. Hematologic: Hematologic/lymphatic negative.  Psychiatric: Psychiatric negative.        Objective:  Objective   Vitals:   01/10/24 1506  BP: 107/68  Pulse: (!) 58  Temp: 98.5 F (36.9 C)  SpO2: 97%  Weight: 182 lb (82.6 kg)  Height: 5' 5.5" (1.664 m)   Body mass index is 29.83 kg/m.  Physical Exam HENT:     Head: Normocephalic.     Nose: Nose normal.  Eyes:     Pupils: Pupils are equal, round, and reactive to light.  Cardiovascular:     Rate and Rhythm: Normal rate.     Pulses:          Popliteal pulses are 2+ on the right side and 2+ on the  left side.       Posterior tibial pulses are 2+ on the right side and 2+ on the left side.  Pulmonary:     Effort: Pulmonary effort is normal.  Abdominal:     General: Abdomen is flat.  Skin:    General: Skin is warm.     Capillary Refill: Capillary refill takes less than 2 seconds.  Neurological:     General: No focal deficit present.     Mental Status: She is alert.  Psychiatric:        Mood and Affect: Mood normal.     Data: EXAM: NONINVASIVE PHYSIOLOGIC VASCULAR STUDY OF BILATERAL LOWER EXTREMITIES WITH AND WITHOUT EXERCISE   TECHNIQUE: Evaluation of both lower extremities were performed at rest, including calculation of ankle-brachial indices, multiple segmental pressure evaluation, segmental Doppler and segmental pulse volume recording. Ankle brachial indices were also obtained following treadmill exercise.   FINDINGS: Right Lower Extremity   Resting ABI:  1.15   Post Exercise ABI:0.83   Segmental Pressures: Normal segmental pressures, no significant (20 mmHg) pressure gradient between adjacent segments. Great toe pressure: 96 mm Hg    Arterial Waveforms: Normal tri-phasic arterial waveforms.   PVRs: Normal PVRs with maintained waveform amplitude, augmentation and quality.   Left Lower Extremity:   Resting ABI: 1.13   Post Exercise ABI:0.7   Segmental Pressures: Normal segmental pressures, no significant (20 mmHg) pressure gradient between adjacent segments. Great toe pressure: 104 mm Hg   Arterial Waveforms: Normal tri-phasic arterial waveforms.   PVRs: Normal PVRs with maintained waveform amplitude, augmentation and quality.   Other: Symmetric upper extremity pressures.   Ankle Brachial index   > 1.4 Non diagnostic secondary to incompressible vessel calcifications   1.0-1.4       Normal   0.9-0.99     Borderline PAD   0.8-0.89     Mild PAD   0.5-0.79     Moderate PAD   < 0.5          Severe PAD   Toe Brachial Index   Normal     >0.65   Moderate  0.53-0.64   Severe     <0.23   Toe Pressures   Absolute toe pressure >55 mmHg sufficient for wound healing.   Toe pressures <50 mmHg = critical limb ischemia.   IMPRESSION: 1. Normal resting ABIs bilaterally. 2. Decreased post exercise ABIs bilaterally consistent with mild disease on the right and moderate disease on the left. 3. No evidence of segmental pressure gradients or abnormal waveforms to suggest hemodynamically significant stenosis.     Assessment/Plan:     53 year old female with history of coronary artery disease status post stenting and recently had vasospastic reaction was switched to a calcium channel blocker.  She underwent segmental pressures of her bilateral lower extremities due to the left leg cramping and the segmental pressures demonstrate decrease in postexercise ABIs but without any evidence of hemodynamically significant stenoses.  I do wonder if possibly she was having vasospasm of the time of this procedure as she does not appear to have any symptoms consistent with vascular insufficiency and has easily palpable  pulses on physical exam today.  I will schedule a follow-up visit with bilateral lower extremity arterial duplexes and ABIs in 1 year but certainly if she has issues prior to that we will see her sooner.  All questions were answered and she demonstrated understanding.     Maeola Harman MD [facility]

## 2024-01-11 ENCOUNTER — Ambulatory Visit: Attending: Podiatry | Admitting: Physical Therapy

## 2024-01-11 ENCOUNTER — Other Ambulatory Visit: Payer: Self-pay

## 2024-01-11 DIAGNOSIS — M6281 Muscle weakness (generalized): Secondary | ICD-10-CM | POA: Diagnosis present

## 2024-01-11 DIAGNOSIS — M25572 Pain in left ankle and joints of left foot: Secondary | ICD-10-CM | POA: Diagnosis present

## 2024-01-11 DIAGNOSIS — M722 Plantar fascial fibromatosis: Secondary | ICD-10-CM | POA: Insufficient documentation

## 2024-01-11 DIAGNOSIS — R252 Cramp and spasm: Secondary | ICD-10-CM | POA: Diagnosis present

## 2024-01-11 DIAGNOSIS — R262 Difficulty in walking, not elsewhere classified: Secondary | ICD-10-CM | POA: Insufficient documentation

## 2024-01-11 IMAGING — US US ABDOMEN LIMITED
1 series · 14 of 25 positions shown · non-contrast
Comparison: None.

CLINICAL DATA: Right upper quadrant pain.

EXAM:
ULTRASOUND ABDOMEN LIMITED RIGHT UPPER QUADRANT

[Series 1: us abdomen limited ruq (liver/gb) · 14 of 48 slices shown]
[im 1/48]
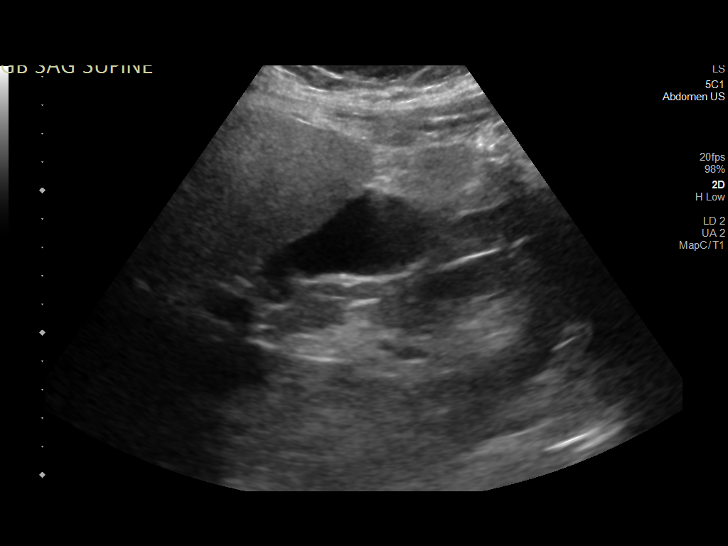
[im 4/48]
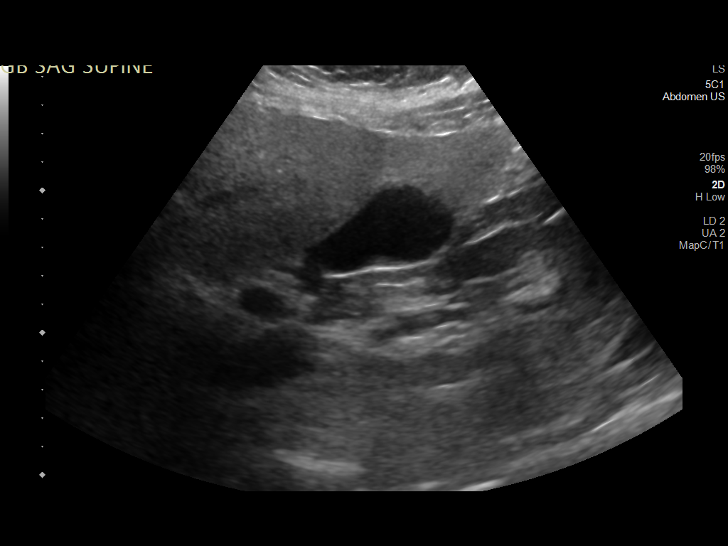
[im 8/48]
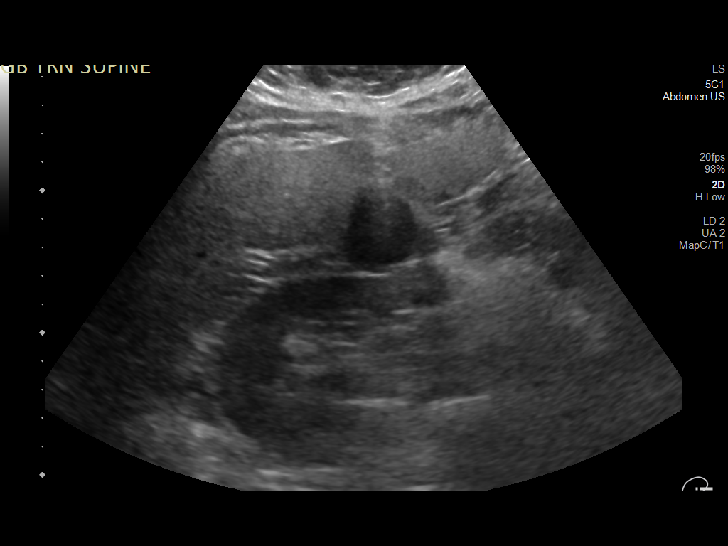
[im 12/48]
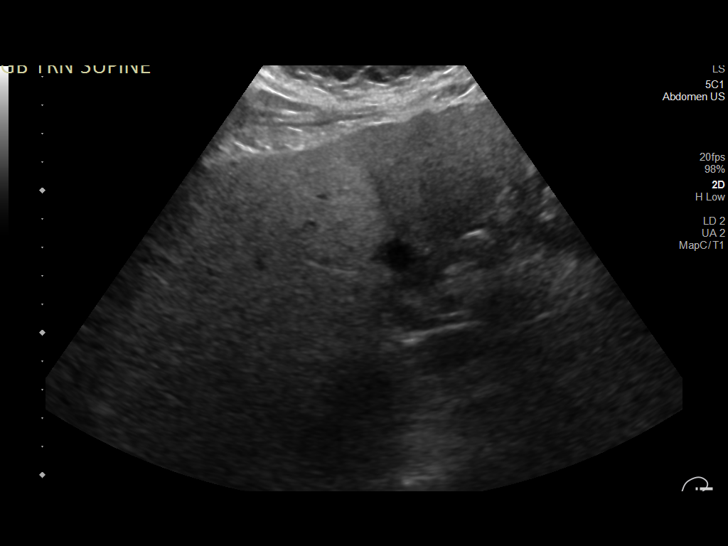
[im 16/48]
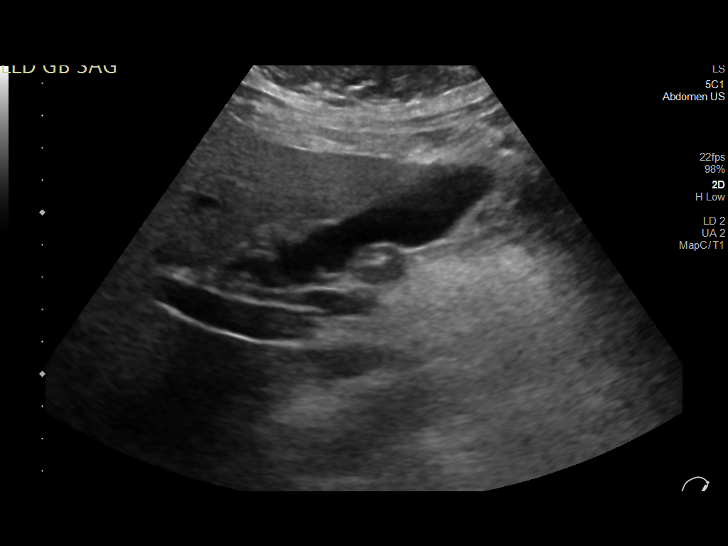
[im 18/48]
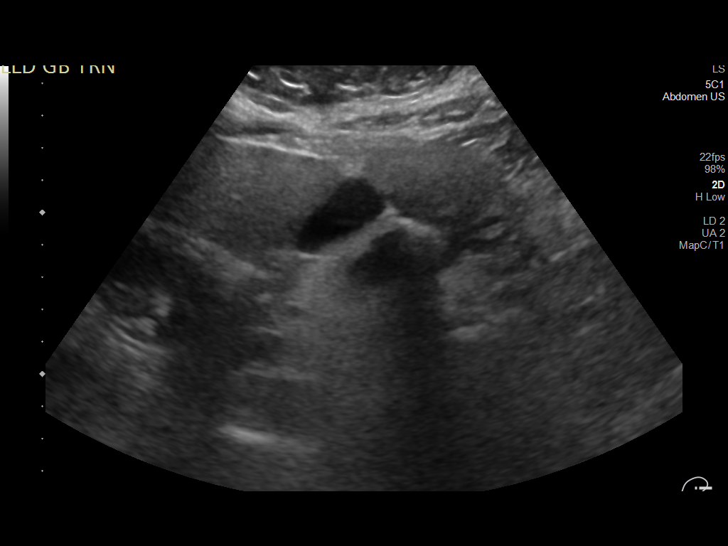
[im 22/48]
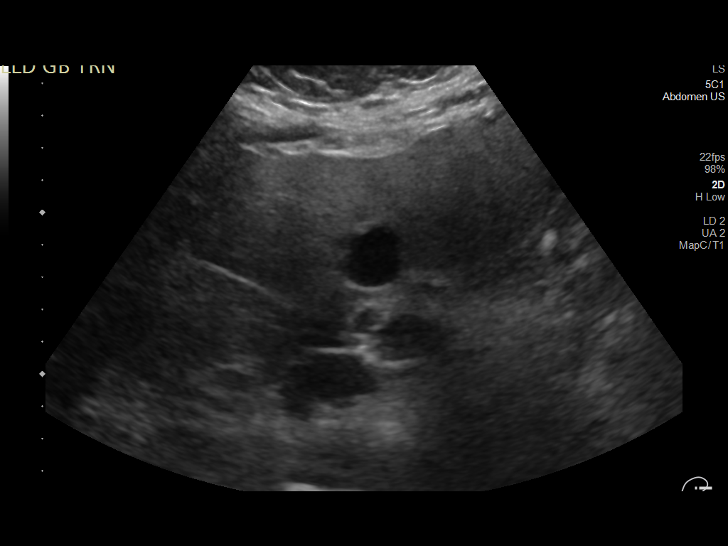
[im 26/48]
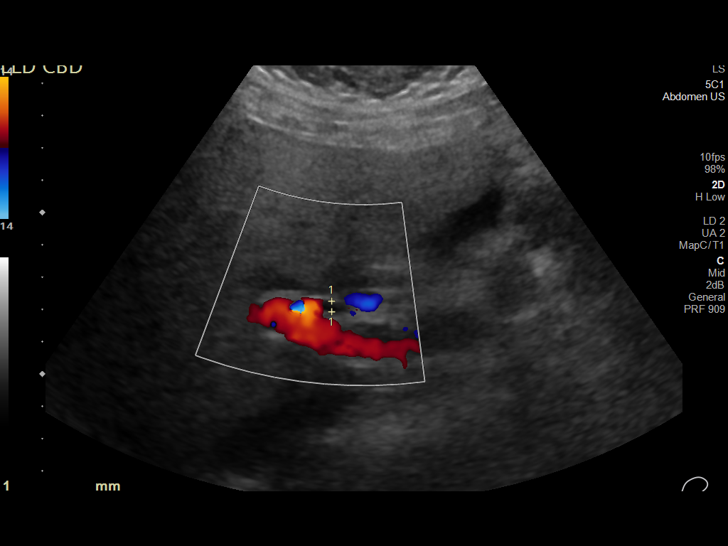
[im 30/48]
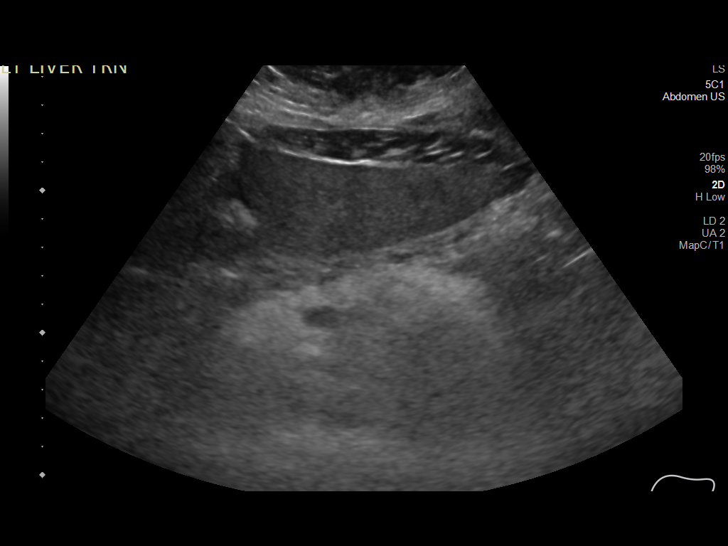
[im 32/48]
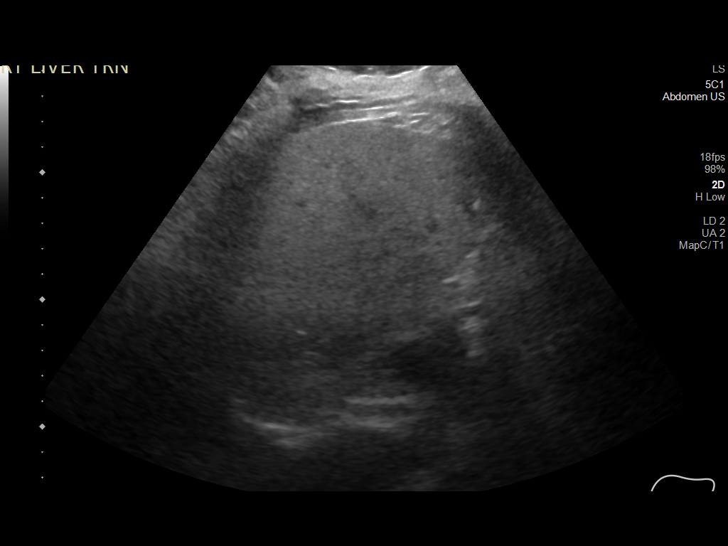
[im 36/48]
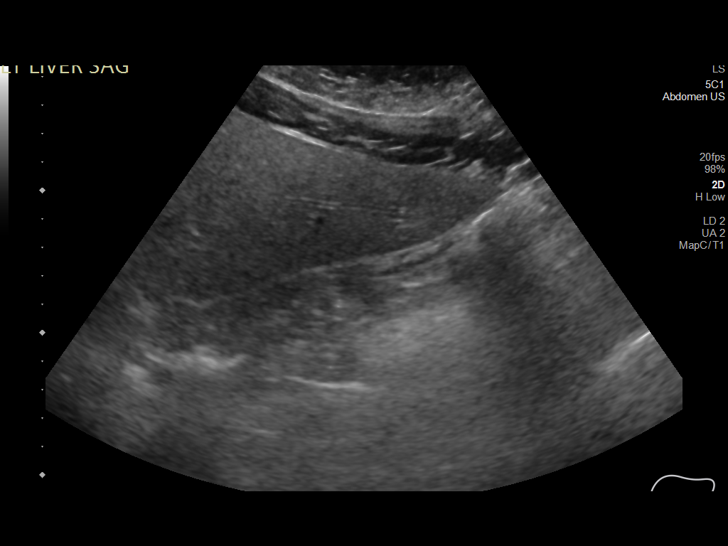
[im 40/48]
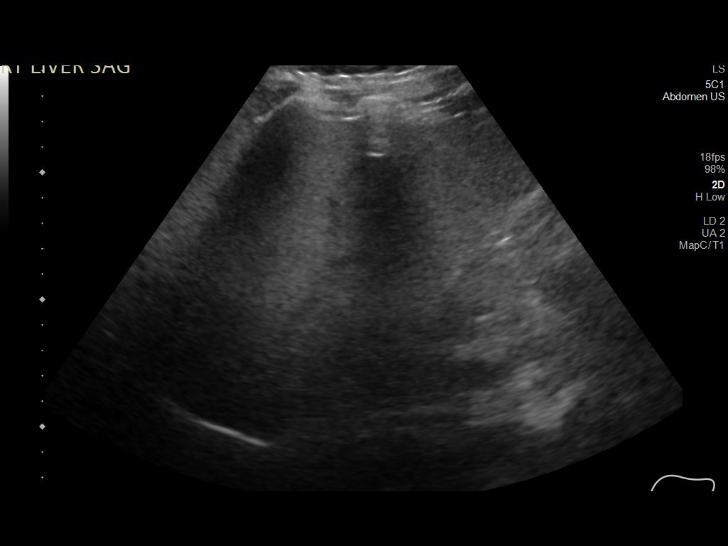
[im 44/48]
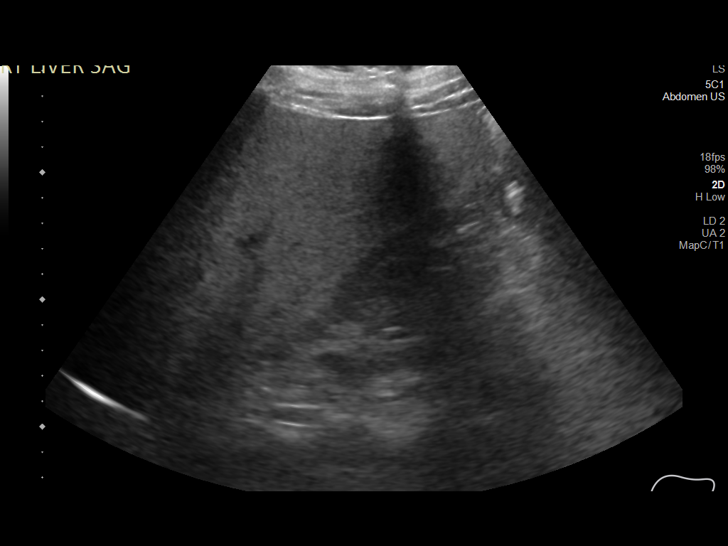
[im 48/48]
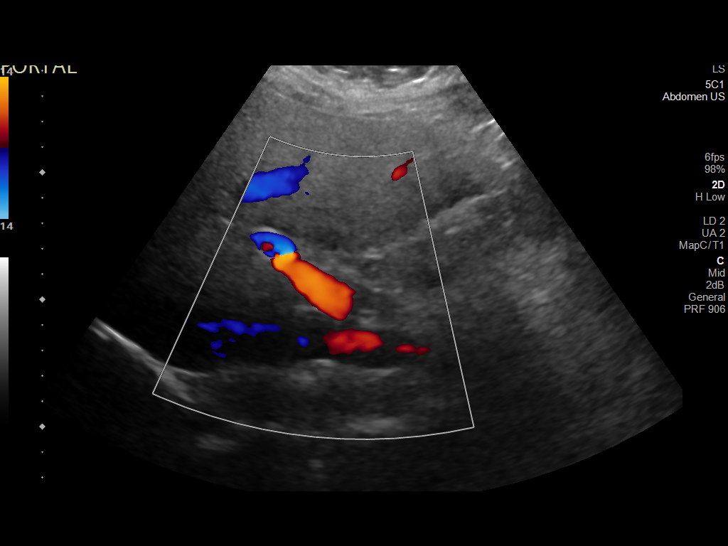

[14 of 25 positions shown; findings below may reference images not displayed]

FINDINGS: Gallbladder:

Physiologically distended. No gallstones or wall thickening
visualized. No sonographic Murphy sign noted by sonographer.

Common bile duct:

Diameter: 3 mm.

Liver:

No focal lesion identified. Diffusely increased and heterogeneous in
parenchymal echogenicity. No capsular nodularity. Portal vein is
patent on color Doppler imaging with normal direction of blood flow
towards the liver.

Other: No right upper quadrant ascites.
IMPRESSION: 1. Normal sonographic appearance of the gallbladder and biliary
tree.
2. Hepatic steatosis.

## 2024-01-11 NOTE — Patient Instructions (Signed)

## 2024-01-11 NOTE — Therapy (Unsigned)
 OUTPATIENT PHYSICAL THERAPY LOWER EXTREMITY EVALUATION   Patient Name: Angela Knight MRN: [id number] DOB:1971/05/12, 53 y.o., female Today's Date: 01/12/2024  END OF SESSION:  PT End of Session - 01/11/24 1533     Visit Number 1    Number of Visits 12    Date for PT Re-Evaluation 02/23/24    Authorization Type [employer]    PT Start Time 1533    PT Stop Time 1615    PT Time Calculation (min) 42 min             Past Medical History:  Diagnosis Date   Car sickness    Complication of anesthesia    severe   Family history of adverse reaction to anesthesia    PONV   GERD (gastroesophageal reflux disease)    occ. OTC med used   History of esophageal stricture    stenosis dilatation 05-02-2016   History of gastric polyp    benign 05-02-2016   PONV (postoperative nausea and vomiting)    SVD (spontaneous vaginal delivery)    x 6   Tubulovillous adenoma of rectum    multiple recurrent's   Wears glasses    Past Surgical History:  Procedure Laterality Date   ANTERIOR AND POSTERIOR REPAIR N/A 05/08/2017   Procedure: ANTERIOR Colporrhaphy (CYSTOCELE) AND POSTERIOR REPAIR (RECTOCELE), PERINEALORRHAPHY;  Surgeon: Olivia Mackie, MD;  Location: [facility];  Service: Gynecology;  Laterality: N/A;   COLONOSCOPY WITH PROPOFOL N/A 05/02/2016   Procedure: COLONOSCOPY WITH PROPOFOL;  Surgeon: Charolett Bumpers, MD;  Location: [facility];  Service: Endoscopy;  Laterality: N/A;   CORONARY STENT INTERVENTION N/A 03/17/2023   Procedure: CORONARY STENT INTERVENTION;  Surgeon: Corky Crafts, MD;  Location: [facility] INVASIVE CV LAB;  Service: Cardiovascular;  Laterality: N/A;   CORONARY ULTRASOUND/IVUS N/A 03/17/2023   Procedure: Coronary Ultrasound/IVUS;  Surgeon: Corky Crafts, MD;  Location: [facility] INVASIVE CV LAB;  Service: Cardiovascular;  Laterality: N/A;   ESOPHAGOGASTRODUODENOSCOPY (EGD) WITH PROPOFOL N/A 05/02/2016   Procedure: ESOPHAGOGASTRODUODENOSCOPY (EGD) WITH PROPOFOL;  Surgeon:  Charolett Bumpers, MD;  Location: [facility];  Service: Endoscopy;  Laterality: N/A;   LEFT HEART CATH AND CORONARY ANGIOGRAPHY N/A 03/15/2023   Procedure: LEFT HEART CATH AND CORONARY ANGIOGRAPHY;  Surgeon: Lennette Bihari, MD;  Location: [facility] INVASIVE CV LAB;  Service: Cardiovascular;  Laterality: N/A;   LEFT HEART CATH AND CORONARY ANGIOGRAPHY N/A 03/17/2023   Procedure: LEFT HEART CATH AND CORONARY ANGIOGRAPHY;  Surgeon: Corky Crafts, MD;  Location: [facility] INVASIVE CV LAB;  Service: Cardiovascular;  Laterality: N/A;   LEFT HEART CATH AND CORONARY ANGIOGRAPHY N/A 01/01/2024   Procedure: LEFT HEART CATH AND CORONARY ANGIOGRAPHY;  Surgeon: Yvonne Kendall, MD;  Location: [facility]VAS[facility] CV LAB;  Service: Cardiovascular;  Laterality: N/A;   ROBOTIC ASSISTED TOTAL HYSTERECTOMY WITH SALPINGECTOMY Bilateral 05/08/2017   Procedure: ROBOTIC ASSISTED TOTAL HYSTERECTOMY WITH SALPINGECTOMY And Uterosacral Ligament Suspension;  Surgeon: Olivia Mackie, MD;  Location: [facility];  Service: Gynecology;  Laterality: Bilateral;   SIGMOIDOSCOPY  multiple--   TRANSANAL EXCISION OF RECTAL MASS  06-12-2003 and 05-24-2004   tubulovillious adenoma   TUMOR EXCISION N/A 07/05/2016   Procedure: TRANSANAL EXCISION OF TUBULLOVILLOUS ADENOMA OF RECTUM;  Surgeon: Avel Peace, MD;  Location: [facility];  Service: General;  Laterality: N/A;   Patient Active Problem List   Diagnosis Date Noted   Chest pain 01/01/2024   CAD S/P percutaneous coronary angioplasty 01/01/2024   Hypercalcemia 01/01/2024   Left leg pain 01/01/2024  PVD (peripheral vascular disease) (HCC) 01/01/2024   GERD (gastroesophageal reflux disease) 01/01/2024   Type 2 diabetes mellitus (HCC) 03/15/2023   Hypertension 03/15/2023   Hyperlipidemia 03/15/2023   NSTEMI (non-ST elevated myocardial infarction) (HCC) 03/14/2023   Pelvic relaxation 05/08/2017    PCP: Delorse Lek, MD  REFERRING PROVIDER: Vivi Barrack,  DPM  REFERRING DIAG:  M72.2 (ICD-10-CM) - Plantar fasciitis, left    THERAPY DIAG:  Muscle weakness (generalized)  Pain in left ankle and joints of left foot  Difficulty in walking, not elsewhere classified  Plantar fasciitis of left foot  Rationale for Evaluation and Treatment: Rehabilitation  ONSET DATE: ~end of last school year (May 2024)  SUBJECTIVE:   SUBJECTIVE STATEMENT: Pt reports nagging plantar fasciitis and history of PAD in L LE. 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 Has been using CAM boot on/off since October. Has used night splints which didn't help. Has been icing and stretching. Has noted some cramps in L thigh. Has been changed to a different vasodilator and feels that it has been better. Has not had any orthotics. Walks 3-5 miles in a school day. States it's worse at the end of the day and not so bad in the morning.  PERTINENT HISTORY: Cardiac vasospasms, PAD, heart attack and went to cardiac rehab  PAIN:  Are you having pain? Yes: NPRS scale: 4/10 currently, <2 at best, 7 or 8 at its worst Pain location: R plantar heel Pain description: throbbing Aggravating factors: standing/walking Relieving factors: staying off of it  PRECAUTIONS: None  RED FLAGS: None   WEIGHT BEARING RESTRICTIONS: No  FALLS:  Has patient fallen in last 6 months? No  LIVING ENVIRONMENT: Lives with: lives with their spouse, 4 older boys Lives in: House/apartment Stairs: has stairs at home but bedroom is on floor level Has following equipment at home: None  OCCUPATION: Teacher  PLOF: Independent  PATIENT GOALS: Improve foot pain to tolerate work day  NEXT MD VISIT: n/a  OBJECTIVE:  Note: Objective measures were completed at Evaluation unless otherwise noted.  DIAGNOSTIC FINDINGS: 12/19/23 Foot MRI IMPRESSION: 1. Mild plantar fasciitis of the medial band of the plantar fascia towards the calcaneal insertion with perifascial edema and mild subcortical  marrow edema. 2. Mild distal Achilles tendinosis without a tear. 3. Mild partial-thickness cartilage loss of the dorsal aspect of the navicular with subchondral marrow edema at the articulation of the talonavicular joint.  PATIENT SURVEYS:  Lower Extremity Functional Score: 51 / 80 = 63.7 %  COGNITION: Overall cognitive status: Within functional limits for tasks assessed     SENSATION: Some N/T in L toes (pt states this is chronic)  EDEMA:  None  MUSCLE LENGTH: Gastroc and soleus length testing in weight bearing TBA (see AROM below for NWB)  POSTURE: relatively high bilat foot arches, minimal navicular drop from sitting to standing  PALPATION: Taut and tender primarily in L quadratus plantae, medial flexor digitorum, abductor hallucis, and soleus   LOWER EXTREMITY ROM:  Active ROM Right eval Left eval  Hip flexion    Hip extension    Hip abduction    Hip adduction    Hip internal rotation    Hip external rotation    Knee flexion    Knee extension    Ankle dorsiflexion 15 15  Ankle plantarflexion WNL WNL  Ankle inversion 20 15  Ankle eversion 18 24   (Blank rows = not tested)  LOWER EXTREMITY MMT:  MMT Right eval Left eval  Hip flexion 5 5  Hip extension 5 4+  Hip abduction 5 4  Hip adduction    Hip internal rotation    Hip external rotation    Knee flexion    Knee extension    Ankle dorsiflexion 5 4  Ankle plantarflexion 5 5*  Ankle inversion 4+ 3+  Ankle eversion 5 4   (Blank rows = not tested, * = pain)  LOWER EXTREMITY SPECIAL TESTS:  Did not assess  FUNCTIONAL TESTS:  SLS: on R: >1 min on L: >1 min but increased instability noted Single leg sit to stand unable on L without LOB Painful with single leg heel raise on L (knee straight) -- may want to assess strength of soleus (knee bent)  GAIT: Distance walked: Into clinic Assistive device utilized: None Level of assistance: Complete Independence Comments: Mildly antalgic                                                                                                                                 TREATMENT DATE: 01/11/24 See HEP below    PATIENT EDUCATION:  Education details: Exam findings, POC, initial HEP, dry needling Person educated: Patient Education method: Explanation, Demonstration, and Handouts Education comprehension: verbalized understanding, returned demonstration, and needs further education  HOME EXERCISE PROGRAM: Access Code: [email or web address] URL: [email or web address] Date: 01/11/2024 Prepared by: Vernon Prey April Kirstie Peri  Exercises - Seated Ankle Inversion with Resistance and Legs Crossed  - 1 x daily - 7 x weekly - 2 sets - 10 reps - Seated Ankle Eversion with Resistance  - 1 x daily - 7 x weekly - 2 sets - 10 reps - Seated Ankle Dorsiflexion with Resistance  - 1 x daily - 7 x weekly - 2 sets - 10 reps - Toe Yoga - Alternating Great Toe and Lesser Toe Extension  - 1 x daily - 7 x weekly - 2 sets - 10 reps - Standing Soleus Stretch  - 1 x daily - 7 x weekly - 2 sets - 30 sec hold  ASSESSMENT:  CLINICAL IMPRESSION: Patient is a 53 y.o. F who was seen today for physical therapy evaluation and treatment for L foot pain/plantar fasciitis. Pt states she has already tried wearing a CAM boot, night splints, ice bottle massage for her feet, changing shoes and stretches for the past year with not a lot of relief. Pain is worse at the end of the day vs in the mornings. PMH significant for cardiac vasospasms. Assessment significant for L>R foot instability during SLS and with single leg dynamic movement/weight shifting, taut and tender foot/ankle musculature (largest trigger point in quadratus plantae) with painful plantarflexion and toe flexion with weight bearing, and L>R foot/ankle weakness. Pt will benefit from PT to improve her L foot muscle strength, endurance, and overall mobility for improved tolerance to standing and walking tasks. Discussed trial of  TPDN next session.   OBJECTIVE IMPAIRMENTS:  Abnormal gait, decreased balance, decreased coordination, decreased endurance, decreased ROM, decreased strength, hypomobility, increased fascial restrictions, increased muscle spasms, improper body mechanics, postural dysfunction, and pain.   ACTIVITY LIMITATIONS: lifting, standing, squatting, and locomotion level  PARTICIPATION LIMITATIONS: meal prep, cleaning, shopping, community activity, occupation, yard work, and school  PERSONAL FACTORS: Age, Fitness, Past/current experiences, and Time since onset of injury/illness/exacerbation are also affecting patient's functional outcome.   REHAB POTENTIAL: Good  CLINICAL DECISION MAKING: Evolving/moderate complexity  EVALUATION COMPLEXITY: Moderate   GOALS: Goals reviewed with patient? Yes  SHORT TERM GOALS: Target date: 02/23/2024  Pt will be ind with initial HEP Baseline: Goal status: INITIAL  2.  Pt will be able to perform pain free single leg heel raise to demo improving muscle contraction Baseline:  Goal status: INITIAL   LONG TERM GOALS: Target date: 02/23/2024   Pt will be ind with management and progression for HEP Baseline:  Goal status: INITIAL  2.  Pt will be able to perform single leg sit to stand without LOB on L LE to demo improving dynamic single leg balance and strength Baseline:  Goal status: INITIAL  3.  Pt will report >/=50% improvement in her end of work day pain Baseline:  Goal status: INITIAL  4.  Pt will have improved LEFS to >/=73.7% to demo MCID Baseline:  Goal status: INITIAL    PLAN:  PT FREQUENCY: 1-2x/week  PT DURATION: 6 weeks  PLANNED INTERVENTIONS: 97164- PT Re-evaluation, 97110-Therapeutic exercises, 97530- Therapeutic activity, 97112- Neuromuscular re-education, 97535- Self Care, [id number]- Manual therapy, [phone]- Gait training, [phone]- Orthotic Fit/training, [phone]- Aquatic Therapy, [phone]- Electrical stimulation (unattended), 97016- Vasopneumatic  device, [id number]- Ultrasound, [id number]- Ionotophoresis 4mg /ml Dexamethasone, Patient/Family education, Balance training, Stair training, Taping, Dry Needling, Joint mobilization, Cryotherapy, and Moist heat  PLAN FOR NEXT SESSION: Assess response to HEP. Joint mobs/Manual/dry needling for foot/calf. L foot/ankle dynamic balance/stability, strengthening and endurance.    Namine Beahm April Ma L Marliss Buttacavoli, PT 01/12/2024, 8:21 AM

## 2024-01-18 ENCOUNTER — Ambulatory Visit

## 2024-01-18 DIAGNOSIS — M6281 Muscle weakness (generalized): Secondary | ICD-10-CM

## 2024-01-18 DIAGNOSIS — R252 Cramp and spasm: Secondary | ICD-10-CM

## 2024-01-18 DIAGNOSIS — M722 Plantar fascial fibromatosis: Secondary | ICD-10-CM

## 2024-01-18 DIAGNOSIS — M25572 Pain in left ankle and joints of left foot: Secondary | ICD-10-CM

## 2024-01-18 DIAGNOSIS — R262 Difficulty in walking, not elsewhere classified: Secondary | ICD-10-CM

## 2024-01-18 NOTE — Therapy (Addendum)
 OUTPATIENT PHYSICAL THERAPY LOWER EXTREMITY EVALUATION PHYSICAL THERAPY DISCHARGE SUMMARY  Visits from Start of Care: 2  Current functional level related to goals / functional outcomes: See below   Remaining deficits: See below   Education / Equipment: See below   Patient agrees to discharge. Patient goals were met. Patient is being discharged due to meeting the stated rehab goals.    Patient Name: Angela Knight MRN: [id number] DOB:1971/03/05, 53 y.o., female Today's Date: 02/16/2024  END OF SESSION:    Past Medical History:  Diagnosis Date   Car sickness    Complication of anesthesia    severe   Family history of adverse reaction to anesthesia    PONV   GERD (gastroesophageal reflux disease)    occ. OTC med used   History of esophageal stricture    stenosis dilatation 05-02-2016   History of gastric polyp    benign 05-02-2016   PONV (postoperative nausea and vomiting)    SVD (spontaneous vaginal delivery)    x 6   Tubulovillous adenoma of rectum    multiple recurrent's   Wears glasses    Past Surgical History:  Procedure Laterality Date   ANTERIOR AND POSTERIOR REPAIR N/A 05/08/2017   Procedure: ANTERIOR Colporrhaphy (CYSTOCELE) AND POSTERIOR REPAIR (RECTOCELE), PERINEALORRHAPHY;  Surgeon: Meriam Stamp, MD;  Location: [facility];  Service: Gynecology;  Laterality: N/A;   COLONOSCOPY WITH PROPOFOL  N/A 05/02/2016   Procedure: COLONOSCOPY WITH PROPOFOL ;  Surgeon: Garrett Kallman, MD;  Location: [facility];  Service: Endoscopy;  Laterality: N/A;   CORONARY STENT INTERVENTION N/A 03/17/2023   Procedure: CORONARY STENT INTERVENTION;  Surgeon: Lucendia Rusk, MD;  Location: [facility] INVASIVE CV LAB;  Service: Cardiovascular;  Laterality: N/A;   CORONARY ULTRASOUND/IVUS N/A 03/17/2023   Procedure: Coronary Ultrasound/IVUS;  Surgeon: Lucendia Rusk, MD;  Location: [facility] INVASIVE CV LAB;  Service: Cardiovascular;  Laterality: N/A;   ESOPHAGOGASTRODUODENOSCOPY (EGD) WITH  PROPOFOL  N/A 05/02/2016   Procedure: ESOPHAGOGASTRODUODENOSCOPY (EGD) WITH PROPOFOL ;  Surgeon: Garrett Kallman, MD;  Location: [facility];  Service: Endoscopy;  Laterality: N/A;   LEFT HEART CATH AND CORONARY ANGIOGRAPHY N/A 03/15/2023   Procedure: LEFT HEART CATH AND CORONARY ANGIOGRAPHY;  Surgeon: Millicent Ally, MD;  Location: [facility] INVASIVE CV LAB;  Service: Cardiovascular;  Laterality: N/A;   LEFT HEART CATH AND CORONARY ANGIOGRAPHY N/A 03/17/2023   Procedure: LEFT HEART CATH AND CORONARY ANGIOGRAPHY;  Surgeon: Lucendia Rusk, MD;  Location: [facility] INVASIVE CV LAB;  Service: Cardiovascular;  Laterality: N/A;   LEFT HEART CATH AND CORONARY ANGIOGRAPHY N/A 01/01/2024   Procedure: LEFT HEART CATH AND CORONARY ANGIOGRAPHY;  Surgeon: Sammy Crisp, MD;  Location: [facility] INVASIVE CV LAB;  Service: Cardiovascular;  Laterality: N/A;   ROBOTIC ASSISTED TOTAL HYSTERECTOMY WITH SALPINGECTOMY Bilateral 05/08/2017   Procedure: ROBOTIC ASSISTED TOTAL HYSTERECTOMY WITH SALPINGECTOMY And Uterosacral Ligament Suspension;  Surgeon: Meriam Stamp, MD;  Location: [facility];  Service: Gynecology;  Laterality: Bilateral;   SIGMOIDOSCOPY  multiple--   TRANSANAL EXCISION OF RECTAL MASS  06-12-2003 and 05-24-2004   tubulovillious adenoma   TUMOR EXCISION N/A 07/05/2016   Procedure: TRANSANAL EXCISION OF TUBULLOVILLOUS ADENOMA OF RECTUM;  Surgeon: Adalberto Hollow, MD;  Location: [facility];  Service: General;  Laterality: N/A;   Patient Active Problem List   Diagnosis Date Noted   Chest pain 01/01/2024   CAD S/P percutaneous coronary angioplasty 01/01/2024   Hypercalcemia 01/01/2024   Left leg pain 01/01/2024   PVD (peripheral vascular disease) (HCC) 01/01/2024  GERD (gastroesophageal reflux disease) 01/01/2024   Type 2 diabetes mellitus (HCC) 03/15/2023   Hypertension 03/15/2023   Hyperlipidemia 03/15/2023   NSTEMI (non-ST elevated myocardial infarction) (HCC) 03/14/2023   Pelvic relaxation  05/08/2017    PCP: Judy Null, MD  REFERRING PROVIDER: Charity Conch, DPM  REFERRING DIAG:  M72.2 (ICD-10-CM) - Plantar fasciitis, left    THERAPY DIAG:  Muscle weakness (generalized)  Pain in left ankle and joints of left foot  Difficulty in walking, not elsewhere classified  Cramp and spasm  Plantar fasciitis of left foot  Rationale for Evaluation and Treatment: Rehabilitation  ONSET DATE: ~end of last school year (May 2024)  SUBJECTIVE:   SUBJECTIVE STATEMENT: Pt reports no changes in her pain or symptoms.    PERTINENT HISTORY: Cardiac vasospasms, PAD, heart attack and went to cardiac rehab  PAIN:   01/18/24 Are you having pain? Yes: NPRS scale: 5/10 currently Pain location: R plantar heel Pain description: throbbing Aggravating factors: standing/walking Relieving factors: staying off of it  PRECAUTIONS: None  RED FLAGS: None   WEIGHT BEARING RESTRICTIONS: No  FALLS:  Has patient fallen in last 6 months? No  LIVING ENVIRONMENT: Lives with: lives with their spouse, 4 older boys Lives in: House/apartment Stairs: has stairs at home but bedroom is on floor level Has following equipment at home: None  OCCUPATION: Teacher  PLOF: Independent  PATIENT GOALS: Improve foot pain to tolerate work day  NEXT MD VISIT: n/a  OBJECTIVE:  Note: Objective measures were completed at Evaluation unless otherwise noted.  DIAGNOSTIC FINDINGS: 12/19/23 Foot MRI IMPRESSION: 1. Mild plantar fasciitis of the medial band of the plantar fascia towards the calcaneal insertion with perifascial edema and mild subcortical marrow edema. 2. Mild distal Achilles tendinosis without a tear. 3. Mild partial-thickness cartilage loss of the dorsal aspect of the navicular with subchondral marrow edema at the articulation of the talonavicular joint.  PATIENT SURVEYS:  Lower Extremity Functional Score: 51 / 80 = 63.7 %  COGNITION: Overall cognitive status: Within  functional limits for tasks assessed     SENSATION: Some N/T in L toes (pt states this is chronic)  EDEMA:  None  MUSCLE LENGTH: Gastroc and soleus length testing in weight bearing TBA (see AROM below for NWB)  POSTURE: relatively high bilat foot arches, minimal navicular drop from sitting to standing  PALPATION: Taut and tender primarily in L quadratus plantae, medial flexor digitorum, abductor hallucis, and soleus   LOWER EXTREMITY ROM:  Active ROM Right eval Left eval  Hip flexion    Hip extension    Hip abduction    Hip adduction    Hip internal rotation    Hip external rotation    Knee flexion    Knee extension    Ankle dorsiflexion 15 15  Ankle plantarflexion WNL WNL  Ankle inversion 20 15  Ankle eversion 18 24   (Blank rows = not tested)  LOWER EXTREMITY MMT:  MMT Right eval Left eval  Hip flexion 5 5  Hip extension 5 4+  Hip abduction 5 4  Hip adduction    Hip internal rotation    Hip external rotation    Knee flexion    Knee extension    Ankle dorsiflexion 5 4  Ankle plantarflexion 5 5*  Ankle inversion 4+ 3+  Ankle eversion 5 4   (Blank rows = not tested, * = pain)  LOWER EXTREMITY SPECIAL TESTS:  Did not assess  FUNCTIONAL TESTS:  SLS: on R: >1  min on L: >1 min but increased instability noted Single leg sit to stand unable on L without LOB Painful with single leg heel raise on L (knee straight) -- may want to assess strength of soleus (knee bent)  GAIT: Distance walked: Into clinic Assistive device utilized: None Level of assistance: Complete Independence Comments: Mildly antalgic                                                                                                                                TREATMENT DATE: 01/18/24 Nustep x 5 min level 3 Rocker board x 2 min  Gastroc  stretch on rocker board x 10 holding 10 sec both Soleus stretch on rocker board x 5 holding 10 sec left Prone manual gastroc/soleus STM, STM of plantar  fascia, PROM ankle DF, inversion and eversion, Plantar fascia stretch Instructed to have her feet evaluated for a good set of orthotics.  Suggested fleet feet.    TREATMENT DATE: 01/11/24 See HEP below    PATIENT EDUCATION:  Education details: Exam findings, POC, initial HEP, dry needling Person educated: Patient Education method: Explanation, Demonstration, and Handouts Education comprehension: verbalized understanding, returned demonstration, and needs further education  HOME EXERCISE PROGRAM: Access Code: [email or web address] URL: [email or web address] Date: 01/11/2024 Prepared by: Gellen April Erman Hayward  Exercises - Seated Ankle Inversion with Resistance and Legs Crossed  - 1 x daily - 7 x weekly - 2 sets - 10 reps - Seated Ankle Eversion with Resistance  - 1 x daily - 7 x weekly - 2 sets - 10 reps - Seated Ankle Dorsiflexion with Resistance  - 1 x daily - 7 x weekly - 2 sets - 10 reps - Toe Yoga - Alternating Great Toe and Lesser Toe Extension  - 1 x daily - 7 x weekly - 2 sets - 10 reps - Standing Soleus Stretch  - 1 x daily - 7 x weekly - 2 sets - 30 sec hold  ASSESSMENT:  CLINICAL IMPRESSION: Rahmah had not experienced any relief from exercises from first visit.  She actually has fairly good DF ROM.  She has an extremely high arch, however in the involved left foot.  She would likely do very well with arch support to alleviate the spring loading during stance phase which places tension at the heel attachment of the plantar fascia.  She is compliant and well motivated.  She should continue to do well.  She would benefit from continuing skilled PT for left ankle ROM and strengthening to normalize gait and reduce stress to plantar fascia.    OBJECTIVE IMPAIRMENTS: Abnormal gait, decreased balance, decreased coordination, decreased endurance, decreased ROM, decreased strength, hypomobility, increased fascial restrictions, increased muscle spasms, improper body mechanics, postural  dysfunction, and pain.   ACTIVITY LIMITATIONS: lifting, standing, squatting, and locomotion level  PARTICIPATION LIMITATIONS: meal prep, cleaning, shopping, community activity, occupation, yard work, and school  PERSONAL FACTORS: Age, Fitness, Past/current experiences, and Time since onset  of injury/illness/exacerbation are also affecting patient's functional outcome.   REHAB POTENTIAL: Good  CLINICAL DECISION MAKING: Evolving/moderate complexity  EVALUATION COMPLEXITY: Moderate   GOALS: Goals reviewed with patient? Yes  SHORT TERM GOALS: Target date: 02/23/2024   ALL GOALS MET PER PATIENT   Pt will be ind with initial HEP Baseline: Goal status: INITIAL  2.  Pt will be able to perform pain free single leg heel raise to demo improving muscle contraction Baseline:  Goal status: INITIAL   LONG TERM GOALS: Target date: 02/23/2024   Pt will be ind with management and progression for HEP Baseline:  Goal status: INITIAL  2.  Pt will be able to perform single leg sit to stand without LOB on L LE to demo improving dynamic single leg balance and strength Baseline:  Goal status: INITIAL  3.  Pt will report >/=50% improvement in her end of work day pain Baseline:  Goal status: INITIAL  4.  Pt will have improved LEFS to >/=73.7% to demo MCID Baseline:  Goal status: INITIAL    PLAN:  PT FREQUENCY: 1-2x/week  PT DURATION: 6 weeks  PLANNED INTERVENTIONS: 97164- PT Re-evaluation, 97110-Therapeutic exercises, 97530- Therapeutic activity, 97112- Neuromuscular re-education, 97535- Self Care, 56433- Manual therapy, [phone]- Gait training, [phone]- Orthotic Fit/training, [phone]- Aquatic Therapy, [phone]- Electrical stimulation (unattended), 97016- Vasopneumatic device, [id number]- Ultrasound, [id number]- Ionotophoresis 4mg /ml Dexamethasone , Patient/Family education, Balance training, Stair training, Taping, Dry Needling, Joint mobilization, Cryotherapy, and Moist heat  PLAN FOR NEXT SESSION: Patient  defers DN for now.  Check to see if she obtained orthotics.  Joint mobs/Manual for foot/calf. L foot/ankle dynamic balance/stability, strengthening and endurance.   DC per patient request 02/16/24  Ronalee Cocking. Granville Whitefield, PT 02/16/24 11:15 AM  [facility] B. Sheryll Dymek, PT 02/16/24 11:15 AM [facility] [address], [address] [place], [place] [address] Phone # [phone] Fax [phone]

## 2024-01-23 ENCOUNTER — Ambulatory Visit: Admitting: Physical Therapy

## 2024-01-25 ENCOUNTER — Ambulatory Visit: Admitting: Physical Therapy

## 2024-01-29 ENCOUNTER — Encounter: Admitting: Physical Therapy

## 2024-01-31 ENCOUNTER — Encounter: Admitting: Physical Therapy

## 2024-02-01 ENCOUNTER — Telehealth: Payer: Self-pay | Admitting: Internal Medicine

## 2024-02-01 ENCOUNTER — Other Ambulatory Visit (HOSPITAL_COMMUNITY): Payer: Self-pay

## 2024-02-01 MED ORDER — AMLODIPINE BESYLATE 2.5 MG PO TABS: 2.5000 mg | ORAL_TABLET | Freq: Every day | ORAL | 3 refills | Status: AC

## 2024-02-01 NOTE — Telephone Encounter (Signed)
 Pt's medication was sent to pt's pharmacy as requested. Confirmation received.

## 2024-02-01 NOTE — Telephone Encounter (Signed)
*  STAT* If patient is at the pharmacy, call can be transferred to refill team.   1. Which medications need to be refilled? (please list name of each medication and dose if known) amLODipine (NORVASC) 2.5 MG tablet     4. Which pharmacy/location (including street and city if local pharmacy) is medication to be sent to? [id number] [place], [place] - [address]    5. Do they need a 30 day or 90 day supply? 90

## 2024-02-06 ENCOUNTER — Ambulatory Visit: Admitting: Physical Therapy

## 2024-02-15 ENCOUNTER — Ambulatory Visit

## 2024-02-16 ENCOUNTER — Telehealth: Payer: Self-pay

## 2024-02-16 NOTE — Telephone Encounter (Signed)
 Call placed to patient to inquire about missed appointment on 02/15/24.  Patient states that she is feeling better and forgot to call and cancel remaining appointments.  She would like to be discharged at this time.  All remaining appts removed.

## 2024-02-20 ENCOUNTER — Other Ambulatory Visit: Payer: Self-pay | Admitting: Internal Medicine

## 2024-02-20 ENCOUNTER — Other Ambulatory Visit: Payer: Self-pay | Admitting: Podiatry

## 2024-02-20 ENCOUNTER — Ambulatory Visit

## 2024-02-22 ENCOUNTER — Encounter: Admitting: Physical Therapy

## 2024-03-16 ENCOUNTER — Other Ambulatory Visit: Payer: Self-pay | Admitting: Podiatry

## 2024-03-20 ENCOUNTER — Other Ambulatory Visit: Payer: Self-pay | Admitting: Podiatry

## 2024-04-27 ENCOUNTER — Other Ambulatory Visit: Payer: Self-pay | Admitting: Internal Medicine

## 2024-04-29 ENCOUNTER — Telehealth: Payer: Self-pay | Admitting: Internal Medicine

## 2024-04-29 NOTE — Telephone Encounter (Signed)
 Per last office visit note patient was to continue Brilinta  and it also states she should start cilostazol .  Per AVS patient was instructed to stop brilinta  and start ciolostazol. Neither medication is listed in patient's chart. Please confirm what the patient should be taking. She is requesting and refill of Brilinta .

## 2024-04-29 NOTE — Telephone Encounter (Signed)
 Pt c/o medication issue:  1. Name of Medication: Rx #: [id number]  ticagrelor  (BRILINTA ) 90 MG TABS tablet [[id number]]  ENDED    2. How are you currently taking this medication (dosage and times per day)?        Take 1 tablet (90 mg total) by mouth 2 (two) times daily.          3. Are you having a reaction (difficulty breathing--STAT)? No   4. What is your medication issue? Pt called in requesting refill, med no longer on medication list. Please advise if this can be added back [facility] Scheduled for [facility]/24 for 3 mon f/u   [id number] [place], [place] - [address] Phone: [phone]  Fax: [phone]

## 2024-05-01 NOTE — Telephone Encounter (Signed)
 Called pt advised if MD recommendations.  Pt reports does not want to make changes until is seen in office.  Expressed feels that change should be talked about in person.    Pt reports has enough Brilinta  to last until 05/16/24 OV.  Advised pt will send message to providers to make aware of decision.

## 2024-05-13 NOTE — Progress Notes (Unsigned)
 Cardiology Office Note:    Date:  05/16/2024   ID:  Angela Knight, DOB 11/22/1970, MRN [id number]  PCP:  Angela Thresa LABOR, MD   [facility] Providers Cardiologist:  Stanly Knight Leavens, MD Cardiology APP:  Madie Jon Garre, PA     Referring MD: Angela Thresa LABOR, MD   Chief Complaint  Patient presents with   Follow-up    Chest pain    History of Present Illness:    Angela Knight is a 53 y.o. female with a hx of CAD, HTN, DM2.  LHC for chest pain showed high grade 95% mid RCA stenosis followed by aneurysmal dilatation of the RCA as well as as well as 90% mid LAD stenosis at the D2 bifurcation. Case discussed with interventional team and decision was made to proceed with staged intervention:  DES-RCA with 3.5 x 20 mm and DES-mLAD 2.75 x 16 mm. Due to vasospasm, cath ultimately through groin. She tolerated the procedure and discharged on DAPT.   She started school again but has developed recurrence of chest pain. She was seen by Dr. Leavens on 12/26/23 with intermittent chest pain that typically began as neck pain and radiated to her left chest and back. She reports one episode of nausea with CP and one episode of diaphoresis with a different chest pain. She was scheduled for a nuclear stress test to evaluate CP. She also discussed left leg pain and underwent ABIs which showed normal resting ABIs bilaterally, but decreased post exercise ABIs with mild disease on right and moderate disease on the left. Given leg pain, she was taken off brilinta  and started on pletal  at that visit (12/26/23). Since starting pletal , she has had worsening dizziness, headache, and generally feeling unwell. She was hospitalized 12/2023 and underwent repeat heart cath that showed patent grafts. Concern for vasospasm - losartan  was transitioned to amlodipine  and lopressor  added.   She presents for general cardiology follow up.   She had CP that woke her from sleep Monday night, 3 days ago. She had a  shock across her chest followed by a shock to her right groin that woke her from sleep. Episodes kept happening, then she had body shakes/chills, very similar to what happened in March prior to last hospitalization and LHC. Husband put his hand on her chest and could feel muscle spasms.   Overall, she thinks the amlodipine  has improved symptoms, including leg pain.   She is also having intermittent pain in her right lateral side with discomfort when sleeping on that side, not associated with eating and not worse with deep inspiration.   Past Medical History:  Diagnosis Date   Car sickness    Complication of anesthesia    severe   Family history of adverse reaction to anesthesia    PONV   GERD (gastroesophageal reflux disease)    occ. OTC med used   History of esophageal stricture    stenosis dilatation 05-02-2016   History of gastric polyp    benign 05-02-2016   PONV (postoperative nausea and vomiting)    SVD (spontaneous vaginal delivery)    x 6   Tubulovillous adenoma of rectum    multiple recurrent's   Wears glasses     Past Surgical History:  Procedure Laterality Date   ANTERIOR AND POSTERIOR REPAIR N/A 05/08/2017   Procedure: ANTERIOR Colporrhaphy (CYSTOCELE) AND POSTERIOR REPAIR (RECTOCELE), PERINEALORRHAPHY;  Surgeon: Gorge Ade, MD;  Location: [facility];  Service: Gynecology;  Laterality: N/A;   COLONOSCOPY WITH  PROPOFOL  N/A 05/02/2016   Procedure: COLONOSCOPY WITH PROPOFOL ;  Surgeon: Gladis MARLA Louder, MD;  Location: [facility];  Service: Endoscopy;  Laterality: N/A;   CORONARY STENT INTERVENTION N/A 03/17/2023   Procedure: CORONARY STENT INTERVENTION;  Surgeon: Dann Candyce RAMAN, MD;  Location: [facility] INVASIVE CV LAB;  Service: Cardiovascular;  Laterality: N/A;   CORONARY ULTRASOUND/IVUS N/A 03/17/2023   Procedure: Coronary Ultrasound/IVUS;  Surgeon: Dann Candyce RAMAN, MD;  Location: [facility] INVASIVE CV LAB;  Service: Cardiovascular;  Laterality: N/A;    ESOPHAGOGASTRODUODENOSCOPY (EGD) WITH PROPOFOL  N/A 05/02/2016   Procedure: ESOPHAGOGASTRODUODENOSCOPY (EGD) WITH PROPOFOL ;  Surgeon: Gladis MARLA Louder, MD;  Location: [facility];  Service: Endoscopy;  Laterality: N/A;   LEFT HEART CATH AND CORONARY ANGIOGRAPHY N/A 03/15/2023   Procedure: LEFT HEART CATH AND CORONARY ANGIOGRAPHY;  Surgeon: Burnard Debby LABOR, MD;  Location: [facility] INVASIVE CV LAB;  Service: Cardiovascular;  Laterality: N/A;   LEFT HEART CATH AND CORONARY ANGIOGRAPHY N/A 03/17/2023   Procedure: LEFT HEART CATH AND CORONARY ANGIOGRAPHY;  Surgeon: Dann Candyce RAMAN, MD;  Location: [facility] INVASIVE CV LAB;  Service: Cardiovascular;  Laterality: N/A;   LEFT HEART CATH AND CORONARY ANGIOGRAPHY N/A 01/01/2024   Procedure: LEFT HEART CATH AND CORONARY ANGIOGRAPHY;  Surgeon: Mady Bruckner, MD;  Location: [facility] INVASIVE CV LAB;  Service: Cardiovascular;  Laterality: N/A;   ROBOTIC ASSISTED TOTAL HYSTERECTOMY WITH SALPINGECTOMY Bilateral 05/08/2017   Procedure: ROBOTIC ASSISTED TOTAL HYSTERECTOMY WITH SALPINGECTOMY And Uterosacral Ligament Suspension;  Surgeon: Gorge Ade, MD;  Location: [facility];  Service: Gynecology;  Laterality: Bilateral;   SIGMOIDOSCOPY  multiple--   TRANSANAL EXCISION OF RECTAL MASS  06-12-2003 and 05-24-2004   tubulovillious adenoma   TUMOR EXCISION N/A 07/05/2016   Procedure: TRANSANAL EXCISION OF TUBULLOVILLOUS ADENOMA OF RECTUM;  Surgeon: Krystal Russell, MD;  Location: [facility];  Service: General;  Laterality: N/A;    Current Medications: Current Meds  Medication Sig   amLODipine  (NORVASC ) 2.5 MG tablet Take 1 tablet (2.5 mg total) by mouth daily.   aspirin  EC 81 MG tablet Take 1 tablet (81 mg total) by mouth daily. Swallow whole.   atorvastatin  (LIPITOR ) 80 MG tablet TAKE 1 TABLET BY MOUTH EVERYDAY AT BEDTIME   famotidine-calcium  carbonate-magnesium hydroxide (PEPCID COMPLETE) 10-800-165 MG chewable tablet Chew 1 tablet by mouth as needed (heartburn).    metFORMIN  (GLUCOPHAGE -XR) 500 MG 24 hr tablet Take 1 tablet (500 mg total) by mouth 2 (two) times daily.   metoprolol  succinate (TOPROL  XL) 25 MG 24 hr tablet Take 0.5 tablets (12.5 mg total) by mouth daily.   nitroGLYCERIN  (NITROSTAT ) 0.4 MG SL tablet Place 1 tablet (0.4 mg total) under the tongue every 5 (five) minutes x 3 doses as needed for chest pain.   OMEPRAZOLE PO Take 1 tablet by mouth daily. Pt unsure of strength   [DISCONTINUED] metoprolol  tartrate (LOPRESSOR ) 25 MG tablet TAKE 0.5 TABLETS BY MOUTH 2 TIMES DAILY.     Allergies:   Codeine, Coffee bean extract [coffea arabica], Other, Lisinopril, Pseudoephedrine, and Surgical lubricant   Social History   Socioeconomic History   Marital status: Married    Spouse name: Not on file   Number of children: Not on file   Years of education: Not on file   Highest education level: Not on file  Occupational History   Not on file  Tobacco Use   Smoking status: Never   Smokeless tobacco: Never  Vaping Use   Vaping status: Never Used  Substance and Sexual Activity   Alcohol  use: No   Drug use: No   Sexual activity: Yes    Birth control/protection: Condom  Other Topics Concern   Not on file  Social History Narrative   Not on file   Social Drivers of Health   Financial Resource Strain: Not on file  Food Insecurity: Low Risk  (02/13/2024)   Received from [facility]   Hunger Vital Sign    Within the past 12 months, you worried that your food would run out before you got money to buy more: Never true    Within the past 12 months, the food you bought just didn't last and you didn't have money to get more. : Never true  Transportation Needs: No Transportation Needs (02/13/2024)   Received from [facility]    In the past 12 months, has lack of reliable transportation kept you from medical appointments, meetings, work or from getting things needed for daily living? : No  Physical Activity: Not on file  Stress:  Not on file  Social Connections: Not on file     Family History: The patient's family history includes Cancer in her father and mother; Diabetes in her mother and sister; High blood pressure in her mother.  ROS:   Please see the history of present illness.     All other systems reviewed and are negative.  EKGs/Labs/Other Studies Reviewed:    The following studies were reviewed today:  EKG Interpretation Date/Time:  Thursday May 16 2024 09:31:08 EDT Ventricular Rate:  52 PR Interval:  166 QRS Duration:  76 QT Interval:  400 QTC Calculation: 372 R Axis:   82  Text Interpretation: Sinus bradycardia When compared with ECG of 01-Jan-2024 01:25, QT has shortened Confirmed by Madie Slough ([id number]) on 05/16/2024 9:33:03 AM    Recent Labs: 05/24/2023: ALT 28 01/02/2024: BUN 8; Creatinine, Ser 0.71; Hemoglobin 14.1; Platelets 314; Potassium 3.8; Sodium 138  Recent Lipid Panel    Component Value Date/Time   CHOL 119 01/01/2024 0130   CHOL 102 05/24/2023 0740   TRIG 92 01/01/2024 0130   HDL 57 01/01/2024 0130   HDL 35 (L) 05/24/2023 0740   CHOLHDL 2.1 01/01/2024 0130   VLDL 18 01/01/2024 0130   LDLCALC 44 01/01/2024 0130   LDLCALC 46 05/24/2023 0740     Risk Assessment/Calculations:                Physical Exam:    VS:  BP 110/78 (BP Location: Left Arm, Patient Position: Sitting)   Pulse (!) 54   Ht 5' 6 (1.676 m)   Wt 189 lb 8 oz (86 kg)   LMP 04/27/2017 (Approximate)   SpO2 98%   BMI 30.59 kg/m     Wt Readings from Last 3 Encounters:  05/16/24 189 lb 8 oz (86 kg)  01/10/24 182 lb (82.6 kg)  01/01/24 180 lb (81.6 kg)     GEN:  Well nourished, well developed in no acute distress HEENT: Normal NECK: No JVD; No carotid bruits LYMPHATICS: No lymphadenopathy CARDIAC: RRR, no murmurs, rubs, gallops RESPIRATORY:  Clear to auscultation without rales, wheezing or rhonchi  ABDOMEN: Soft, non-tender, non-distended MUSCULOSKELETAL:  No edema; No deformity  SKIN:  Warm and dry NEUROLOGIC:  Alert and oriented x 3 PSYCHIATRIC:  Normal affect   ASSESSMENT:    1. Precordial pain   2. Chest pain of uncertain etiology   3. Right lateral abdominal pain   4. CAD S/P percutaneous coronary angioplasty   5. Vasospasm (  HCC)   6. Primary hypertension   7. Hyperlipidemia with target LDL less than 70    PLAN:    In order of problems listed above:  Chest discomfort with muscle spasm - EKG today showed sinus bradycardia, no evidence of ischemia - do not suspect a cardiac etiology - I think her symptoms sound most consistent with MSK/muscle spasm - recommended hydration and OTC magnesium citrate   Intermittent pain in right lateral side under ribs - not associated with eating or deep inspiration, sounds positional - EKG nonischemic, but bradycardic - she seem asymptomatic, will keep BB but transition to toprol    CAD DES-LAD, DES-RCA - completed DAPT as of 02/2024 - consider ASA monotherapy, will stop brilinta  - transition lopressor  to toprol  at night (all other medications are once daily)   Vasospasm - continue lopressor  and amlodipine  - doing better on amlodipine , which has also helped her leg pain - will trial OTC Mg   Hypertension - continue 2.5 mg amlodipine , 12.5 mg lopressor  BID - BP well controlled, no room to titrate amlodipine    Hyperlipidemia with LDL goal < 70 01/01/2024: Cholesterol 119; HDL 57; LDL Cholesterol 44; Triglycerides 92; VLDL 18 Continue 80 mg lipitor  - check labs next March 2026   Follow up in 6 months.            Medication Adjustments/Labs and Tests Ordered: Current medicines are reviewed at length with the patient today.  Concerns regarding medicines are outlined above.  Orders Placed This Encounter  Procedures   EKG 12-Lead   Meds ordered this encounter  Medications   metoprolol  succinate (TOPROL  XL) 25 MG 24 hr tablet    Sig: Take 0.5 tablets (12.5 mg total) by mouth daily.    Dispense:  45  tablet    Refill:  3    Patient Instructions  Medication Instructions:  STOP BRILINTA   STOP LOPRESSOR   START TOPROL  12.5 MG DAILY   Can try: Magnesium citrate 200 mg coated tablets to help with muscle cramping (side effect would be diarrhea at higher doses) Magnesium L-threonate to help with sleep (take at night)  *If you need a refill on your cardiac medications before your next appointment, please call your pharmacy*  Lab Work: NONE If you have labs (blood work) drawn today and your tests are completely normal, you will receive your results only by: MyChart Message (if you have MyChart) OR A paper copy in the mail If you have any lab test that is abnormal or we need to change your treatment, we will call you to review the results.  Testing/Procedures: NONE  Follow-Up: At [facility], you and your health needs are our priority.  As part of our continuing mission to provide you with exceptional heart care, our providers are all part of one team.  This team includes your primary Cardiologist (physician) and Advanced Practice Providers or APPs (Physician Assistants and Nurse Practitioners) who all work together to provide you with the care you need, when you need it.  Your next appointment:   6 month(s)  Provider:   Stanly DELENA Leavens, MD or Jon Hails, PA-C  We recommend signing up for the patient portal called MyChart.  Sign up information is provided on this After Visit Summary.  MyChart is used to connect with patients for Virtual Visits (Telemedicine).  Patients are able to view lab/test results, encounter notes, upcoming appointments, etc.  Non-urgent messages can be sent to your provider as well.   To learn more about  what you can do with MyChart, go to [email or web address].             Signed, Jon Nat Hails, [place]  05/16/2024 10:13 AM    [facility]

## 2024-05-16 ENCOUNTER — Encounter: Payer: Self-pay | Admitting: Physician Assistant

## 2024-05-16 ENCOUNTER — Ambulatory Visit: Attending: Physician Assistant | Admitting: Physician Assistant

## 2024-05-16 VITALS — BP 110/78 | HR 54 | Ht 66.0 in | Wt 189.5 lb

## 2024-05-16 DIAGNOSIS — I739 Peripheral vascular disease, unspecified: Secondary | ICD-10-CM

## 2024-05-16 DIAGNOSIS — Z9861 Coronary angioplasty status: Secondary | ICD-10-CM

## 2024-05-16 DIAGNOSIS — I10 Essential (primary) hypertension: Secondary | ICD-10-CM

## 2024-05-16 DIAGNOSIS — R109 Unspecified abdominal pain: Secondary | ICD-10-CM

## 2024-05-16 DIAGNOSIS — R079 Chest pain, unspecified: Secondary | ICD-10-CM | POA: Diagnosis not present

## 2024-05-16 DIAGNOSIS — E785 Hyperlipidemia, unspecified: Secondary | ICD-10-CM

## 2024-05-16 DIAGNOSIS — I2510 Atherosclerotic heart disease of native coronary artery without angina pectoris: Secondary | ICD-10-CM

## 2024-05-16 DIAGNOSIS — R072 Precordial pain: Secondary | ICD-10-CM

## 2024-05-16 MED ORDER — METOPROLOL SUCCINATE ER 25 MG PO TB24: 12.5000 mg | ORAL_TABLET | Freq: Every day | ORAL | 3 refills | Status: AC

## 2024-05-16 NOTE — Patient Instructions (Addendum)
 Medication Instructions:  STOP BRILINTA   STOP LOPRESSOR   START TOPROL  12.5 MG DAILY   Can try: Magnesium citrate 200 mg coated tablets to help with muscle cramping (side effect would be diarrhea at higher doses) Magnesium L-threonate to help with sleep (take at night)  *If you need a refill on your cardiac medications before your next appointment, please call your pharmacy*  Lab Work: NONE If you have labs (blood work) drawn today and your tests are completely normal, you will receive your results only by: MyChart Message (if you have MyChart) OR A paper copy in the mail If you have any lab test that is abnormal or we need to change your treatment, we will call you to review the results.  Testing/Procedures: NONE  Follow-Up: At [facility], you and your health needs are our priority.  As part of our continuing mission to provide you with exceptional heart care, our providers are all part of one team.  This team includes your primary Cardiologist (physician) and Advanced Practice Providers or APPs (Physician Assistants and Nurse Practitioners) who all work together to provide you with the care you need, when you need it.  Your next appointment:   6 month(s)  Provider:   Stanly DELENA Leavens, MD or Jon Hails, PA-C  We recommend signing up for the patient portal called MyChart.  Sign up information is provided on this After Visit Summary.  MyChart is used to connect with patients for Virtual Visits (Telemedicine).  Patients are able to view lab/test results, encounter notes, upcoming appointments, etc.  Non-urgent messages can be sent to your provider as well.   To learn more about what you can do with MyChart, go to [email or web address].

## 2024-10-02 ENCOUNTER — Encounter: Payer: Self-pay | Admitting: Internal Medicine

## 2024-12-09 ENCOUNTER — Ambulatory Visit: Admitting: Physician Assistant
